# Patient Record
Sex: Female | Born: 1960
Health system: Southern US, Community
[De-identification: ages and names within clinical notes are randomized; demographics above are authoritative.]

## PROBLEM LIST (undated history)

## (undated) DIAGNOSIS — I499 Cardiac arrhythmia, unspecified: Secondary | ICD-10-CM

## (undated) DIAGNOSIS — A048 Other specified bacterial intestinal infections: Secondary | ICD-10-CM

## (undated) DIAGNOSIS — D649 Anemia, unspecified: Secondary | ICD-10-CM

## (undated) DIAGNOSIS — I471 Supraventricular tachycardia, unspecified: Secondary | ICD-10-CM

## (undated) DIAGNOSIS — F32A Depression, unspecified: Secondary | ICD-10-CM

## (undated) DIAGNOSIS — F419 Anxiety disorder, unspecified: Secondary | ICD-10-CM

## (undated) DIAGNOSIS — Z9289 Personal history of other medical treatment: Secondary | ICD-10-CM

## (undated) DIAGNOSIS — I10 Essential (primary) hypertension: Secondary | ICD-10-CM

## (undated) DIAGNOSIS — F329 Major depressive disorder, single episode, unspecified: Secondary | ICD-10-CM

## (undated) DIAGNOSIS — K219 Gastro-esophageal reflux disease without esophagitis: Secondary | ICD-10-CM

## (undated) DIAGNOSIS — M270 Developmental disorders of jaws: Secondary | ICD-10-CM

## (undated) HISTORY — PX: PLEURAL SCARIFICATION: SHX748

## (undated) HISTORY — DX: Personal history of other medical treatment: Z92.89

## (undated) HISTORY — DX: Other specified bacterial intestinal infections: A04.8

## (undated) HISTORY — DX: Anxiety disorder, unspecified: F41.9

## (undated) HISTORY — DX: Cardiac arrhythmia, unspecified: I49.9

## (undated) HISTORY — DX: Essential (primary) hypertension: I10

## (undated) HISTORY — DX: Developmental disorders of jaws: M27.0

## (undated) HISTORY — DX: Gastro-esophageal reflux disease without esophagitis: K21.9

## (undated) HISTORY — DX: Major depressive disorder, single episode, unspecified: F32.9

## (undated) HISTORY — DX: Depression, unspecified: F32.A

## (undated) HISTORY — DX: Anemia, unspecified: D64.9

---

## 1998-02-24 ENCOUNTER — Inpatient Hospital Stay (HOSPITAL_COMMUNITY): Admission: AD | Admit: 1998-02-24 | Discharge: 1998-02-24 | Payer: Self-pay | Admitting: Obstetrics & Gynecology

## 1998-03-31 ENCOUNTER — Ambulatory Visit (HOSPITAL_COMMUNITY): Admission: RE | Admit: 1998-03-31 | Discharge: 1998-03-31 | Payer: Self-pay | Admitting: Obstetrics & Gynecology

## 1998-05-11 ENCOUNTER — Inpatient Hospital Stay (HOSPITAL_COMMUNITY): Admission: AD | Admit: 1998-05-11 | Discharge: 1998-05-14 | Payer: Self-pay | Admitting: Obstetrics and Gynecology

## 1998-05-20 ENCOUNTER — Emergency Department (HOSPITAL_COMMUNITY): Admission: EM | Admit: 1998-05-20 | Discharge: 1998-05-20 | Payer: Self-pay | Admitting: Emergency Medicine

## 1998-05-24 ENCOUNTER — Inpatient Hospital Stay (HOSPITAL_COMMUNITY): Admission: EM | Admit: 1998-05-24 | Discharge: 1998-05-29 | Payer: Self-pay | Admitting: Urology

## 1998-06-12 ENCOUNTER — Emergency Department (HOSPITAL_COMMUNITY): Admission: EM | Admit: 1998-06-12 | Discharge: 1998-06-12 | Payer: Self-pay | Admitting: Emergency Medicine

## 1998-07-28 ENCOUNTER — Ambulatory Visit (HOSPITAL_COMMUNITY): Admission: RE | Admit: 1998-07-28 | Discharge: 1998-07-28 | Payer: Self-pay | Admitting: *Deleted

## 1999-12-20 ENCOUNTER — Ambulatory Visit (HOSPITAL_COMMUNITY): Admission: RE | Admit: 1999-12-20 | Discharge: 1999-12-20 | Payer: Self-pay | Admitting: *Deleted

## 1999-12-20 ENCOUNTER — Encounter: Payer: Self-pay | Admitting: *Deleted

## 2000-03-21 ENCOUNTER — Inpatient Hospital Stay (HOSPITAL_COMMUNITY): Admission: EM | Admit: 2000-03-21 | Discharge: 2000-03-27 | Payer: Self-pay | Admitting: Emergency Medicine

## 2000-03-21 ENCOUNTER — Encounter (INDEPENDENT_AMBULATORY_CARE_PROVIDER_SITE_OTHER): Payer: Self-pay | Admitting: Specialist

## 2000-03-21 ENCOUNTER — Encounter: Payer: Self-pay | Admitting: Emergency Medicine

## 2000-03-22 ENCOUNTER — Encounter: Payer: Self-pay | Admitting: Thoracic Surgery (Cardiothoracic Vascular Surgery)

## 2000-03-23 ENCOUNTER — Encounter: Payer: Self-pay | Admitting: Thoracic Surgery (Cardiothoracic Vascular Surgery)

## 2000-03-24 ENCOUNTER — Encounter: Payer: Self-pay | Admitting: Thoracic Surgery (Cardiothoracic Vascular Surgery)

## 2000-03-25 ENCOUNTER — Encounter: Payer: Self-pay | Admitting: Thoracic Surgery (Cardiothoracic Vascular Surgery)

## 2000-03-26 ENCOUNTER — Encounter: Payer: Self-pay | Admitting: Thoracic Surgery (Cardiothoracic Vascular Surgery)

## 2000-03-27 ENCOUNTER — Encounter: Payer: Self-pay | Admitting: Thoracic Surgery (Cardiothoracic Vascular Surgery)

## 2000-04-09 ENCOUNTER — Encounter: Payer: Self-pay | Admitting: Thoracic Surgery (Cardiothoracic Vascular Surgery)

## 2000-04-09 ENCOUNTER — Encounter
Admission: RE | Admit: 2000-04-09 | Discharge: 2000-04-09 | Payer: Self-pay | Admitting: Thoracic Surgery (Cardiothoracic Vascular Surgery)

## 2000-09-04 ENCOUNTER — Other Ambulatory Visit: Admission: RE | Admit: 2000-09-04 | Discharge: 2000-09-04 | Payer: Self-pay | Admitting: *Deleted

## 2000-09-15 ENCOUNTER — Emergency Department (HOSPITAL_COMMUNITY): Admission: EM | Admit: 2000-09-15 | Discharge: 2000-09-15 | Payer: Self-pay

## 2001-01-25 ENCOUNTER — Other Ambulatory Visit: Admission: RE | Admit: 2001-01-25 | Discharge: 2001-01-25 | Payer: Self-pay | Admitting: *Deleted

## 2001-01-25 ENCOUNTER — Encounter (INDEPENDENT_AMBULATORY_CARE_PROVIDER_SITE_OTHER): Payer: Self-pay | Admitting: Specialist

## 2001-06-12 ENCOUNTER — Observation Stay (HOSPITAL_COMMUNITY): Admission: RE | Admit: 2001-06-12 | Discharge: 2001-06-13 | Payer: Self-pay | Admitting: *Deleted

## 2001-06-12 ENCOUNTER — Encounter (INDEPENDENT_AMBULATORY_CARE_PROVIDER_SITE_OTHER): Payer: Self-pay | Admitting: Specialist

## 2001-08-31 ENCOUNTER — Emergency Department (HOSPITAL_COMMUNITY): Admission: EM | Admit: 2001-08-31 | Discharge: 2001-08-31 | Payer: Self-pay | Admitting: Emergency Medicine

## 2001-09-18 ENCOUNTER — Other Ambulatory Visit: Admission: RE | Admit: 2001-09-18 | Discharge: 2001-09-18 | Payer: Self-pay | Admitting: *Deleted

## 2002-10-20 ENCOUNTER — Emergency Department (HOSPITAL_COMMUNITY): Admission: EM | Admit: 2002-10-20 | Discharge: 2002-10-20 | Payer: Self-pay | Admitting: Emergency Medicine

## 2002-11-19 ENCOUNTER — Encounter: Payer: Self-pay | Admitting: *Deleted

## 2002-11-19 ENCOUNTER — Ambulatory Visit (HOSPITAL_COMMUNITY): Admission: RE | Admit: 2002-11-19 | Discharge: 2002-11-19 | Payer: Self-pay | Admitting: *Deleted

## 2003-01-25 ENCOUNTER — Emergency Department (HOSPITAL_COMMUNITY): Admission: EM | Admit: 2003-01-25 | Discharge: 2003-01-25 | Payer: Self-pay | Admitting: Emergency Medicine

## 2003-12-05 HISTORY — PX: ABDOMINAL HYSTERECTOMY: SHX81

## 2004-01-18 ENCOUNTER — Ambulatory Visit (HOSPITAL_COMMUNITY): Admission: RE | Admit: 2004-01-18 | Discharge: 2004-01-18 | Payer: Self-pay | Admitting: *Deleted

## 2004-04-07 ENCOUNTER — Other Ambulatory Visit: Admission: RE | Admit: 2004-04-07 | Discharge: 2004-04-07 | Payer: Self-pay | Admitting: Family Medicine

## 2004-04-21 ENCOUNTER — Emergency Department (HOSPITAL_COMMUNITY): Admission: EM | Admit: 2004-04-21 | Discharge: 2004-04-22 | Payer: Self-pay | Admitting: Emergency Medicine

## 2004-04-30 ENCOUNTER — Ambulatory Visit (HOSPITAL_COMMUNITY): Admission: RE | Admit: 2004-04-30 | Discharge: 2004-04-30 | Payer: Self-pay | Admitting: Emergency Medicine

## 2004-05-19 ENCOUNTER — Ambulatory Visit (HOSPITAL_COMMUNITY): Admission: RE | Admit: 2004-05-19 | Discharge: 2004-05-19 | Payer: Self-pay | Admitting: Neurosurgery

## 2006-08-15 ENCOUNTER — Ambulatory Visit: Payer: Self-pay | Admitting: Family Medicine

## 2008-01-21 LAB — HM COLONOSCOPY: HM Colonoscopy: NORMAL

## 2008-01-24 ENCOUNTER — Encounter: Admission: RE | Admit: 2008-01-24 | Discharge: 2008-01-24 | Payer: Self-pay | Admitting: Gastroenterology

## 2008-03-04 ENCOUNTER — Other Ambulatory Visit: Admission: RE | Admit: 2008-03-04 | Discharge: 2008-03-04 | Payer: Self-pay | Admitting: Obstetrics and Gynecology

## 2008-08-26 LAB — CONVERTED CEMR LAB: Pap Smear: NORMAL

## 2009-01-21 ENCOUNTER — Ambulatory Visit: Payer: Self-pay | Admitting: *Deleted

## 2009-01-21 DIAGNOSIS — I10 Essential (primary) hypertension: Secondary | ICD-10-CM | POA: Insufficient documentation

## 2009-01-21 DIAGNOSIS — G43009 Migraine without aura, not intractable, without status migrainosus: Secondary | ICD-10-CM

## 2009-01-21 DIAGNOSIS — F411 Generalized anxiety disorder: Secondary | ICD-10-CM | POA: Insufficient documentation

## 2009-01-25 ENCOUNTER — Ambulatory Visit: Payer: Self-pay | Admitting: *Deleted

## 2009-01-25 DIAGNOSIS — J069 Acute upper respiratory infection, unspecified: Secondary | ICD-10-CM | POA: Insufficient documentation

## 2009-01-28 ENCOUNTER — Ambulatory Visit: Payer: Self-pay | Admitting: *Deleted

## 2009-01-28 DIAGNOSIS — M278 Other specified diseases of jaws: Secondary | ICD-10-CM

## 2009-02-03 ENCOUNTER — Ambulatory Visit: Payer: Self-pay | Admitting: *Deleted

## 2009-02-03 DIAGNOSIS — D649 Anemia, unspecified: Secondary | ICD-10-CM | POA: Insufficient documentation

## 2009-02-03 LAB — CONVERTED CEMR LAB
AST: 23 units/L (ref 0–37)
Alkaline Phosphatase: 68 units/L (ref 39–117)
BUN: 10 mg/dL (ref 6–23)
Basophils Relative: 0.5 % (ref 0.0–3.0)
Creatinine, Ser: 0.7 mg/dL (ref 0.4–1.2)
Eosinophils Absolute: 0.1 10*3/uL (ref 0.0–0.7)
Eosinophils Relative: 1.7 % (ref 0.0–5.0)
HCT: 39.7 % (ref 36.0–46.0)
HDL: 51.8 mg/dL (ref >39.0)
MCV: 79.1 fL (ref 78.0–100.0)
Monocytes Absolute: 0.4 10*3/uL (ref 0.1–1.0)
Platelets: 294 10*3/uL (ref 150–400)
RBC: 5.02 M/uL (ref 3.87–5.11)
TSH: 0.44 microintl units/mL (ref 0.35–5.50)
WBC: 7.4 10*3/uL (ref 4.5–10.5)

## 2009-03-08 ENCOUNTER — Other Ambulatory Visit: Admission: RE | Admit: 2009-03-08 | Discharge: 2009-03-08 | Payer: Self-pay | Admitting: Obstetrics and Gynecology

## 2009-03-08 ENCOUNTER — Encounter: Payer: Self-pay | Admitting: Women's Health

## 2009-03-08 ENCOUNTER — Ambulatory Visit: Payer: Self-pay | Admitting: Women's Health

## 2009-03-24 ENCOUNTER — Encounter: Payer: Self-pay | Admitting: Internal Medicine

## 2009-04-06 ENCOUNTER — Telehealth: Payer: Self-pay | Admitting: Internal Medicine

## 2009-11-17 ENCOUNTER — Ambulatory Visit: Payer: Self-pay | Admitting: Family

## 2009-11-17 DIAGNOSIS — R300 Dysuria: Secondary | ICD-10-CM

## 2009-11-17 DIAGNOSIS — F329 Major depressive disorder, single episode, unspecified: Secondary | ICD-10-CM

## 2009-11-19 ENCOUNTER — Encounter: Payer: Self-pay | Admitting: Family

## 2009-11-23 ENCOUNTER — Ambulatory Visit (HOSPITAL_BASED_OUTPATIENT_CLINIC_OR_DEPARTMENT_OTHER): Admission: RE | Admit: 2009-11-23 | Discharge: 2009-11-23 | Payer: Self-pay | Admitting: Internal Medicine

## 2009-11-23 ENCOUNTER — Ambulatory Visit: Payer: Self-pay | Admitting: Radiology

## 2010-01-19 ENCOUNTER — Ambulatory Visit: Payer: Self-pay | Admitting: Family

## 2010-01-19 ENCOUNTER — Telehealth: Payer: Self-pay | Admitting: Family

## 2010-01-19 LAB — CONVERTED CEMR LAB
Basophils Absolute: 0 10*3/uL (ref 0.0–0.1)
Cholesterol: 191 mg/dL (ref 0–200)
Glucose, Bld: 82 mg/dL (ref 70–99)
HDL: 58 mg/dL (ref >39)
Hemoglobin: 13.3 g/dL (ref 12.0–15.0)
Lymphocytes Relative: 23 % (ref 12–46)
Monocytes Absolute: 0.4 10*3/uL (ref 0.1–1.0)
Neutro Abs: 5 10*3/uL (ref 1.7–7.7)
Potassium: 4.3 meq/L (ref 3.5–5.3)
RBC: 5.3 M/uL — ABNORMAL HIGH (ref 3.87–5.11)
RDW: 15.9 % — ABNORMAL HIGH (ref 11.5–15.5)
Sodium: 141 meq/L (ref 135–145)
Total CHOL/HDL Ratio: 3.3
Triglycerides: 46 mg/dL (ref <150)
VLDL: 9 mg/dL (ref 0–40)

## 2010-01-20 ENCOUNTER — Encounter: Payer: Self-pay | Admitting: Family

## 2010-01-20 LAB — CONVERTED CEMR LAB: UIBC: 248 ug/dL

## 2010-01-21 ENCOUNTER — Encounter: Payer: Self-pay | Admitting: Internal Medicine

## 2010-01-21 ENCOUNTER — Ambulatory Visit: Payer: Self-pay | Admitting: Family

## 2010-02-28 ENCOUNTER — Telehealth: Payer: Self-pay | Admitting: Family

## 2010-04-01 ENCOUNTER — Encounter: Payer: Self-pay | Admitting: Family

## 2010-04-01 ENCOUNTER — Telehealth: Payer: Self-pay | Admitting: Family

## 2010-04-15 ENCOUNTER — Ambulatory Visit: Payer: Self-pay | Admitting: Family

## 2010-07-07 ENCOUNTER — Telehealth: Payer: Self-pay | Admitting: Family

## 2010-08-26 ENCOUNTER — Telehealth: Payer: Self-pay | Admitting: Family

## 2010-12-27 ENCOUNTER — Ambulatory Visit (INDEPENDENT_AMBULATORY_CARE_PROVIDER_SITE_OTHER)
Admission: RE | Admit: 2010-12-27 | Discharge: 2010-12-27 | Payer: PRIVATE HEALTH INSURANCE | Source: Home / Self Care | Attending: Internal Medicine | Admitting: Internal Medicine

## 2010-12-27 DIAGNOSIS — Z1231 Encounter for screening mammogram for malignant neoplasm of breast: Secondary | ICD-10-CM

## 2010-12-27 LAB — HM MAMMOGRAPHY: HM Mammogram: NORMAL

## 2011-01-01 LAB — CONVERTED CEMR LAB
Bilirubin Urine: NEGATIVE
Ketones, urine, test strip: NEGATIVE
pH: 6.5

## 2011-01-05 NOTE — Assessment & Plan Note (Signed)
Summary: t b test/mhf  Nurse Visit   Allergies: No Known Drug Allergies  Immunizations Administered:  PPD Skin Test:    Vaccine Type: PPD    Site: left forearm    Mfr: Sanofi Pasteur    Dose: 0.1 ml    Route: ID    Given by: Glendell Docker CMA    Exp. Date: 02/09/2012    Lot #: [id number]  Orders Added: 1)  TB Skin Test [86580] 2)  Admin 1st Vaccine [phone]

## 2011-01-05 NOTE — Letter (Signed)
   Adult nurse [facility]Care at [facility] [address] [place], [place] [address]     January 20, 2010   [facility] [address] [place], [place] [address]  RE:  LAB RESULTS  Dear  Ms. SHARPE,  The following is an interpretation of your most recent lab tests.  Please take note of any instructions provided or changes to medications that have resulted from your lab work.  ELECTROLYTES:  Good - no changes needed  KIDNEY FUNCTION TESTS:  Good - no changes needed    CBC:  Stable - no changes needed Your iron may be low based on your blood work.  I am running some additional tests to check.   Sincerely Yours,    Lemont Fillers FNP

## 2011-01-05 NOTE — Letter (Signed)
Summary: Generic Letter  [place] [facility]  [address]   [place], [place] [address]   Phone: [phone]  Fax: [phone]      04/01/2010    [facility] [address] [place], [place]  [address]    Dear [name],  We recently received a refill request from your pharmacy for Bystolic. After reviewing your records it was noted that you have not followed up in the office as I requested when I saw you in December. We will be unable to refill your medication until you can be seen in the office. Keeping regularly scheduled appointments is an important step in managing your hypertension.  Please call our office at [phone] to schedule your appointment today.  Sincerely,    Sandford Craze, NP

## 2011-01-05 NOTE — Letter (Signed)
Summary: Physical Exam Form/[employer]  Physical Exam Form/[employer]   Imported By: Lanelle Bal 01/25/2010 12:35:34  _____________________________________________________________________  External Attachment:    Type:   Image     Comment:   External Document

## 2011-01-05 NOTE — Progress Notes (Signed)
Summary: samples  Phone Note Call from Patient Call back at [phone]   Caller: Patient Call For: osullivan Summary of Call: patient needs samples of bystolic 20 mg or 10 mg  Initial call taken by: Roselle Locus,  August 26, 2010 11:09 AM  Follow-up for Phone Call        samples left at front desk fo 10mg , #28.  Also provided coupon card.  Patient was contacted and notified that samples and card left at front desk- patient to take two tabs. Follow-up by: Lemont Fillers FNP,  August 26, 2010 12:23 PM

## 2011-01-05 NOTE — Progress Notes (Signed)
Summary: refill request  Phone Note Refill Request Message from:  Fax from Pharmacy on February 28, 2010 8:09 AM  Refills Requested: Medication #1:  BYSTOLIC 20 MG TABS one tablet by mouth daily.   Dosage confirmed as above?Dosage Confirmed   Brand Name Necessary? No   Supply Requested: 1 month   Last Refilled: 01/23/2010 [facility] [address] [place] [address] phone [phone] fax [phone]   Method Requested: Electronic Next Appointment Scheduled: none Initial call taken by: Roselle Locus,  February 28, 2010 8:10 AM  Follow-up for Phone Call        Pt. was supposed to have followed up with [facility] in January but did not. Refilled Bystolic #30 x no refills. Attempted to contact pt. but number is not working. Note sent to pharmacy that pt. must make appt. before further refills can be given. Nicki Guadalajara Fergerson CMA  February 28, 2010 8:39 AM     Prescriptions: BYSTOLIC 20 MG TABS (NEBIVOLOL HCL) one tablet by mouth daily  #30 x 0   Entered by:   Mervin Kung CMA   Authorized by:   Lemont Fillers FNP   Signed by:   Mervin Kung CMA on 02/28/2010   Method used:   Electronically to        [id number] (retail)       [address] [place], [place]  [address]       Ph: [phone]       Fax: [phone]   RxID:   [id number]

## 2011-01-05 NOTE — Progress Notes (Signed)
Summary: Bystolic samples  Phone Note Call from Patient Call back at [phone]   Caller: Patient Summary of Call: pt wants to know if we have any samples of Bystolic 20MG ,  pls call Initial call taken by: Lannette Donath,  July 07, 2010 4:36 PM  Follow-up for Phone Call        Pt advised we are out of 20mg  strength. I will leave 10mg  strength at front desk, pt advised to take 2 daily to equal 20mg .  Pt voices understanding.  #35 samples given Lot [id number] 10/13.  Nicki Guadalajara Fergerson CMA (AAMA)  July 08, 2010 11:01 AM

## 2011-01-05 NOTE — Assessment & Plan Note (Signed)
Summary: READ TB TEST/MHF  Nurse Visit   Primary Care Provider:  Paulo Fruit MD  CC:  TB Skin test recheck.  History of Present Illness: The patient presented after 48 hours to check  the injection site for postive or negative reaction.  Injection site examination : No firm bump frms at the test site. Slighty reddish appearance and diameter was smaller than 5mm.  Assessment & Plan: Negative TB skin test. Patient was counseled to call if experienceany irritation of site.     Allergies: No Known Drug Allergies  PPD Results    Date of reading: 01/21/2010    Results: < 5mm    Interpretation: negative   Appended Document: Tetanus    Clinical Lists Changes  Orders: Added new Service order of Tdap => 91yrs IM ([address]) - Signed Added new Service order of Admin 1st Vaccine ([address]) - Signed Observations: Added new observation of TD BOOST VIS: 10/22/07 version given January 21, 2010. (01/21/2010 10:50) Added new observation of TD BOOSTERLO: [id number] (01/21/2010 10:50) Added new observation of TD BOOST EXP: 01/29/2012 (01/21/2010 10:50) Added new observation of TD BOOSTERBY: Mervin Kung, CMA (01/21/2010 10:50) Added new observation of TD BOOSTERRT: IM (01/21/2010 10:50) Added new observation of TDBOOSTERDSE: 0.5 ml (01/21/2010 10:50) Added new observation of TD BOOSTERMF: GlaxoSmithKline (01/21/2010 10:50) Added new observation of TD BOOST SIT: left deltoid (01/21/2010 10:50) Added new observation of TD BOOSTER: Tdap (01/21/2010 10:50)       Immunizations Administered:  Tetanus Vaccine:    Vaccine Type: Tdap    Site: left deltoid    Mfr: GlaxoSmithKline    Dose: 0.5 ml    Route: IM    Given by: Mervin Kung, CMA    Exp. Date: 01/29/2012    Lot #: [id number]    VIS given: 10/22/07 version given January 21, 2010.

## 2011-01-05 NOTE — Progress Notes (Signed)
  Phone Note Outgoing Call   Summary of Call: Please call patient and let her know that in order to complete form she will need to return for a nurse visit for hearing, vision, PPD and possibly tetanus- we need to verify date of last dose. Thanks. Initial call taken by: Lemont Fillers FNP,  January 19, 2010 4:38 PM  Follow-up for Phone Call        patient had ppd done 01/19/10 and will be back to have it read tomorrow so will do the others at this time....Marland KitchenMarland KitchenDoristine Devoid  January 20, 2010 2:35 PM

## 2011-01-05 NOTE — Progress Notes (Signed)
Summary: refill request-- Bystolic  Phone Note Refill Request Message from:  Pharmacy on April 01, 2010 11:14 AM  Refills Requested: Medication #1:  BYSTOLIC 20 MG TABS one tablet by mouth daily.   Dosage confirmed as above?Dosage Confirmed   Supply Requested: 1 month   Last Refilled: 02/28/2010 Spoke to [place] at [facility] and denied refill as pt. needs to contact our office for an appointment. Unable to reach pt. at contact numbers listed.   Mervin Kung CMA  April 01, 2010 11:17 AM   Next Appointment Scheduled: no appt. on file  Follow-up for Phone Call        Pls send patient a letter instructing her to arrange a follow up visit. Thanks Follow-up by: Lemont Fillers FNP,  April 01, 2010 11:23 AM  Additional Follow-up for Phone Call Additional follow up Details #1::        Letter mailed to pt.  Mervin Kung CMA  April 01, 2010 3:08 PM

## 2011-01-05 NOTE — Assessment & Plan Note (Signed)
Summary: b p med refill/mhf   Vital Signs:  Patient profile:   50 year old female Menstrual status:  hysterectomy Height:      70 inches Weight:      159.50 pounds BMI:     22.97 Temp:     98.2 degrees F oral Pulse rate:   66 / minute Pulse rhythm:   regular Resp:     16 per minute BP sitting:   110 / 78  (right arm) Cuff size:   regular  Vitals Entered By: Mervin Kung CMA (Apr 15, 2010 10:10 AM) CC: room 5  follow up of blood pressure. Is Patient Diabetic? No   Primary Care Provider:  Paulo Fruit MD  CC:  room 5  follow up of blood pressure.Marland Kitchen  History of Present Illness: [name] is a 50 year old female who presents today for follow up of her HTN.  She denies current headache or swelling.  Reports that her Bystolic ran out and she was out for approximately 1 week.  Has not been following up due to lack of insurance.  She went to the Urgent care to obtain rx for Bystolic.  She has no specific complaints.  Allergies (verified): No Known Drug Allergies  Physical Exam  General:  Well-developed,well-nourished,in no acute distress; alert,appropriate and cooperative throughout examination Lungs:  Normal respiratory effort, chest expands symmetrically. Lungs are clear to auscultation, no crackles or wheezes. Heart:  Normal rate and regular rhythm. S1 and S2 normal without gallop, murmur, click, rub or other extra sounds. Extremities:  No clubbing, cyanosis, edema, or deformity noted    Impression & Recommendations:  Problem # 1:  HYPERTENSION (ICD-401.9) Assessment Improved  samples given [id number] exp 08/13 #28, BP stable, plan follow up in 6 months. Her updated medication list for this problem includes:    Bystolic 20 Mg Tabs (Nebivolol hcl) ..... One tablet by mouth daily  BP today: 110/78 Prior BP: 130/90 (11/17/2009)  Labs Reviewed: K+: 4.3 (01/19/2010) Creat: : 0.82 (01/19/2010)   Chol: 191 (01/19/2010)   HDL: 58 (01/19/2010)   LDL: 124 (01/19/2010)   TG:  46 (01/19/2010)  Complete Medication List: 1)  Bystolic 20 Mg Tabs (Nebivolol hcl) .... One tablet by mouth daily  Patient Instructions: 1)  Please follow up in 6 months Prescriptions: BYSTOLIC 20 MG TABS (NEBIVOLOL HCL) one tablet by mouth daily  #30 x 5   Entered and Authorized by:   Lemont Fillers FNP   Signed by:   Lemont Fillers FNP on 04/15/2010   Method used:   Electronically to        [id number]* (retail)       [address] [place], [place]  [address]       Ph: [phone]       Fax: [phone]   RxID:   [id number]   Current Allergies (reviewed today): No known allergies

## 2011-01-26 ENCOUNTER — Telehealth: Payer: Self-pay | Admitting: Family

## 2011-01-31 NOTE — Progress Notes (Signed)
Summary: refill-bystolic  Phone Note Refill Request Message from:  Fax from Pharmacy on January 26, 2011 8:58 AM  Refills Requested: Medication #1:  BYSTOLIC 20 MG TABS one tablet by mouth daily.   Dosage confirmed as above?Dosage Confirmed   Brand Name Necessary? No   Supply Requested: 1 month   Last [facility]: [facility] [address] [place] [address] fax [phone]   Method Requested: Electronic Next Appointment Scheduled: none Initial call taken by: Elba Barman,  January 26, 2011 9:03 AM  Follow-up for Phone Call        Pt is past due for follow up. Attempted to reach pt and received recording that voice mail box has not been set up. Notified Sheria Lang at [facility] that rx has been denied, have pt contact our office. Pt needs follow up, can give 30 day supply only once pt schedules appt.  Follow-up by: Mervin Kung CMA Duncan Dull),  January 26, 2011 9:52 AM

## 2011-02-01 ENCOUNTER — Telehealth: Payer: Self-pay | Admitting: Family

## 2011-02-02 ENCOUNTER — Encounter: Payer: Self-pay | Admitting: Internal Medicine

## 2011-02-02 ENCOUNTER — Ambulatory Visit (INDEPENDENT_AMBULATORY_CARE_PROVIDER_SITE_OTHER): Payer: PRIVATE HEALTH INSURANCE | Admitting: Internal Medicine

## 2011-02-02 DIAGNOSIS — J069 Acute upper respiratory infection, unspecified: Secondary | ICD-10-CM

## 2011-02-02 DIAGNOSIS — I10 Essential (primary) hypertension: Secondary | ICD-10-CM

## 2011-02-09 NOTE — Progress Notes (Signed)
Summary: congestion, needs appt.  Phone Note Call from Patient Call back at [phone]--Melvin   Caller: Spouse Call For: Lemont Fillers FNP Summary of Call: Received call from pt's husband stating that pt has head congestion, cough and head pressure. Wanted to know what to try OTC. Advised him pt would need to make an appointment (pt past due for f/u). He states he will let pt know when he gets home. Nicki Guadalajara Fergerson CMA Duncan Dull)  February 01, 2011 1:43 PM

## 2011-02-09 NOTE — Assessment & Plan Note (Signed)
Summary: cough/head pressure/head congestion/ss--rm 3   Vital Signs:  Patient profile:   50 year old female Menstrual status:  hysterectomy Height:      70 inches Weight:      161 pounds BMI:     23.18 O2 Sat:      98 % on Room air Temp:     98.6 degrees F oral Pulse rate:   72 / minute Pulse rhythm:   regular Resp:     16 per minute BP sitting:   108 / 80  (right arm) Cuff size:   regular  Vitals Entered By: Mervin Kung CMA Duncan Dull) (February 02, 2011 10:07 AM)  O2 Flow:  Room air CC: Pt states she has had dry cough, head congestion and dizziness since Monday. Is Patient Diabetic? No Pain Assessment Patient in pain? no      Comments Pt out of Bystolic x 2 days, needs refill. Has tried Nyquil with minimal relief. Nicki Guadalajara Fergerson CMA Duncan Dull)  February 02, 2011 10:14 AM    Primary Care Provider:  Lemont Fillers FNP  CC:  Pt states she has had dry cough and head congestion and dizziness since Monday.Marland Kitchen  History of Present Illness: 50 y/o female c/o URI symptoms started with itchy throat head congestion significant fatigue 2 days ago - felt chills some cough - due to itch in the throat no ear pain no SOB  Preventive Screening-Counseling & Management  Alcohol-Tobacco     Smoking Status: never     Passive Smoke Exposure: no  Allergies (verified): No Known Drug Allergies  Past History:  Past Medical History: migraines  sees gyn Hypertension irregular heart rhythm - unclear what arrhythmia  pneumothorax palatal torus Depression  Urninary tract infections  Past Surgical History: c-section x 2    pneumothorax Hysterectomy 2005    Family History: Family History of Arthritis    Family History of  of CAD    Family History of  stroke    Family History of  emotional/mental illness  Mom-depression Dad-hypertension   Social History: Social History:    Occupation:not currently work    Married    2 children 11 and 14     Never Smoked    Alcohol  use-no  Physical Exam  General:  alert, well-developed, and well-nourished.   Ears:  right cerumen,  Left ear normal Mouth:  no exudates and pharyngeal erythema.   Neck:  No deformities, masses, or tenderness noted. Lungs:  normal respiratory effort, normal breath sounds, no crackles, and no wheezes.   Heart:  normal rate, regular rhythm, and no gallop.     Impression & Recommendations:  Problem # 1:  URI (ICD-465.9)  Instructed on symptomatic treatment. Call if symptoms persist or worsen.   Problem # 2:  HYPERTENSION (ICD-401.9) pt has not taken bp meds x 2 days bp still normal.    we discussed how bp may be lower due to recent URI hold bystolic for additional 24 hrs increase fluids monitor bp at home once bp returns to baseline, pt to restart bystolic but at 1/2 dose  Her updated medication list for this problem includes:    Bystolic 20 Mg Tabs (Nebivolol hcl) ..... One half tablet by mouth daily  BP today: 108/80 Prior BP: 110/78 (04/15/2010)  Labs Reviewed: K+: 4.3 (01/19/2010) Creat: : 0.82 (01/19/2010)   Chol: 191 (01/19/2010)   HDL: 58 (01/19/2010)   LDL: 124 (01/19/2010)   TG: 46 (01/19/2010)  Complete Medication List: 1)  Bystolic 20 Mg Tabs (Nebivolol hcl) .... One half tablet by mouth daily  Patient Instructions: 1)  Call our office if your symptoms do not  improve or gets worse. 2)  Please follow up with Melissa within 3-6 months 3)  Increase fluids Prescriptions: BYSTOLIC 20 MG TABS (NEBIVOLOL HCL) one tablet by mouth daily  #30 x 5   Entered and Authorized by:   D. Thomos Lemons DO   Signed by:   D. Thomos Lemons DO on 02/02/2011   Method used:   Electronically to        [id number] (retail)       [address] [place], [place]  [address]       Ph: [phone]       Fax: [phone]   RxID:   [id number]    Orders Added: 1)  Est. Patient Level III [[address]]    Current Allergies (reviewed today): No known  allergies

## 2011-02-22 ENCOUNTER — Encounter: Payer: Self-pay | Admitting: Family

## 2011-02-24 ENCOUNTER — Encounter: Payer: Self-pay | Admitting: Family

## 2011-02-24 ENCOUNTER — Ambulatory Visit (INDEPENDENT_AMBULATORY_CARE_PROVIDER_SITE_OTHER): Payer: PRIVATE HEALTH INSURANCE | Admitting: Family

## 2011-02-24 DIAGNOSIS — I10 Essential (primary) hypertension: Secondary | ICD-10-CM

## 2011-02-24 NOTE — Progress Notes (Signed)
  Subjective:    Patient ID: [name], female    DOB: [birth year], 50 y.o.   MRN: [id number]  HPI Patient is a 50 year old female who presents today for followup of her hypertension. Last visit she was noted to have low blood pressure. This was during an acute illness and she was not taking her systolic at that time. It was recommended that she monitor her blood pressure and increase her diastolic to half of the dose which was 10 mg. She notes that she discontinued off of the basilic and her blood pressure has remained stable. She brings with her today a blood pressure log.  Her readings since March 18 note systolic pressure ranging from 94-132 and diastolic pressure ranging from 68-89.    Review of Systems  Cardiovascular: Positive for palpitations. Negative for leg swelling.  Neurological: Positive for light-headedness.       Occurs only when blood pressure is low.   Past Medical History  Diagnosis Date  . Hypertension   . Depression   . Migraine   . Irregular heart rhythm     unclear what arrhythmia  . Pneumothorax   . Torus palatinus   . UTI (urinary tract infection)     History   Social History  . Marital Status: Married    Spouse Name: N/A    Number of Children: 2  . Years of Education: N/A   Occupational History  . SALES    Social History Main Topics  . Smoking status: Never Smoker   . Smokeless tobacco: Never Used  . Alcohol Use: No  . Drug Use: Not on file  . Sexually Active: Not on file   Other Topics Concern  . Not on file   Social History Narrative  . No narrative on file    Past Surgical History  Procedure Date  . Cesarean section     x 2  . Abdominal hysterectomy 2005  . Pleural scarification     Family History  Problem Relation Age of Onset  . Depression Mother   . Hypertension Father   . Arthritis Other   . Coronary artery disease Other   . Stroke Other   . Mental illness Other     Allergies no known allergies  No current  outpatient prescriptions on file prior to visit.    BP 140/84  Pulse 62  Temp(Src) 98.2 F (36.8 C) (Oral)  Resp 14  Ht 5\' 10"  (1.778 m)  Wt 161 lb (73.029 kg)  BMI 23.10 kg/m2       Objective:   Physical Exam  Constitutional: She appears well-developed and well-nourished.  Cardiovascular: Normal rate and regular rhythm.   Pulmonary/Chest: No respiratory distress. She has no wheezes. She has no rales.          Assessment & Plan:

## 2011-02-24 NOTE — Patient Instructions (Signed)
Please continue to hold the Bystolic.   Arrange a follow up appointment with Dr. Lynnea Ferrier (cardiolgy). Call if your blood pressure runs consistently > 140/90.

## 2011-02-24 NOTE — Assessment & Plan Note (Signed)
Patient's blood pressure appears to be well controlled off of medications. I have recommended that she continue to monitor her blood pressure regularly at home and call us if her blood pressure is regularly running graded 140/90. She does tell me that her cardiologist Dr. Lynnea Ferrier and initiated this medicine. She has a history of palpitations. She does note that they happen on occasion but this has not been a part of particular concern to her. I did recommend that she call Dr. Lynnea Ferrier to arrange a followup visit to discuss ongoing discontinuation of her beta blocker. She is agreeable to do so.

## 2011-03-24 ENCOUNTER — Emergency Department (HOSPITAL_COMMUNITY): Payer: PRIVATE HEALTH INSURANCE

## 2011-03-24 ENCOUNTER — Emergency Department (HOSPITAL_COMMUNITY)
Admission: EM | Admit: 2011-03-24 | Discharge: 2011-03-24 | Disposition: A | Discharge status: Home / Self Care | Payer: PRIVATE HEALTH INSURANCE | Attending: Emergency Medicine | Admitting: Emergency Medicine

## 2011-03-24 DIAGNOSIS — R0609 Other forms of dyspnea: Secondary | ICD-10-CM | POA: Insufficient documentation

## 2011-03-24 DIAGNOSIS — I498 Other specified cardiac arrhythmias: Secondary | ICD-10-CM | POA: Insufficient documentation

## 2011-03-24 DIAGNOSIS — I10 Essential (primary) hypertension: Secondary | ICD-10-CM | POA: Insufficient documentation

## 2011-03-24 DIAGNOSIS — R110 Nausea: Secondary | ICD-10-CM | POA: Insufficient documentation

## 2011-03-24 DIAGNOSIS — R5381 Other malaise: Secondary | ICD-10-CM | POA: Insufficient documentation

## 2011-03-24 DIAGNOSIS — R42 Dizziness and giddiness: Secondary | ICD-10-CM | POA: Insufficient documentation

## 2011-03-24 DIAGNOSIS — R002 Palpitations: Secondary | ICD-10-CM | POA: Insufficient documentation

## 2011-03-24 DIAGNOSIS — R0989 Other specified symptoms and signs involving the circulatory and respiratory systems: Secondary | ICD-10-CM | POA: Insufficient documentation

## 2011-03-24 LAB — CK TOTAL AND CKMB (NOT AT ARMC)
CK, MB: 3.3 ng/mL (ref 0.3–4.0)
Relative Index: 1.7 (ref 0.0–2.5)

## 2011-03-24 LAB — CBC
HCT: 38.2 % (ref 36.0–46.0)
Hemoglobin: 13.4 g/dL (ref 12.0–15.0)
MCH: 25.7 pg — ABNORMAL LOW (ref 26.0–34.0)
MCHC: 35.1 g/dL (ref 30.0–36.0)
RBC: 5.22 MIL/uL — ABNORMAL HIGH (ref 3.87–5.11)

## 2011-03-24 LAB — DIFFERENTIAL
Blasts: 0 %
Lymphocytes Relative: 57 % — ABNORMAL HIGH (ref 12–46)
Lymphs Abs: 3.7 10*3/uL (ref 0.7–4.0)
Monocytes Absolute: 0.3 10*3/uL (ref 0.1–1.0)
Monocytes Relative: 5 % (ref 3–12)
Neutro Abs: 2.1 10*3/uL (ref 1.7–7.7)
Neutrophils Relative %: 34 % — ABNORMAL LOW (ref 43–77)
Promyelocytes Absolute: 0 %
nRBC: 0 /100 WBC

## 2011-03-24 LAB — BASIC METABOLIC PANEL
CO2: 25 mEq/L (ref 19–32)
Calcium: 9.6 mg/dL (ref 8.4–10.5)
GFR calc Af Amer: >60 mL/min (ref >60)
GFR calc non Af Amer: >60 mL/min (ref >60)
Glucose, Bld: 140 mg/dL — ABNORMAL HIGH (ref 70–99)
Potassium: 3.6 mEq/L (ref 3.5–5.1)
Sodium: 143 mEq/L (ref 135–145)

## 2011-03-24 LAB — TSH: TSH: 0.833 u[IU]/mL (ref 0.350–4.500)

## 2011-03-24 LAB — TROPONIN I: Troponin I: 0.01 ng/mL (ref 0.00–0.06)

## 2011-04-11 NOTE — H&P (Signed)
[name], [name]              ACCOUNT NO.:  [id number]  MEDICAL RECORD NO.:  [id number]           PATIENT TYPE:  E  LOCATION:  [facility]                         FACILITY:  [facility]  PHYSICIAN:  Nanetta Batty, M.D.   DATE OF BIRTH:  12-Jun-1961  DATE OF ADMISSION:  03/24/2011 DATE OF DISCHARGE:  03/24/2011                             HISTORY & PHYSICAL   CHIEF COMPLAINTS:  Tachycardia.  HISTORY OF PRESENT ILLNESS:  [name] is a pleasant 50 year old female who was seen by our group in the past.  She has had a history of chest pain with an abnormal EKG but has had a negative Myoview in 2009 and an essentially normal echocardiogram.  She does have hypertension.  I last saw her in April of 2010.  She had been on Bystolic 10 mg.  Apparently her primary care doctor had increased her Bystolic to 20 mg but then she had problems with hypotension and eventually I believe her Bystolic was stopped about a month ago.  Today, the patient was driving and had a sudden onset of tachycardia associated with dizziness.  In the emergency room, she was noted to be in SVT.  She was given Adenocard which ultimately converted her to sinus rhythm.  She is currently symptom free and maintaining sinus rhythm.  PAST MEDICAL HISTORY:  Remarkable for prior pneumothorax remotely.  She has hypertension as noted.  She has had endometriosis and fibroids.  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  She said she was sent to [facility] for this and did not require surgery.  CURRENT MEDICATIONS:  None.  ALLERGIES:  She has no known drug allergies.  SOCIAL HISTORY:  She is married.  She has 2 boys.  She works as a Scientist, physiological.  She does not smoke or drink and has only a cup of coffee a day.  FAMILY HISTORY:  Remarkable that her father had coronary artery disease.  REVIEW OF SYSTEMS:  Essentially unremarkable except for above.  She denies any chest pain or shortness  of breath.  She has not had syncope.  PHYSICAL EXAMINATION:  VITAL SIGNS:  In the emergency room, vital signs were blood pressure 153/108, pulse 80, respirations 12. GENERAL:  In general, she is a well-developed, well-nourished African- American female, in no acute distress. HEENT:  Normocephalic.  Extraocular movements are intact.  Sclerae nonicteric.  Lids and conjunctivae within normal limits. NECK:  Without JVD or bruit. CHEST:  Clear to auscultation and percussion. CARDIAC:  Reveals regular rate and rhythm without murmur, rub, or gallop.  Normal S1, S2. ABDOMEN:  Nontender, nondistended. EXTREMITIES:  Without edema.  Distal pulses are intact. NEURO EXAM:  Grossly intact.  She is awake, alert, oriented, cooperative.  Moves all extremities without obvious deficit. SKIN:  Cool and dry.  LABORATORY DATA:  Sodium 143, potassium 3.6, BUN 11, creatinine 0.8, troponin is negative.  BNP is 44.  White count 6.3, hemoglobin 13.3, hematocrit 38.2, platelets 300.  EKG shows sinus rhythm with PVCs, she has T-wave inversion in leads II, III and aVF and a biphasic T-wave in  V5 and T-wave inversion in V6, and it also appears that she has incomplete right bundle branch block.  IMPRESSION: 1. Paroxysmal supraventricular tachycardia, broken with an adenosine. 2. Abnormal EKG with negative Myoview in 2009 and normal left     ventricular function by echocardiogram in 2009. 3. Treated hypertension, recently taken off her beta-blocker.  PLAN:  The patient was seen by Dr. Allyson Sabal and myself in the emergency room.  We restarted her by Bystolic at 10 mg a day.  We will see her back in about 2 weeks in the office in followup.  At some point, she will need a TSH, this could be done as an outpatient.     Abelino Derrick, P.A.   ______________________________ Nanetta Batty, M.D.    Lenard Lance  D:  03/24/2011  T:  03/24/2011  Job:  [id number]  cc:   Nanetta Batty, M.D. Fax:  [phone]  Electronically Signed by Corine Shelter P.A. on 04/05/2011 04:36:51 PM Electronically Signed by Nanetta Batty M.D. on 04/11/2011 07:38:54 AM

## 2011-04-21 NOTE — Op Note (Signed)
[facility]  Patient:    [name], [name]                MRN: [id number] Proc. Date: 06/12/01 Attending:  Almedia Balls. Randell Patient, M.D. CC:         Harl Bowie, M.D.   Operative Report  PREOPERATIVE DIAGNOSES: 1. History of endometriosis. 2. Uterine fibroids. 3. Abnormal uterine bleeding. 4. Catamenial pneumothorax. 5. Pelvic pain.  POSTOPERATIVE DIAGNOSES: 1. History of endometriosis. 2. Uterine fibroids. 3. Abnormal uterine bleeding. 4. Catamenial pneumothorax. 5. Pelvic pain. 6. Pending pathology.  OPERATION: 1. Abdominal supracervical hysterectomy. 2. Bilateral salpingo-oophorectomy. 3. Extensive lysis of adhesions.  SURGEON:  Almedia Balls. Randell Patient, M.D.  ASSISTANT:  Harl Bowie, M.D.  ANESTHESIA:  General endotracheal anesthesia.  INDICATIONS:  The patient is a 50 year old with the above-noted problems who was counselled as to the need for surgery to treat these problems.  She was fully counselled as to the nature of the procedure and the risks involved including the risks of anesthesia, injury to bowel, bladder, blood vessels, ureters, postoperative hemorrhage, infection, recuperation, and use of hormone replacement/suppression for endometriosis following surgery.  OPERATIVE FINDINGS:  On entry into the peritoneal cavity, there were adhesions involving the uterus to the anterior peritoneal surface.  Loops of bowel were quite adherent to the left adnexal structures.  The uterus itself was adherent with a very dense adhesion to the bladder reflection.  Palpation of the upper viscera was limited by some adhesions, but the liver area of the gallbladder, spleen, kidneys, periaortic areas were normal to palpation.  DESCRIPTION OF PROCEDURE:     With the patient under general anesthesia, prepared and draped in the usual sterile fashion, with Foley catheter in the bladder, a lower abdominal transverse incision was made and carried into  the peritoneal cavity without difficulty.  The adhesions involving the uterus and the anterior peritoneal surface were lysed using Bovie electrocoagulation. Other adhesions involving loops of bowel on the posterior surface of the uterus and left adnexal area were lysed using Bovie electrocoagulation and sharp dissection.  It was possible to place a Kelly clamp on the uterine ovarian anastomosis on the right; however on the left, because of the adhesion, it was not possible to do this.  The adhesion anteriorly was lysed with very careful dissection sharply.  Small bleeders were rendered hemostatic with Bovie electrocoagulation.  A suture ligature of 3-0 chromic catgut was necessary in the midline because of the large vessel in this area.  Suture ligatures were then placed on the round ligaments bilaterally with transection of these structures using Bovie electrocoagulation.  Entry into the retroperitoneal space was carried out.  The left tube and ovary were quite adherent to the lateral peritoneal surface, making dissection at this time impossible because of the size of the uterus.  The right infundibulopelvic ligament was then isolated, clamped, cut, and doubly ligated with #1 chromic catgut.  The uterine vessels were then skeletonized on the right, clamped, cut, and suture ligated with #1 chronic catgut.  Cardinal ligaments were likewise clamped, cut, and suture ligated.  A Heaney clamp was placed across the uterine ovarian anastomosis on the left with the idea of removing the uterus and then returning for the adnexal structures.  This area was then cut free and suture ligated with #1 chromic catgut.  Uterine vessels were then skeletonized, clamped using Heaney clamp, cut, and suture ligated with #1 chromic catgut.  Cardinal ligament was likewise clamped, cut, and suture ligated with #  1 chromic catgut.  It was then possible to excise the uterine fundus with reverse conization of the cervix  to core out the endocervix using Bovie electrocoagulation.  Interrupted figure-of-eight sutures were used to reapproximate the remaining portion of the cervix and to render it hemostatic. Several small vessels were rendered hemostatic with Bovie electrocoagulation.  Attention was then directed to the left adnexal structures.  Further lysis of adhesions was necessary to open up the retroperitoneal space and to ensure that the ureter was well below the plane of dissection which was noted to be the case.  The infundibulopelvic ligament on the left was then clamped, cut, and doubly ligated with #1 chromic catgut.  Sharp and blunt dissection with Bovie electrocoagulation for bleeders was used to remove the left adnexal structures.  This are was then inspected for hemostasis, and small bleeders were rendered hemostatic with Bovie electrocoagulation.  The area was then lavaged with copious amounts of lactated Ringers solution.  After noting that hemostasis was maintained and that sponge and instrument counts were correct, the peritoneum was closed with a continuous suture of #0 Vicryl.  Fascia was closed with two sutures of #0 Vicryl which were brought from the lateral aspects of the incision and tied separately in the midline.  Each layer was lavaged with copious amounts of lactated Ringers solution prior to full closure.  Subcutaneous fat was then reapproximated with interrupted sutures of 0 Vicryl.  Subcuticular suture of 3-0 plain catgut was used for reapproximation of the skin.  At this point, with good hemostasis and correct sponge and instrument count throughout, the procedure was terminated. Estimated blood loss 900 ml with no replacement.  The patient was taken to the recovery room in good condition with clear urine in the Foley catheter tubing. She will be placed on 23-hour observation following surgery. DD:  06/12/01 TD:  06/12/01 Job: [id number]

## 2011-04-21 NOTE — Op Note (Signed)
[place]  Patient:    [name], [name]                 MRN: [id number] Proc. Date: 03/22/00 Adm. Date:  [id number] Disc. 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  PROCEDURE:  Right VATS for apical blood resection, apical pleurectomy, mechanical pleurodesis.  SURGEON:  Salvatore Decent. Cornelius Moras, M.D.  ASSISTANT:  Loura Pardon, P.A.  ANESTHESIA:  General.  BRIEF CLINICAL NOTE:  The patient is a 50 year old African-American female who presents with recurrent right spontaneous pneumothorax.  Full History and Physical exam has been dictated and has been transcribed.  She presented to the emergency room at [facility] on March 21, 2000.  Chest x-ray demonstrates a large right pneumothorax.  The patient remains in stable condition.  The patient and her husband are counseled at length regarding the indications and potential benefits of surgical intervention for recurrent right spontaneous pneumothorax.  They accept the associated risks of surgery including, but not limited to risks of death, myocardial infarction, arrhythmia, recurrent pneumothorax, prolonged chest wall discomfort, and prolonged air leak requiring prolonged chest tube drainage.  They accept these risks as well as any unforeseen complications, and agreed to proceed with surgery as described.  DESCRIPTION OF PROCEDURE:  The patient was brought to the operating room on the above mentioned date, and placed in the supine position on the operating table.  Following induction with general endotracheal anesthesia using a dual lumen endotracheal tube, the patient was turned to the left lateral decubitus position.  The patient  was positioned using a bean bag device and an axillary roll.  Pneumatic sequential compression boots are placed on both lower extremities to prevent deep venous thrombosis.  The patients right chest was prepared and draped in a sterile manner.  Single lung anesthesia was begun to facilitate complete collapse of the right lung.  The patients right chest is prepared and draped in a sterile manner.  A small incision was made through approximately the 7th intercostal space in the anterior axillary line.  The incision was completed through the subcutaneous tissues and intercostal muscles with electrocautery.  The right chest is entered bluntly and carefully.  Digital exploration is performed. There appears to be complete collapse of the lung and the lung tissue cannot be palpated.  Thoracoscopic port is passed through the incision and thoracoscopic video camera is passed through the port.  The right chest was explored visually.  The lung surface appears essentially normal.  There are a few ______ adhesions near the apex posteriorly which I have taken down with a combination of blunt dissection and electrocautery.  Otherwise, the lung is completely mobilized.  On careful inspection, one cannot appreciate any obvious blood on the surface of the lung itself.  There is no obvious site of leak which can be appreciated.  There are no other abnormalities seen on either the visceral or parietal surface of the pleura.  Two additional port incisions are placed including a second incision more posteriorly through the 5th intercostal space and a third incision more anteriorly to the 4th intercostal space.  Apical bleb resection is performed, resecting a triangular portion of the apex of the  right upper lobe, using several fires of the endoscopic GIA stapling device with medium lobe staples. The specimen was removed uneventfully and inspected off the table.  Small areas of blebs can be appreciated near  the apex.  This was sent to pathology for routine evaluation.  A similar, although smaller apical resection is made from the superior segment of the right lower lobe.  After completion of this, apical pleurectomy was performed.  The parietal pleura is removed using a combination of sharp dissection and mechanical traction to essentially pull pleura up the chest wall circumferentially around the apex of the right chest.  This is performed uneventfully.  Hemostasis is ascertained.  Subsequently, mechanical pleurodesis is performed.  A cautery scratch pad is fixed to a Kelly clamp and utilized to abrade the surface of the chest wall circumferentially.  Again, hemostasis is ascertained.  At this point, the continuation of the operative note continued as previously described.  I should reiterate, the chest is irrigated with saline solution containing kanamycin antibiotic solution.  A single 36-French right angle chest tube was placed through the inferior port incision and positioned posteriorly.  The chest tube was secured in place with 0 silk suture.  The two remaining port incisions were closed in multiple layers.  The skin incisions were closed with subcuticular skin closures.  The chest tube was affixed to a close suction drainage device.  The patient tolerated the procedure well, was extubated in the operating room, and transported to the recovery room in stable condition.  There were no intraoperative complications.  All sponge, instrument, and needle counts were verified correct at completion of the operation. DD:  04/22/00 TD:  04/26/00 Job: [id number]

## 2011-04-21 NOTE — Discharge Summary (Signed)
[facility]  Patient:    [name], [name]               MRN: [id number] Adm. Date:  [id number] Disc. Date: [id number] Attending:  Collene Schlichter                           Discharge Summary  HISTORY OF PRESENT ILLNESS:  The patient is a 50 year old with known uterine fibroids, endometriosis, catamenial pneumo and hemothorax for hysterectomy, bilateral salpingo-oophorectomy on 06/12/01.  The remainder of her history and physical are as previously dictated.  LABORATORY DATA:  Serum pregnancy test negative.  Chest x-ray with scarring in the right upper lobe secondary to previous pneumo and hemothorax.  Hemoglobin 12.5.  Electrocardiogram showed sinus bradycardia with left atrial enlargement, incomplete right bundle branch block, but with ST wave segment changes decreased from previous tracings.  HOSPITAL COURSE:  The patient was taken to the operating room on 7/10, at which time abdominal supracervical hysterectomy, bilateral salpingo-oophorectomy, extensive lysis of adhesions were performed.  The patient did well postoperatively.  Diet and ambulation were progressed over the evening of 7/10, and early morning of 7/11.  On the morning of 7/11, she was afebrile and experiencing no problems.  It was felt that she could be discharged at this time.  FINAL DIAGNOSES: 1. Uterine fibroids. 2. Endometriosis. 3. Extensive pelvic adhesions. 4. Pelvic pain. 5. Abnormal uterine bleeding. 6. History of catamenial pnuemo and hemothorax.  OPERATION:  Abdominal supracervical hysterectomy, bilateral salpingo-oophorectomy, extensive lysis of adhesions.  Pathology report is unavailable at the time of dictation.  DISPOSITION:  Discharged home to return to the office in two weeks for followup.  She was instructed to gradually progress her activities over several weeks at home, and to limit lifting and driving for two weeks.  She was fully ambulatory, on a  regular diet, and in good condition at the time of discharge.  DISCHARGE MEDICATIONS: 1. Mepergan Fortis #30 one or two q.4h. p.r.n. pain. 2. Doxycycline 100 mg #12 one b.i.d. 3. Trinsicon #60 one b.i.d. 4. Depo-Provera 400 mg IM while in the hospital.  She will call for any problems. DD:  06/13/01 TD:  06/13/01 Job: [id number]

## 2011-04-21 NOTE — H&P (Signed)
[facility]  Patient:    [name], [name]                MRN: [id number] Adm. Date:  06/12/01 Attending:  Almedia Balls. Randell Patient, M.D.                         History and Physical  CHIEF COMPLAINT:  Abnormal bleeding, fibroids, spontaneous hemothorax with menses.  HISTORY:  Patient is a 50 year old gravida 2, para 2, who has been followed in our office since October 2001 for her recent problem; she had been seen prior to that and had a several-year absence from the practice.  She was seen in October 2001 complaining of the catamenial hemothorax which had occurred in April of 2001.  It was felt that this was probably related to endometriosis which had been diagnosed prior with exploratory laparotomy in the past.  She has continued to have severe pain and lung problems with menses and at the time of menses, even though she has been placed on Depo-Provera in an attempt to improve this situation.  She had significant hot flashes and abnormal bleeding.  She underwent hysteroscopy/D&C in February 2002 with benign pathology report.  Pap smear was normal in October 2001.  She is admitted at this time for abdominal hysterectomy and bilateral salpingo-oophorectomy.  She has been fully counseled as to the nature of the procedure and the risks involved to include risks of anesthesia; injury to bowel, bladder, blood vessels, ureters; postoperative hemorrhage infection and recuperation; and use of hormone replacement following surgery.  She fully understands all these considerations and wishes to proceed on June 12, 2001.  PAST MEDICAL HISTORY:  C-section in 1995, laparoscopy which proceeded to exploratory laparotomy several times for findings of endometriosis and fibroids, had an excision of her umbilicus because of endometriosis and had breast lump removed from her left breast which was benign.  FAMILY HISTORY:  Uncle with lung cancer; father with cardiovascular  disease.  REVIEW OF SYSTEMS:  HEENT:  Negative.  CARDIORESPIRATORY:  History of a heart murmur as a child but none recently.  Some palpitation in the past, again none recently.  Lung problems as noted above.  GASTROINTESTINAL:  Chronic constipation and hemorrhoids.  GENITOURINARY:  As in present illness. NEUROMUSCULAR:  Negative.   PHYSICAL EXAMINATION:  VITAL SIGNS:  Height 5 feet 10-1/4 inches.  Weight 160 pounds.  Blood pressure 82/50.  GENERAL:  Well-developed black female in no acute distress.  HEENT:  Within normal limits.  NECK:  Supple without masses, adenopathy or bruits.  HEART:  Regular rate and rhythm without murmurs.  LUNGS:  Clear to P&A.  BREASTS:  Examined sitting and lying without mass.  Axillae negative.  ABDOMEN:  Soft and flat without palpable mass, nontender.  EXTREMITIES:  Within normal limits.  CENTRAL NERVOUS SYSTEM:  Grossly intact.  SKIN:  Without suspicious lesions.  PELVIC:  External genitalia, Bartholins, urethra and Skenes glands within normal limits.  Vaginal exam is normal.  Cervix is slightly inflamed.  Uterus is midposition, approximately 12 to [redacted] weeks gestational size.  There are no palpable masses in the adnexa.  Anterior and posterior cul-de-sac exam is confirmatory.  IMPRESSION:  Abnormal uterine bleeding with catamenial hemothorax.  DISPOSITION:  As noted above. DD:  06/10/01 TD:  06/10/01 Job: [id number]

## 2011-06-12 ENCOUNTER — Telehealth: Payer: Self-pay | Admitting: *Deleted

## 2011-06-12 NOTE — Telephone Encounter (Signed)
Pt called requesting samples of Bystolic. Pt states she has spoken to her cardiologist and has had to go back on Bystolic 20mg  daily due to return of symptoms. Restarted medication about 1 month ago. Please advise if ok to give samples.

## 2011-06-12 NOTE — Telephone Encounter (Signed)
OK to give 30 days of samples only, she is due for follow up.

## 2011-06-14 NOTE — Telephone Encounter (Signed)
4 boxes Bystolic 20mg  was left at front desk for pick up yesterday and pt was notified.

## 2011-06-28 ENCOUNTER — Encounter: Payer: Self-pay | Admitting: Family

## 2011-09-25 ENCOUNTER — Emergency Department (HOSPITAL_BASED_OUTPATIENT_CLINIC_OR_DEPARTMENT_OTHER)
Admission: EM | Admit: 2011-09-25 | Discharge: 2011-09-25 | Disposition: A | Discharge status: Home / Self Care | Payer: 59 | Attending: Emergency Medicine | Admitting: Emergency Medicine

## 2011-09-25 ENCOUNTER — Encounter (HOSPITAL_BASED_OUTPATIENT_CLINIC_OR_DEPARTMENT_OTHER): Payer: Self-pay | Admitting: *Deleted

## 2011-09-25 DIAGNOSIS — R42 Dizziness and giddiness: Secondary | ICD-10-CM | POA: Insufficient documentation

## 2011-09-25 LAB — URINALYSIS, ROUTINE W REFLEX MICROSCOPIC
Glucose, UA: NEGATIVE mg/dL
Ketones, ur: NEGATIVE mg/dL
Protein, ur: NEGATIVE mg/dL
pH: 7.5 (ref 5.0–8.0)

## 2011-09-25 LAB — COMPREHENSIVE METABOLIC PANEL
ALT: 13 U/L (ref 0–35)
AST: 15 U/L (ref 0–37)
Albumin: 4.2 g/dL (ref 3.5–5.2)
Alkaline Phosphatase: 64 U/L (ref 39–117)
CO2: 26 mEq/L (ref 19–32)
Chloride: 106 mEq/L (ref 96–112)
GFR calc non Af Amer: >90 mL/min (ref >90)
Potassium: 3.6 mEq/L (ref 3.5–5.1)
Sodium: 142 mEq/L (ref 135–145)
Total Bilirubin: 0.3 mg/dL (ref 0.3–1.2)

## 2011-09-25 LAB — CBC
HCT: 34.6 % — ABNORMAL LOW (ref 36.0–46.0)
Platelets: 225 10*3/uL (ref 150–400)
RDW: 14.9 % (ref 11.5–15.5)
WBC: 6.3 10*3/uL (ref 4.0–10.5)

## 2011-09-25 LAB — DIFFERENTIAL
Basophils Absolute: 0 10*3/uL (ref 0.0–0.1)
Lymphocytes Relative: 47 % — ABNORMAL HIGH (ref 12–46)
Monocytes Relative: 8 % (ref 3–12)
Neutro Abs: 2.7 10*3/uL (ref 1.7–7.7)
Neutrophils Relative %: 43 % (ref 43–77)

## 2011-09-25 LAB — URINE MICROSCOPIC-ADD ON

## 2011-09-25 MED ORDER — SERTRALINE HCL 50 MG PO TABS: 50.0000 mg | ORAL_TABLET | Freq: Every day | ORAL | Status: DC

## 2011-09-25 NOTE — ED Notes (Signed)
Pt sts she has been intermittently dizzy and it is getting worse. Pt sts she thought initially that it was anxiety but now she is not sure.

## 2011-09-25 NOTE — ED Provider Notes (Addendum)
Scribed for Hanley Seamen, MD, the patient was seen in room MH03/MH03 . This chart was scribed by Ellie Lunch.   CSN: [id number] Arrival date & time: 09/25/2011  6:16 PM   First MD Initiated Contact with Patient 09/25/11 1857      Chief Complaint  Patient presents with  . Dizziness    (Consider location/radiation/quality/duration/timing/severity/associated sxs/prior treatment) HPI [name] is a 50 y.o. female who presents to the Emergency Department complaining of dizziness. Pt reports periodic episodes of dizziness with associated SOB, difficulty swallowing, and at times difficulty speaking. Sx started last week. Dizziness described like she is going to pass out and off balanced. Pt denies a spinning sensation. Pt reports she came in today because of an episode that began as she was walking in [employer] she began to fell like she was going to pass out and SOB. Pt also reports palpitations but says this is normal. Denies any CP, N, abd pain. Reports some diarrhea for the past 3 days. Reports nothing seems to initiate dizziness episodes and nothing makes them better or worse. Pt denies sense of impending doom. Pt reports a history of depression. After some reflection Pt admits while she is usually able to fight off depression, she may have overlooked it as a cause for her symptoms.  Per husband Pt has been emotionally labile.  Past Medical History  Diagnosis Date  . Hypertension   . Depression   . Migraine   . Irregular heart rhythm     unclear what arrhythmia  . Pneumothorax   . Torus palatinus   . UTI (urinary tract infection)     Past Surgical History  Procedure Date  . Cesarean section     x 2  . Abdominal hysterectomy 2005  . Pleural scarification     Family History  Problem Relation Age of Onset  . Depression Mother   . Hypertension Father   . Arthritis Other   . Coronary artery disease Other   . Stroke Other   . Mental illness Other     History    Substance Use Topics  . Smoking status: Never Smoker   . Smokeless tobacco: Never Used  . Alcohol Use: No    Review of Systems  Constitutional: Negative for fever.  Respiratory: Positive for shortness of breath. Negative for wheezing.   Cardiovascular: Negative for chest pain.  Gastrointestinal: Positive for diarrhea. Negative for nausea, vomiting and abdominal pain.  Genitourinary: Negative for vaginal bleeding.  Neurological: Positive for dizziness and speech difficulty. Negative for syncope, weakness and headaches.  Psychiatric/Behavioral: Positive for agitation.  All other systems reviewed and are negative.   Allergies  Review of patient's allergies indicates no known allergies.  Home Medications   Current Outpatient Rx  Name Route Sig Dispense Refill  . VITAMIN D 1000 UNITS PO TABS Oral Take 1,000 Units by mouth daily.      . NEBIVOLOL HCL 20 MG PO TABS Oral Take 1 tablet by mouth daily.        BP 127/93  Pulse 72  Temp(Src) 99.1 F (37.3 C) (Oral)  Resp 16  Ht 5\' 10"  (1.778 m)  Wt 165 lb (74.844 kg)  BMI 23.68 kg/m2  Physical Exam  Nursing note and vitals reviewed. Constitutional: She is oriented to person, place, and time. She appears well-developed and well-nourished. No distress.       Appears teary eyed. Emotional lability on examination.   HENT:  Head: Normocephalic and atraumatic.  Eyes:  Conjunctivae and EOM are normal.  Neck: Normal range of motion. No thyromegaly present.  Cardiovascular: Normal rate, regular rhythm and normal heart sounds.   Pulmonary/Chest: Effort normal and breath sounds normal.  Abdominal: Soft. There is no tenderness. There is no rebound and no guarding.  Musculoskeletal: Normal range of motion.  Neurological: She is alert and oriented to person, place, and time.  Skin: Skin is warm and dry.   ED Course  Procedures (including critical care time)  OTHER DATA REVIEWED: Nursing notes, vital signs, and past medical records  reviewed.  DIAGNOSTIC STUDIES: Oxygen Saturation is 98% on room air, normal by my interpretation.     7:06 PM Discussed with PT that pulse rate is normal during episode. Heart is not likely involved. Plan to check basic blood work. Possibly panic attack. Plan to rule out anemia or electrolyte imbalance. Plan to check thyroid level.   MDM   Nursing notes and vitals signs, including pulse oximetry, reviewed.  Summary of this visit's results, reviewed by myself:  Labs:  Results for orders placed during the hospital encounter of 09/25/11  CBC      Component Value Range   WBC 6.3  4.0 - 10.5 (K/uL)   RBC 4.80  3.87 - 5.11 (MIL/uL)   Hemoglobin 12.1  12.0 - 15.0 (g/dL)   HCT 91.4 (*) 78.2 - 46.0 (%)   MCV 72.1 (*) 78.0 - 100.0 (fL)   MCH 25.2 (*) 26.0 - 34.0 (pg)   MCHC 35.0  30.0 - 36.0 (g/dL)   RDW 95.6  21.3 - 08.6 (%)   Platelets 225  150 - 400 (K/uL)  DIFFERENTIAL      Component Value Range   Neutrophils Relative 43  43 - 77 (%)   Lymphocytes Relative 47 (*) 12 - 46 (%)   Monocytes Relative 8  3 - 12 (%)   Eosinophils Relative 2  0 - 5 (%)   Basophils Relative 0  0 - 1 (%)   Neutro Abs 2.7  1.7 - 7.7 (K/uL)   Lymphs Abs 3.0  0.7 - 4.0 (K/uL)   Monocytes Absolute 0.5  0.1 - 1.0 (K/uL)   Eosinophils Absolute 0.1  0.0 - 0.7 (K/uL)   Basophils Absolute 0.0  0.0 - 0.1 (K/uL)  COMPREHENSIVE METABOLIC PANEL      Component Value Range   Sodium 142  135 - 145 (mEq/L)   Potassium 3.6  3.5 - 5.1 (mEq/L)   Chloride 106  96 - 112 (mEq/L)   CO2 26  19 - 32 (mEq/L)   Glucose, Bld 103 (*) 70 - 99 (mg/dL)   BUN 17  6 - 23 (mg/dL)   Creatinine, Ser 5.78  0.50 - 1.10 (mg/dL)   Calcium 9.4  8.4 - 46.9 (mg/dL)   Total Protein 7.4  6.0 - 8.3 (g/dL)   Albumin 4.2  3.5 - 5.2 (g/dL)   AST 15  0 - 37 (U/L)   ALT 13  0 - 35 (U/L)   Alkaline Phosphatase 64  39 - 117 (U/L)   Total Bilirubin 0.3  0.3 - 1.2 (mg/dL)   GFR calc non Af Amer >90  >90 (mL/min)   GFR calc Af Amer >90  >90 (mL/min)    URINALYSIS, ROUTINE W REFLEX MICROSCOPIC      Component Value Range   Color, Urine YELLOW  YELLOW    Appearance CLEAR  CLEAR    Specific Gravity, Urine 1.010  1.005 - 1.030    pH 7.5  5.0 - 8.0    Glucose, UA NEGATIVE  NEGATIVE (mg/dL)   Hgb urine dipstick TRACE (*) NEGATIVE    Bilirubin Urine NEGATIVE  NEGATIVE    Ketones, ur NEGATIVE  NEGATIVE (mg/dL)   Protein, ur NEGATIVE  NEGATIVE (mg/dL)   Urobilinogen, UA 0.2  0.0 - 1.0 (mg/dL)   Nitrite NEGATIVE  NEGATIVE    Leukocytes, UA SMALL (*) NEGATIVE   URINE MICROSCOPIC-ADD ON      Component Value Range   Squamous Epithelial / LPF RARE  RARE    WBC, UA 3-6  <3 (WBC/hpf)   Bacteria, UA RARE  RARE     Imaging Studies: No results found.  8:16 PM Suspect symptoms are related to depression. She is been treated for depression several times in the past. Patient was advised of the diagnosis at this point is not specific as additional studies such as evaluation for thyroid or best performed by her primary care physician. A TSH is pending and she was advised to have this followed up by her primary care provider.  I personally performed the services described in this documentation, which was scribed in my presence.  The recorded information has been reviewed and considered.       Hanley Seamen, MD 09/25/11 2029  Hanley Seamen, MD 09/25/11 2036

## 2011-09-25 NOTE — ED Notes (Signed)
MD at bedside. 

## 2011-10-04 ENCOUNTER — Telehealth: Payer: Self-pay | Admitting: Family

## 2011-10-04 NOTE — Telephone Encounter (Signed)
Please call patient to arrange an ED follow up visit.

## 2011-10-04 NOTE — Telephone Encounter (Signed)
Call placed to patient at [phone], no answer. A detailed voice message was left advising patient per Sandford Craze instructions.

## 2011-11-16 ENCOUNTER — Telehealth: Payer: Self-pay | Admitting: *Deleted

## 2011-11-16 NOTE — Telephone Encounter (Signed)
agree

## 2011-11-16 NOTE — Telephone Encounter (Signed)
Pt reports pain in her chest when she breathes and has intermittent shortness of breath x 1 week after beginning exercise. Pt reports chest is tender/sore to touch. Feels fatigued. Reports history of pneumothorax. Pt has scheduled appt for Friday @ 10:45 and was advised to be seen in ER if symptoms worsen. Please advise if there are any further instructions. Pt requests we leave detailed message on cell [phone].

## 2011-11-16 NOTE — Telephone Encounter (Signed)
Called pt and left detailed message to proceed as planned and go to ER for worsening symptoms.

## 2011-11-17 ENCOUNTER — Encounter: Payer: Self-pay | Admitting: Family

## 2011-11-17 ENCOUNTER — Ambulatory Visit (HOSPITAL_BASED_OUTPATIENT_CLINIC_OR_DEPARTMENT_OTHER)
Admission: RE | Admit: 2011-11-17 | Discharge: 2011-11-17 | Disposition: A | Discharge status: Home / Self Care | Payer: BC Managed Care – PPO | Source: Ambulatory Visit | Attending: Family | Admitting: Family

## 2011-11-17 ENCOUNTER — Ambulatory Visit (INDEPENDENT_AMBULATORY_CARE_PROVIDER_SITE_OTHER): Payer: BC Managed Care – PPO | Admitting: Family

## 2011-11-17 ENCOUNTER — Telehealth: Payer: Self-pay | Admitting: Family

## 2011-11-17 VITALS — BP 126/84 | HR 65 | Temp 98.2°F | Resp 16 | Ht 70.0 in | Wt 168.1 lb

## 2011-11-17 DIAGNOSIS — R0781 Pleurodynia: Secondary | ICD-10-CM | POA: Insufficient documentation

## 2011-11-17 DIAGNOSIS — R079 Chest pain, unspecified: Secondary | ICD-10-CM | POA: Insufficient documentation

## 2011-11-17 DIAGNOSIS — R0602 Shortness of breath: Secondary | ICD-10-CM | POA: Insufficient documentation

## 2011-11-17 DIAGNOSIS — Z23 Encounter for immunization: Secondary | ICD-10-CM

## 2011-11-17 DIAGNOSIS — R071 Chest pain on breathing: Secondary | ICD-10-CM

## 2011-11-17 NOTE — Telephone Encounter (Signed)
Pt.notified

## 2011-11-17 NOTE — Assessment & Plan Note (Signed)
New.  Likely viral in nature.  CXR performed today is negative- specifically it does not show any sign of pneumothorax.  I have advised her to use aleve 220mg  po bid for the next several days for pain and to call us if symptoms worsen or if they do not improve.

## 2011-11-17 NOTE — Patient Instructions (Signed)
You may use aleve 220mg  twice daily as needed for chest pain for the next few days. Complete chest x-ray on the first floor.  Call if symptoms worsen, or if not resolved in 1 week.

## 2011-11-17 NOTE — Progress Notes (Signed)
Subjective:    Patient ID: [name], female    DOB: February 09, 1961, 50 y.o.   MRN: [id number]  HPI  [name] is a 50 yr old female who presents today with chief complaint of right sided chest discomfort.  She reports that the discomfort is worst with deep breath.  Symptoms started several days ago. She notes some intermittent shortness of breath.  She reports that she had a brief episode of shortness of breath this morning. Denies associated fevers, calf pain/swelling, or recent long travel.   She reports that she has had some mild viral symptoms for the last few days.  She is tender beneath the right breast.  Has hx of spontaneous pneumothorax in the past and she wants to make sure that she is not developing another pneumothorax.  Review of Systems See HPI  Past Medical History  Diagnosis Date  . Hypertension   . Depression   . Migraine   . Irregular heart rhythm     unclear what arrhythmia  . Pneumothorax   . Torus palatinus   . UTI (urinary tract infection)     History   Social History  . Marital Status: Married    Spouse Name: N/A    Number of Children: 2  . Years of Education: N/A   Occupational History  . SALES    Social History Main Topics  . Smoking status: Never Smoker   . Smokeless tobacco: Never Used  . Alcohol Use: No  . Drug Use: No  . Sexually Active: Not on file   Other Topics Concern  . Not on file   Social History Narrative  . No narrative on file    Past Surgical History  Procedure Date  . Cesarean section     x 2  . Abdominal hysterectomy 2005  . Pleural scarification     Family History  Problem Relation Age of Onset  . Depression Mother   . Hypertension Father   . Arthritis Other   . Coronary artery disease Other   . Stroke Other   . Mental illness Other     No Known Allergies  Current Outpatient Prescriptions on File Prior to Visit  Medication Sig Dispense Refill  . cholecalciferol (VITAMIN D) 1000 UNITS tablet Take  1,000 Units by mouth daily.        . Nebivolol HCl (BYSTOLIC) 20 MG TABS Take 1 tablet by mouth daily.          BP 126/84  Pulse 65  Temp(Src) 98.2 F (36.8 C) (Oral)  Resp 16  Ht 5\' 10"  (1.778 m)  Wt 168 lb 1.3 oz (76.241 kg)  BMI 24.12 kg/m2  SpO2 100%       Objective:   Physical Exam  Constitutional: She appears well-developed and well-nourished. No distress.  HENT:  Head: Normocephalic and atraumatic.  Mouth/Throat: No oropharyngeal exudate.  Eyes: Conjunctivae are normal. Pupils are equal, round, and reactive to light. No scleral icterus.  Neck: Normal range of motion. Neck supple.  Cardiovascular: Normal rate and regular rhythm.   No murmur heard. Pulmonary/Chest: Effort normal and breath sounds normal. No respiratory distress. She has no wheezes. She has no rales. She exhibits tenderness.       She is tender to palpation beneath the right breast.   Musculoskeletal: She exhibits no edema.  Lymphadenopathy:    She has no cervical adenopathy.  Skin: Skin is warm and dry.  Psychiatric: She has a normal mood and affect. Her  behavior is normal. Judgment and thought content normal.          Assessment & Plan:

## 2011-11-17 NOTE — Telephone Encounter (Signed)
Pls call pt and let her know that her chest x-ray is negative. She request that we call her at [phone].

## 2011-12-05 ENCOUNTER — Emergency Department (HOSPITAL_COMMUNITY)
Admission: EM | Admit: 2011-12-05 | Discharge: 2011-12-05 | Disposition: A | Discharge status: Home / Self Care | Payer: BC Managed Care – PPO | Source: Home / Self Care

## 2011-12-05 ENCOUNTER — Encounter (HOSPITAL_COMMUNITY): Payer: Self-pay | Admitting: *Deleted

## 2011-12-05 DIAGNOSIS — J069 Acute upper respiratory infection, unspecified: Secondary | ICD-10-CM

## 2011-12-05 MED ORDER — LORATADINE 10 MG PO TABS: 10.0000 mg | ORAL_TABLET | Freq: Every day | ORAL | Status: DC

## 2011-12-05 MED ORDER — IBUPROFEN 600 MG PO TABS: 600.0000 mg | ORAL_TABLET | Freq: Four times a day (QID) | ORAL | Status: AC | PRN

## 2011-12-05 NOTE — ED Notes (Signed)
Pt   Reports  Symptoms  Of  Cough  /  Congested   sorethroat  And  Runny  Nose    Symptoms  X  2-3  Days        Pt  In  No  Severe  Distress  Speaking in  Complete  sentances

## 2011-12-05 NOTE — ED Provider Notes (Signed)
Medical screening examination/treatment/procedure(s) were performed by non-physician practitioner and as supervising physician I was immediately available for consultation/collaboration.   Bess Saltzman DOUGLAS MD.    [name] Douglas Addilynn Mowrer, MD 12/05/11 1405 

## 2011-12-05 NOTE — ED Provider Notes (Signed)
History     CSN: [id number]  Arrival date & time 12/05/11  7829   None     Chief Complaint  Patient presents with  . Cough    (Consider location/radiation/quality/duration/timing/severity/associated sxs/prior treatment) HPI Comments: Pt presents with c/o clear rhinorrhea, nasal congestion, HA and cough x 2-3 days. She had sneezing day before symptoms began but none since. Cough is nonproductive. No fever or chills. She is taking Sudafed PE with mild improvement of congestion. Pt requests that anything she can take for symptoms be given as rx for her flex-spending acct.   The history is provided by the patient.    Past Medical History  Diagnosis Date  . Hypertension   . Depression   . Migraine   . Irregular heart rhythm     unclear what arrhythmia  . Pneumothorax   . Torus palatinus   . UTI (urinary tract infection)     Past Surgical History  Procedure Date  . Cesarean section     x 2  . Abdominal hysterectomy 2005  . Pleural scarification     Family History  Problem Relation Age of Onset  . Depression Mother   . Hypertension Father   . Arthritis Other   . Coronary artery disease Other   . Stroke Other   . Mental illness Other     History  Substance Use Topics  . Smoking status: Never Smoker   . Smokeless tobacco: Never Used  . Alcohol Use: No    OB History    Grav Para Term Preterm Abortions TAB SAB Ect Mult Living                  Review of Systems  Constitutional: Positive for fatigue. Negative for fever and chills.  HENT: Positive for congestion, sore throat and rhinorrhea. Negative for ear pain, sneezing, postnasal drip and sinus pressure.   Respiratory: Positive for cough. Negative for shortness of breath and wheezing.   Cardiovascular: Negative for chest pain and palpitations.  Gastrointestinal: Negative for nausea, vomiting and diarrhea.  Musculoskeletal: Negative for myalgias.  Neurological: Positive for headaches.    Allergies  Review  of patient's allergies indicates no known allergies.  Home Medications   Current Outpatient Rx  Name Route Sig Dispense Refill  . VITAMIN D 1000 UNITS PO TABS Oral Take 1,000 Units by mouth daily.      . IBUPROFEN 600 MG PO TABS Oral Take 1 tablet (600 mg total) by mouth every 6 (six) hours as needed for pain. 12 tablet 0  . LORATADINE 10 MG PO TABS Oral Take 1 tablet (10 mg total) by mouth daily. 7 tablet 0  . NEBIVOLOL HCL 20 MG PO TABS Oral Take 1 tablet by mouth daily.        BP 131/85  Pulse 63  Temp(Src) 98.8 F (37.1 C) (Oral)  Resp 18  SpO2 100%  Physical Exam  Nursing note and vitals reviewed. Constitutional: She appears well-developed and well-nourished. No distress.  HENT:  Head: Normocephalic and atraumatic.  Right Ear: Tympanic membrane, external ear and ear canal normal.  Left Ear: Tympanic membrane, external ear and ear canal normal.  Nose: Mucosal edema (moderate edema bilat nasal turbinates) and rhinorrhea (clear) present.  Mouth/Throat: Uvula is midline, oropharynx is clear and moist and mucous membranes are normal. No oropharyngeal exudate, posterior oropharyngeal edema or posterior oropharyngeal erythema.  Neck: Neck supple.  Cardiovascular: Normal rate, regular rhythm and normal heart sounds.   Pulmonary/Chest: Effort normal and  breath sounds normal. No respiratory distress.  Lymphadenopathy:    She has no cervical adenopathy.  Neurological: She is alert.  Skin: Skin is warm and dry.  Psychiatric: She has a normal mood and affect.    ED Course  Procedures (including critical care time)  Labs Reviewed - No data to display No results found.   1. Acute URI       MDM  URI symptoms, afebrile, exam neg except for clear rhinorrhea and swelling of nasal turbinates.        Melody Comas, [place] 12/05/11 1153

## 2011-12-07 ENCOUNTER — Encounter: Payer: Self-pay | Admitting: Family Medicine

## 2011-12-07 ENCOUNTER — Ambulatory Visit (INDEPENDENT_AMBULATORY_CARE_PROVIDER_SITE_OTHER): Payer: BC Managed Care – PPO | Admitting: Family Medicine

## 2011-12-07 VITALS — BP 130/84 | HR 64 | Temp 97.8°F | Wt 169.0 lb

## 2011-12-07 DIAGNOSIS — J019 Acute sinusitis, unspecified: Secondary | ICD-10-CM

## 2011-12-07 MED ORDER — HYDROCODONE-HOMATROPINE 5-1.5 MG/5ML PO SYRP: ORAL_SOLUTION | ORAL | Status: AC

## 2011-12-07 MED ORDER — CEPHALEXIN 500 MG PO CAPS: 500.0000 mg | ORAL_CAPSULE | Freq: Three times a day (TID) | ORAL | Status: AC

## 2011-12-07 NOTE — Patient Instructions (Signed)
Saline nasal spray 2-3 sprays each nostril 3 times per day.

## 2011-12-07 NOTE — Progress Notes (Signed)
OFFICE NOTE  12/07/2011  CC:  Chief Complaint  Patient presents with  . URI    nasal congestion, itchy throat, ears clogged, since Saturday.  Went to [facility] on Monday     HPI: Patient is a 51 y.o. African-American female who is here for URI sx's. Pt presents complaining of respiratory symptoms for 6  days.  Mostly nasal congestion/runny nose, sneezing, and PND cough, some ST, intermittent HA.  Worst symptoms seems to be the fatigue and head/nasal fullness.  Lately the symptoms seem to be worsening. No fevers, no wheezing, and no SOB.  No pain in face or teeth.  No significant HA.  ST mild at most.  Symptoms made worse by activity.  Symptoms improved by nothing. Smoker? no Recent sick contact? Yes-husband dx'd with flu last week. Muscle or joint aches? Neck and shoulders aching She got flu vaccine this season.  ROS: no n/v/d or abdominal pain.  No rash.  No neck stiffness.   +Mild fatigue.  +Mild appetite loss.   Pertinent PMH:  Anx/Dep HTN No hx of asthma or COPD  Pertinent Meds: claritin qd, bystolic,ibuprofen,   PE: Blood pressure 130/84, pulse 64, temperature 97.8 F (36.6 C), temperature source Temporal, weight 169 lb (76.658 kg), SpO2 99.00%. VS: noted--normal. Gen: alert, NAD, well appearing. HEENT: eyes without injection, drainage, or swelling.  Ears: EACs clear, TMs with normal light reflex and landmarks.  Nose: Clear rhinorrhea, with some dried, crusty exudate adherent to mildly injected mucosa.  No purulent d/c.  No paranasal sinus TTP.  No facial swelling.  Throat and mouth without focal lesion.  No pharyngial swelling, erythema, or exudate.   Neck: supple, no LAD.  Nontender.  ROM fully intact. LUNGS: CTA bilat, nonlabored resps.   CV: RRR, no m/r/g. EXT: no c/c/e SKIN: no rash    IMPRESSION AND PLAN: Acute sinusitis. Keflex 500mg  tid x 10d. Saline nasal spray. Hycodan cough syrup prn. Therapeutic expectations and side effect profile of medication  discussed today.  Patient's questions answered.   FOLLOW UP: prn

## 2012-01-02 ENCOUNTER — Other Ambulatory Visit: Payer: Self-pay | Admitting: *Deleted

## 2012-01-02 NOTE — Telephone Encounter (Signed)
Received fax from [facility] from 01/01/12 for Bystolic 20mg . Please advise re: number of refills.

## 2012-01-03 MED ORDER — NEBIVOLOL HCL 20 MG PO TABS: 1.0000 | ORAL_TABLET | Freq: Every day | ORAL | Status: DC

## 2012-01-03 NOTE — Telephone Encounter (Signed)
OK to give 30 days with 3 refills.

## 2012-01-03 NOTE — Telephone Encounter (Signed)
Refill sent via eRx. 

## 2012-01-25 ENCOUNTER — Encounter: Payer: BC Managed Care – PPO | Admitting: Women's Health

## 2012-02-01 ENCOUNTER — Ambulatory Visit (INDEPENDENT_AMBULATORY_CARE_PROVIDER_SITE_OTHER): Payer: BC Managed Care – PPO | Admitting: Women's Health

## 2012-02-01 ENCOUNTER — Encounter: Payer: Self-pay | Admitting: Women's Health

## 2012-02-01 VITALS — BP 130/90 | Ht 71.0 in | Wt 167.0 lb

## 2012-02-01 DIAGNOSIS — Z780 Asymptomatic menopausal state: Secondary | ICD-10-CM

## 2012-02-01 DIAGNOSIS — N898 Other specified noninflammatory disorders of vagina: Secondary | ICD-10-CM

## 2012-02-01 DIAGNOSIS — Z01419 Encounter for gynecological examination (general) (routine) without abnormal findings: Secondary | ICD-10-CM

## 2012-02-01 DIAGNOSIS — N9489 Other specified conditions associated with female genital organs and menstrual cycle: Secondary | ICD-10-CM

## 2012-02-01 MED ORDER — ESTRADIOL 10 MCG VA TABS: 10.0000 ug | ORAL_TABLET | Freq: Every morning | VAGINAL | Status: DC

## 2012-02-01 NOTE — Progress Notes (Signed)
[name] 1961-04-05 [id number]    History:    The patient presents for annual exam. History of  TAH/BSO for fibroids and endometriosis. History of normal mammograms per patient. Primary care manages hypertension and labs. Has used Vagifem in the past for vaginal dryness, stopped estrogen patch greater than 5 years ago.   Past medical history, past surgical history, family history and social history were all reviewed and documented in the EPIC chart. Normal colonoscopy in O9. Works for [employer] in [place]. .   ROS:  A  ROS was performed and pertinent positives and negatives are included in the history.  Exam:  Filed Vitals:   02/01/12 1447  BP: 130/90    General appearance:  Normal Head/Neck:  Normal, without cervical or supraclavicular adenopathy. Thyroid:  Symmetrical, normal in size, without palpable masses or nodularity. Respiratory  Effort:  Normal  Auscultation:  Clear without wheezing or rhonchi Cardiovascular  Auscultation:  Regular rate, without rubs, murmurs or gallops  Edema/varicosities:  Not grossly evident Abdominal  Soft,nontender, without masses, guarding or rebound.  Liver/spleen:  No organomegaly noted  Hernia:  None appreciated  Skin  Inspection:  Grossly normal  Palpation:  Grossly normal Neurologic/psychiatric  Orientation:  Normal with appropriate conversation.  Mood/affect:  Normal  Genitourinary    Breasts: Examined lying and sitting.     Right: Without masses, retractions, discharge or axillary adenopathy.     Left: Without masses, retractions, discharge or axillary adenopathy.   Inguinal/mons:  Normal without inguinal adenopathy  External genitalia:  Normal  BUS/Urethra/Skene's glands:  Normal  Bladder:  Normal  Vagina:  Atrophic  Cervix:  Absent  Uterus:    Adnexa/parametria:     Rt: Without masses or tenderness.   Lt: Without masses or tenderness.  Anus and perineum: Normal  Digital rectal exam: Normal sphincter tone  without palpated masses or tenderness  Assessment/Plan:  52 y.o. MBF  G2 P2 for annual exam complaint of vaginal dryness.     TAH/BSO-no ERT Vaginal atrophy Hypertension-primary care lab segments.  Plan: Options for vaginal atrophy reviewed. Will try Vagifem one applicator at bedtime for 2 weeks and then 2-3 times per week there after. Sample given. Encouraged vaginal lubricants also. SBE's, annual mammogram, instructed to have mammogram report faxed to our office, has had done in [place]. Encouraged increased exercise, calcium rich diet, vitamin D 1000 daily. DEXA- will schedule here.   Harrington Challenger [facility], 5:04 PM 02/01/2012

## 2012-02-17 ENCOUNTER — Emergency Department (INDEPENDENT_AMBULATORY_CARE_PROVIDER_SITE_OTHER): Payer: BC Managed Care – PPO

## 2012-02-17 ENCOUNTER — Emergency Department (HOSPITAL_BASED_OUTPATIENT_CLINIC_OR_DEPARTMENT_OTHER)
Admission: EM | Admit: 2012-02-17 | Discharge: 2012-02-17 | Disposition: A | Discharge status: Home / Self Care | Payer: BC Managed Care – PPO | Attending: Emergency Medicine | Admitting: Emergency Medicine

## 2012-02-17 ENCOUNTER — Encounter (HOSPITAL_BASED_OUTPATIENT_CLINIC_OR_DEPARTMENT_OTHER): Payer: Self-pay | Admitting: *Deleted

## 2012-02-17 ENCOUNTER — Other Ambulatory Visit: Payer: Self-pay

## 2012-02-17 DIAGNOSIS — I10 Essential (primary) hypertension: Secondary | ICD-10-CM | POA: Insufficient documentation

## 2012-02-17 DIAGNOSIS — M79609 Pain in unspecified limb: Secondary | ICD-10-CM

## 2012-02-17 DIAGNOSIS — F329 Major depressive disorder, single episode, unspecified: Secondary | ICD-10-CM | POA: Insufficient documentation

## 2012-02-17 DIAGNOSIS — R079 Chest pain, unspecified: Secondary | ICD-10-CM

## 2012-02-17 DIAGNOSIS — F3289 Other specified depressive episodes: Secondary | ICD-10-CM | POA: Insufficient documentation

## 2012-02-17 DIAGNOSIS — I517 Cardiomegaly: Secondary | ICD-10-CM | POA: Insufficient documentation

## 2012-02-17 DIAGNOSIS — R0602 Shortness of breath: Secondary | ICD-10-CM | POA: Insufficient documentation

## 2012-02-17 DIAGNOSIS — R0789 Other chest pain: Secondary | ICD-10-CM

## 2012-02-17 HISTORY — DX: Supraventricular tachycardia, unspecified: I47.10

## 2012-02-17 HISTORY — DX: Supraventricular tachycardia: I47.1

## 2012-02-17 LAB — COMPREHENSIVE METABOLIC PANEL
ALT: 20 U/L (ref 0–35)
AST: 20 U/L (ref 0–37)
Albumin: 4.1 g/dL (ref 3.5–5.2)
Alkaline Phosphatase: 74 U/L (ref 39–117)
BUN: 12 mg/dL (ref 6–23)
CO2: 29 mEq/L (ref 19–32)
Calcium: 9.7 mg/dL (ref 8.4–10.5)
Chloride: 105 mEq/L (ref 96–112)
Creatinine, Ser: 0.7 mg/dL (ref 0.50–1.10)
GFR calc Af Amer: >90 mL/min (ref >90)
GFR calc non Af Amer: >90 mL/min (ref >90)
Glucose, Bld: 91 mg/dL (ref 70–99)
Potassium: 3.7 mEq/L (ref 3.5–5.1)
Sodium: 142 mEq/L (ref 135–145)
Total Bilirubin: 0.5 mg/dL (ref 0.3–1.2)
Total Protein: 7.5 g/dL (ref 6.0–8.3)

## 2012-02-17 LAB — CBC
HCT: 36.7 % (ref 36.0–46.0)
Hemoglobin: 13 g/dL (ref 12.0–15.0)
MCH: 25.6 pg — ABNORMAL LOW (ref 26.0–34.0)
MCHC: 35.4 g/dL (ref 30.0–36.0)
MCV: 72.4 fL — ABNORMAL LOW (ref 78.0–100.0)
Platelets: 225 10*3/uL (ref 150–400)
RBC: 5.07 MIL/uL (ref 3.87–5.11)
RDW: 14.9 % (ref 11.5–15.5)
WBC: 4.1 10*3/uL (ref 4.0–10.5)

## 2012-02-17 LAB — CARDIAC PANEL(CRET KIN+CKTOT+MB+TROPI)
CK, MB: 2.3 ng/mL (ref 0.3–4.0)
Relative Index: 1.6 (ref 0.0–2.5)
Total CK: 142 U/L (ref 7–177)
Troponin I: <0.3 ng/mL (ref <0.30)

## 2012-02-17 LAB — DIFFERENTIAL
Basophils Absolute: 0 10*3/uL (ref 0.0–0.1)
Basophils Relative: 0 % (ref 0–1)
Eosinophils Absolute: 0.1 K/uL (ref 0.0–0.7)
Eosinophils Relative: 3 % (ref 0–5)
Lymphocytes Relative: 41 % (ref 12–46)
Lymphs Abs: 1.7 10*3/uL (ref 0.7–4.0)
Monocytes Absolute: 0.3 K/uL (ref 0.1–1.0)
Monocytes Relative: 7 % (ref 3–12)
Neutro Abs: 2 10*3/uL (ref 1.7–7.7)
Neutrophils Relative %: 48 % (ref 43–77)

## 2012-02-17 LAB — TSH: TSH: 0.661 u[IU]/mL (ref 0.350–4.500)

## 2012-02-17 NOTE — ED Notes (Signed)
Dr. Ghim at bedside. 

## 2012-02-17 NOTE — ED Notes (Addendum)
Pt sts she has been having cold/sinus congestion off and on and this morning she awoke with a midsternal burning CP. She sts the pain lasted approx. 20 min and then resolved on it's own. Pt sts she took asa 81mg  x2.

## 2012-02-17 NOTE — ED Provider Notes (Signed)
History     CSN: [id number]  Arrival date & time 02/17/12  7829   First MD Initiated Contact with Patient 02/17/12 [phone]      Chief Complaint  Patient presents with  . Chest Pain    (Consider location/radiation/quality/duration/timing/severity/associated sxs/prior treatment) HPI Comments: Patient reports over the last few weeks she has had a multitude of unusual symptoms. She describes that she has had sweats mostly at night, episodes of severe nausea. She recalls an episode of severe pain to her right shoulder and arm which made it difficult to raise her arm for several hours. She reports very mild symptoms of shortness of breath at times. She reports that she's had very minor episodes of substernal chest pain in an isolated region of her sternum that did not radiate at the time. She reports she had another episode of this pain this morning that seemed more intense which is relieved now and 2 to the multitude of symptoms which she reports that she has not had evaluated, she decided to come to the emergency department. Interestingly, the patient does have a history of SVT and has been seen by [facility] cardiovascular in the past. She has had echocardiograms, she thinks she's had a coronary CT scan and a stress test in the past. These have all been unremarkable. She voices some concern because she has a brother who has a history of congestive heart failure. There is no known family history of coronary arterial disease however. Patient is a nonsmoker. She does not have a history of diabetes. She denies pleuritic pain, denies pain with breathing, cough, fever. She denies any unnecessary weight loss or gain. She reports she does have a family history of some thyroid problems but does not recall specifically what that was. She reports that she's had a total abdominal hysterectomy and does not have her ovaries and to me denies that she was on any type of estrogen replacement, however her MAR indicates  that she does use estradiol tablets.  The history is provided by the patient.    Past Medical History  Diagnosis Date  . Hypertension   . Depression   . Migraine   . Irregular heart rhythm     unclear what arrhythmia  . Pneumothorax   . Torus palatinus   . UTI (urinary tract infection)   . Supraventricular tachycardia, paroxysmal     Past Surgical History  Procedure Date  . Cesarean section     x 2  . Abdominal hysterectomy 2005  . Pleural scarification     Family History  Problem Relation Age of Onset  . Depression Mother   . Hypertension Father   . Arthritis Other   . Coronary artery disease Other   . Stroke Other   . Mental illness Other   . Heart failure Brother   . Hypertension Maternal Aunt     History  Substance Use Topics  . Smoking status: Never Smoker   . Smokeless tobacco: Never Used  . Alcohol Use: No    OB History    Grav Para Term Preterm Abortions TAB SAB Ect Mult Living   2 2              Review of Systems  Constitutional: Negative for fever, chills, activity change and unexpected weight change.  Respiratory: Positive for shortness of breath. Negative for chest tightness.   Cardiovascular: Positive for chest pain and palpitations. Negative for leg swelling.  Genitourinary: Negative for menstrual problem and pelvic pain.  Musculoskeletal: Negative for back pain.  Skin: Negative for rash and wound.  All other systems reviewed and are negative.    Allergies  Review of patient's allergies indicates no known allergies.  Home Medications   Current Outpatient Rx  Name Route Sig Dispense Refill  . B COMPLEX PO TABS Oral Take 1 tablet by mouth daily.    Marland Kitchen VITAMIN D 1000 UNITS PO TABS Oral Take 1,000 Units by mouth daily.      Marland Kitchen ESTRADIOL 10 MCG VA TABS Vaginal Place 1 tablet (10 mcg total) vaginally every morning. 1 per vagina at hs for 2 weeks then 2 times per week 8 tablet 12  . LORATADINE 10 MG PO TABS Oral Take 1 tablet (10 mg total)  by mouth daily. 7 tablet 0  . NEBIVOLOL HCL 20 MG PO TABS Oral Take 1 tablet (20 mg total) by mouth daily. 30 tablet 3    BP 153/96  Pulse 63  Temp(Src) 98.2 F (36.8 C) (Oral)  Resp 18  SpO2 100%  Physical Exam  Nursing note and vitals reviewed. Constitutional: She appears well-developed and well-nourished. No distress.  HENT:  Head: Normocephalic and atraumatic.  Eyes: Pupils are equal, round, and reactive to light.  Neck: Neck supple.  Cardiovascular: Normal rate and regular rhythm.   No murmur heard. Pulmonary/Chest: Effort normal and breath sounds normal.  Abdominal: Soft. She exhibits no distension. There is no tenderness.  Musculoskeletal: Normal range of motion. She exhibits no edema and no tenderness.  Neurological: She is alert.  Skin: Skin is warm and dry. She is not diaphoretic.  Psychiatric: She has a normal mood and affect.    ED Course  Procedures (including critical care time)  Labs Reviewed  CBC - Abnormal; Notable for the following:    MCV 72.4 (*)    MCH 25.6 (*)    All other components within normal limits  DIFFERENTIAL  COMPREHENSIVE METABOLIC PANEL  CARDIAC PANEL(CRET KIN+CKTOT+MB+TROPI)  TSH   Dg Chest 2 View  02/17/2012  *RADIOLOGY REPORT*  Clinical Data: Chest pain radiating into the right arm.  Shortness of breath.  Prior history of spontaneous pneumothorax.  CHEST - 2 VIEW 02/17/2012:  Comparison: Two-view chest x-ray 11/17/2011 [facility] and 03/24/2011 [facility].  Findings: Cardiac silhouette mildly enlarged but stable.  Hilar and mediastinal contours otherwise unremarkable.  Pulmonary vascularity normal.  Pleuroparenchymal scarring at the right lung apex with surgical suture material, presumably from prior bullectomy.  Lungs otherwise clear.  Bronchovascular markings normal.  No pleural effusions.  Visualized bony thorax intact.  IMPRESSION: No acute cardiopulmonary disease.  Stable mild cardiomegaly. Stable  pleuroparenchymal scarring at the right lung apex.  Original Report Authenticated By: Arnell Sieving, M.D.     1. Chest tightness     EKG at time on 926, shows normal sinus rhythm at a rate of 62. Incomplete right bundle branch block is noted. Left ventricular hypertrophy is noted. Nonspecific T-wave inversions are noted inferiorly and laterally. There is no significant change from an EKG performed on 03/24/2011.  MDM   Patient's symptoms are very atypical for coronary disease. Patient reports that her chest pain and symptoms are resolved at this moment. Her preliminary blood tests including troponin and electrolytes are completely normal. Other than her blood pressure being minimally elevated, her vital signs are normal including a normal room air saturation 100%. Patient is reassured that her blood tests are normal. I did add a TSH to her lab tests and  informed her that she could followup with her primary care provider who can also address her ongoing symptoms. To me her symptoms sound a little bit more systemic. Any emergent condition is ruled out at this time and if she is stable for discharge home        Gavin Pound. Emillie Chasen, MD 02/17/12 1318

## 2012-02-17 NOTE — Discharge Instructions (Signed)
Chest Pain, Nonspecific  It is often hard to give a specific diagnosis for the cause of chest pain. There is always a chance that your pain could be related to something serious, like a heart attack or a blood clot in the lungs. You need to follow up with your caregiver for further evaluation. More lab tests or other studies such as X-rays, electrocardiography, stress testing, or cardiac imaging may be needed to find the cause of your pain.  Most of the time, nonspecific chest pain improves within 2 to 3 days with rest and mild pain medicine. For the next few days, avoid physical exertion or activities that bring on pain. Do not smoke. Avoid drinking alcohol. Call your caregiver for routine follow-up as advised.   SEEK IMMEDIATE MEDICAL CARE IF:   You develop increased chest pain or pain that radiates to the arm, neck, jaw, back, or abdomen.   You develop shortness of breath, increased coughing, or you start coughing up blood.   You have severe back or abdominal pain, nausea, or vomiting.   You develop severe weakness, fainting, fever, or chills.  Document Released: 11/20/2005 Document Revised: 11/09/2011 Document Reviewed: 05/10/2007  [employer] Patient Information 2012 [employer].

## 2012-05-01 ENCOUNTER — Other Ambulatory Visit: Payer: Self-pay | Admitting: Family

## 2012-05-01 NOTE — Telephone Encounter (Signed)
30 day supply bystolic sent to pharmacy. Pt needs follow up before current supply runs out. Please call pt to arrange f/u.

## 2012-05-02 NOTE — Telephone Encounter (Signed)
Informed patient that a 30 day supply of bystolic has been sent to the pharmacy and that she does need to be seen before additional refills can be given. She states that she will call back to schedule this appointment.

## 2012-05-31 ENCOUNTER — Other Ambulatory Visit: Payer: Self-pay | Admitting: Family

## 2012-05-31 NOTE — Telephone Encounter (Signed)
Rx refill sent to pharmacy. 

## 2012-06-07 ENCOUNTER — Ambulatory Visit: Payer: BC Managed Care – PPO | Admitting: Family

## 2012-06-14 ENCOUNTER — Encounter: Payer: Self-pay | Admitting: Family

## 2012-06-14 ENCOUNTER — Ambulatory Visit (INDEPENDENT_AMBULATORY_CARE_PROVIDER_SITE_OTHER): Payer: BC Managed Care – PPO | Admitting: Family

## 2012-06-14 VITALS — BP 90/68 | HR 64 | Resp 16 | Ht 70.0 in | Wt 167.0 lb

## 2012-06-14 DIAGNOSIS — I10 Essential (primary) hypertension: Secondary | ICD-10-CM

## 2012-06-14 MED ORDER — NEBIVOLOL HCL 20 MG PO TABS: 20.0000 mg | ORAL_TABLET | Freq: Every day | ORAL | Status: DC

## 2012-06-14 NOTE — Patient Instructions (Addendum)
Please check your BP once daily at home for the next 1 week and contact us with your results. Please schedule a follow up appointment in 3 months. Have a nice Summer!

## 2012-06-14 NOTE — Progress Notes (Signed)
  Subjective:    Patient ID: [name], female    DOB: [birth year], 51 y.o.   MRN: [id number]  HPI   HTN- reports feeling well on current dose of bystolic.  Repeat BP- attempted manually, difficult to hear.  Repeated with automatic cuff- 126/92.  Pt denies CP, SOB or lower extremity edema.   BP Readings from Last 3 Encounters:  06/14/12 90/68  02/17/12 153/96  02/01/12 130/90      Review of Systems    see HPI  Past Medical History  Diagnosis Date  . Hypertension   . Depression   . Migraine   . Irregular heart rhythm     unclear what arrhythmia  . Pneumothorax   . Torus palatinus   . UTI (urinary tract infection)   . Supraventricular tachycardia, paroxysmal     History   Social History  . Marital Status: Married    Spouse Name: N/A    Number of Children: 2  . Years of Education: N/A   Occupational History  . SALES    Social History Main Topics  . Smoking status: Never Smoker   . Smokeless tobacco: Never Used  . Alcohol Use: No  . Drug Use: No  . Sexually Active: Yes   Other Topics Concern  . Not on file   Social History Narrative  . No narrative on file    Past Surgical History  Procedure Date  . Cesarean section     x 2  . Abdominal hysterectomy 2005  . Pleural scarification     Family History  Problem Relation Age of Onset  . Depression Mother   . Hypertension Father   . Arthritis Other   . Coronary artery disease Other   . Stroke Other   . Mental illness Other   . Heart failure Brother   . Hypertension Maternal Aunt     No Known Allergies  Current Outpatient Prescriptions on File Prior to Visit  Medication Sig Dispense Refill  . b complex vitamins tablet Take 1 tablet by mouth daily.      . cholecalciferol (VITAMIN D) 1000 UNITS tablet Take 1,000 Units by mouth daily.        . Estradiol (VAGIFEM) 10 MCG TABS Place 1 tablet (10 mcg total) vaginally every morning. 1 per vagina at hs for 2 weeks then 2 times per week  8 tablet  12   . loratadine (CLARITIN) 10 MG tablet Take 1 tablet (10 mg total) by mouth daily.  7 tablet  0  . DISCONTD: Nebivolol HCl (BYSTOLIC) 20 MG TABS Take 1 tablet (20 mg total) by mouth daily. Office visit is required for refills  30 tablet  0    BP 90/68  Pulse 64  Resp 16  Ht 5\' 10"  (1.778 m)  Wt 167 lb (75.751 kg)  BMI 23.96 kg/m2  SpO2 99%    Objective:   Physical Exam  Constitutional: She appears well-developed and well-nourished. No distress.  Cardiovascular: Normal rate and regular rhythm.   No murmur heard. Pulmonary/Chest: Effort normal and breath sounds normal. No respiratory distress. She has no wheezes. She has no rales. She exhibits no tenderness.  Musculoskeletal: She exhibits no edema.  Psychiatric: She has a normal mood and affect. Her behavior is normal. Judgment and thought content normal.          Assessment & Plan:

## 2012-06-14 NOTE — Assessment & Plan Note (Signed)
Not certain that BP reading of 90/68 is accurate.   I have asked pt to continue current dose of bystolic.  She has home BP cuff.  She will check once daily and call us in 1 week with her readings.

## 2012-07-31 ENCOUNTER — Telehealth: Payer: Self-pay | Admitting: *Deleted

## 2012-07-31 MED ORDER — NEBIVOLOL HCL 20 MG PO TABS: 20.0000 mg | ORAL_TABLET | Freq: Every day | ORAL | Status: DC

## 2012-07-31 NOTE — Telephone Encounter (Signed)
Received message from pt's husband that pt will not be able to get her Bystolic until the middle of next month and is requesting samples. 2 boxes have been left up front and pt's husband notified.

## 2012-08-07 ENCOUNTER — Ambulatory Visit (INDEPENDENT_AMBULATORY_CARE_PROVIDER_SITE_OTHER): Payer: BC Managed Care – PPO | Admitting: Family

## 2012-08-07 ENCOUNTER — Encounter: Payer: Self-pay | Admitting: Family

## 2012-08-07 VITALS — BP 102/68 | HR 58 | Temp 98.1°F | Resp 16 | Wt 170.1 lb

## 2012-08-07 DIAGNOSIS — J329 Chronic sinusitis, unspecified: Secondary | ICD-10-CM | POA: Insufficient documentation

## 2012-08-07 MED ORDER — AMOXICILLIN-POT CLAVULANATE 875-125 MG PO TABS: 1.0000 | ORAL_TABLET | Freq: Two times a day (BID) | ORAL | Status: AC

## 2012-08-07 NOTE — Patient Instructions (Addendum)
Please call if symptoms worsen or if you are not feeling better in 2-3 days.  

## 2012-08-07 NOTE — Progress Notes (Signed)
Subjective:    Patient ID: [name], female    DOB: July 22, 1961, 51 y.o.   MRN: [id number]  HPI  [name] is a 51 yr old female who presents today with chief complaint of nasal congestion. She reports that symptoms started about 10 days ago and are associated with fatigue.   Symptoms worsened Sunday 9/1.  She denies associated fever but notes a few chills.  Tried claritin without improvement.  Nasal drainage is clear but she had some brown phlegm with cough.     Review of Systems See HPI  Past Medical History  Diagnosis Date  . Hypertension   . Depression   . Migraine   . Irregular heart rhythm     unclear what arrhythmia  . Pneumothorax   . Torus palatinus   . UTI (urinary tract infection)   . Supraventricular tachycardia, paroxysmal     History   Social History  . Marital Status: Married    Spouse Name: N/A    Number of Children: 2  . Years of Education: N/A   Occupational History  . SALES    Social History Main Topics  . Smoking status: Never Smoker   . Smokeless tobacco: Never Used  . Alcohol Use: No  . Drug Use: No  . Sexually Active: Yes   Other Topics Concern  . Not on file   Social History Narrative  . No narrative on file    Past Surgical History  Procedure Date  . Cesarean section     x 2  . Abdominal hysterectomy 2005  . Pleural scarification     Family History  Problem Relation Age of Onset  . Depression Mother   . Hypertension Father   . Arthritis Other   . Coronary artery disease Other   . Stroke Other   . Mental illness Other   . Heart failure Brother   . Hypertension Maternal Aunt     No Known Allergies  Current Outpatient Prescriptions on File Prior to Visit  Medication Sig Dispense Refill  . Estradiol (VAGIFEM) 10 MCG TABS Place 1 tablet (10 mcg total) vaginally every morning. 1 per vagina at hs for 2 weeks then 2 times per week  8 tablet  12  . loratadine (CLARITIN) 10 MG tablet Take 1 tablet (10 mg total) by  mouth daily.  7 tablet  0  . Nebivolol HCl (BYSTOLIC) 20 MG TABS Take 1 tablet (20 mg total) by mouth daily. Office visit is required for refills  14 tablet  0  . b complex vitamins tablet Take 1 tablet by mouth daily.      . cholecalciferol (VITAMIN D) 1000 UNITS tablet Take 1,000 Units by mouth daily.          BP 102/68  Pulse 58  Temp 98.1 F (36.7 C) (Oral)  Resp 16  Wt 170 lb 1.9 oz (77.166 kg)  SpO2 97%       Objective:   Physical Exam  Constitutional: She appears well-developed and well-nourished. No distress.  HENT:  Head: Normocephalic and atraumatic.  Right Ear: Tympanic membrane and ear canal normal.  Left Ear: Tympanic membrane and ear canal normal.  Mouth/Throat: No oropharyngeal exudate, posterior oropharyngeal edema, posterior oropharyngeal erythema or tonsillar abscesses.  Cardiovascular: Normal rate and regular rhythm.   No murmur heard. Pulmonary/Chest: Effort normal and breath sounds normal. No respiratory distress. She has no wheezes. She has no rales. She exhibits no tenderness.  Assessment & Plan:

## 2012-08-07 NOTE — Assessment & Plan Note (Signed)
Will rx with augmentin.  

## 2012-09-27 ENCOUNTER — Telehealth: Payer: Self-pay | Admitting: Family

## 2012-09-27 MED ORDER — NEBIVOLOL HCL 20 MG PO TABS: 20.0000 mg | ORAL_TABLET | Freq: Every day | ORAL | Status: DC

## 2012-09-27 NOTE — Telephone Encounter (Signed)
Pt presented to front office requesting bystolic samples.  #21 provided.

## 2012-12-09 ENCOUNTER — Telehealth: Payer: Self-pay | Admitting: Family

## 2012-12-09 MED ORDER — NEBIVOLOL HCL 20 MG PO TABS: 20.0000 mg | ORAL_TABLET | Freq: Every day | ORAL | Status: DC

## 2012-12-09 NOTE — Telephone Encounter (Signed)
Refill- bystolic 20mg  tablets. Take one tablet by mouth every day. Qty 30 last fill 11.9.13

## 2012-12-09 NOTE — Telephone Encounter (Signed)
Informed patient of medication refill and she states that she will have to call back to schedule follow up BP appointment.

## 2012-12-09 NOTE — Telephone Encounter (Signed)
Bystolic refill sent to pharmacy. Please call pt and arrange follow up this month as she was due for follow up of her BP in October and is past due.

## 2012-12-24 ENCOUNTER — Ambulatory Visit (INDEPENDENT_AMBULATORY_CARE_PROVIDER_SITE_OTHER): Payer: BC Managed Care – PPO | Admitting: Family

## 2012-12-24 VITALS — BP 118/72 | HR 50 | Temp 98.7°F | Resp 12 | Wt 174.0 lb

## 2012-12-24 DIAGNOSIS — R001 Bradycardia, unspecified: Secondary | ICD-10-CM

## 2012-12-24 DIAGNOSIS — J029 Acute pharyngitis, unspecified: Secondary | ICD-10-CM

## 2012-12-24 DIAGNOSIS — I498 Other specified cardiac arrhythmias: Secondary | ICD-10-CM

## 2012-12-24 MED ORDER — LORATADINE 10 MG PO TABS: 10.0000 mg | ORAL_TABLET | Freq: Every day | ORAL | Status: DC

## 2012-12-24 MED ORDER — IBUPROFEN 200 MG PO TABS: 400.0000 mg | ORAL_TABLET | Freq: Four times a day (QID) | ORAL | Status: DC | PRN

## 2012-12-24 NOTE — Patient Instructions (Addendum)
Please call if your symptoms worsen, or if not improved in 1 week. You will be contacted about your appointment with Dr. Herbie Baltimore.  Please let us know if you have not heard back within 1 week about your referral.  Upper Respiratory Infection, Adult An upper respiratory infection (URI) is also known as the common cold. It is often caused by a type of germ (virus). Colds are easily spread (contagious). You can pass it to others by kissing, coughing, sneezing, or drinking out of the same glass. Usually, you get better in 1 or 2 weeks.  HOME CARE   Only take medicine as told by your doctor.  Use a warm mist humidifier or breathe in steam from a hot shower.  Drink enough water and fluids to keep your pee (urine) clear or pale yellow.  Get plenty of rest.  Return to work when your temperature is back to normal or as told by your doctor. You may use a face mask and wash your hands to stop your cold from spreading. GET HELP RIGHT AWAY IF:   After the first few days, you feel you are getting worse.  You have questions about your medicine.  You have chills, shortness of breath, or brown or red spit (mucus).  You have yellow or brown snot (nasal discharge) or pain in the face, especially when you bend forward.  You have a fever, puffy (swollen) neck, pain when you swallow, or white spots in the back of your throat.  You have a bad headache, ear pain, sinus pain, or chest pain.  You have a high-pitched whistling sound when you breathe in and out (wheezing).  You have a lasting cough or cough up blood.  You have sore muscles or a stiff neck. MAKE SURE YOU:   Understand these instructions.  Will watch your condition.  Will get help right away if you are not doing well or get worse. Document Released: 05/08/2008 Document Revised: 02/12/2012 Document Reviewed: 03/27/2011 [facility] Patient Information 2013 [place], [place].

## 2012-12-24 NOTE — Progress Notes (Signed)
Subjective:    Patient ID: [name], female    DOB: 07/26/1961, 52 y.o.   MRN: [id number]  HPI  [name] is a 52 yr old female who presents today with chief complaint of sinus congestion and sore throat.  She reports that her symptoms started yesterday and are associated with "eyes running".  Symptoms started lat yesterday, eyes running.  Bad sore throat, irritated.  Son had URI.  No known fever. Tylenol sinus without improvement. She did have a flu shot this season.     Review of Systems See HPI  Past Medical History  Diagnosis Date  . Hypertension   . Depression   . Migraine   . Irregular heart rhythm     unclear what arrhythmia  . Pneumothorax   . Torus palatinus   . UTI (urinary tract infection)   . Supraventricular tachycardia, paroxysmal     History   Social History  . Marital Status: Married    Spouse Name: N/A    Number of Children: 2  . Years of Education: N/A   Occupational History  . SALES    Social History Main Topics  . Smoking status: Never Smoker   . Smokeless tobacco: Never Used  . Alcohol Use: No  . Drug Use: No  . Sexually Active: Yes   Other Topics Concern  . Not on file   Social History Narrative  . No narrative on file    Past Surgical History  Procedure Date  . Cesarean section     x 2  . Abdominal hysterectomy 2005  . Pleural scarification     Family History  Problem Relation Age of Onset  . Depression Mother   . Hypertension Father   . Arthritis Other   . Coronary artery disease Other   . Stroke Other   . Mental illness Other   . Heart failure Brother   . Hypertension Maternal Aunt     No Known Allergies  Current Outpatient Prescriptions on File Prior to Visit  Medication Sig Dispense Refill  . b complex vitamins tablet Take 1 tablet by mouth daily.      . cholecalciferol (VITAMIN D) 1000 UNITS tablet Take 1,000 Units by mouth daily.        . Estradiol (VAGIFEM) 10 MCG TABS Place 1 tablet (10 mcg total)  vaginally every morning. 1 per vagina at hs for 2 weeks then 2 times per week  8 tablet  12  . loratadine (CLARITIN) 10 MG tablet Take 1 tablet (10 mg total) by mouth daily.  7 tablet  0  . Nebivolol HCl (BYSTOLIC) 20 MG TABS Take 1 tablet (20 mg total) by mouth daily. Office visit is required for refills  30 tablet  2    BP 118/72  Pulse 50  Temp 98.7 F (37.1 C) (Oral)  Resp 12  Wt 174 lb (78.926 kg)  SpO2 99%       Objective:   Physical Exam  Constitutional: She appears well-developed and well-nourished. No distress.  HENT:  Head: Normocephalic and atraumatic.  Right Ear: Tympanic membrane and ear canal normal.  Left Ear: Tympanic membrane and ear canal normal.  Mouth/Throat: No oropharyngeal exudate, posterior oropharyngeal edema or posterior oropharyngeal erythema.  Cardiovascular: Normal rate and regular rhythm.   No murmur heard. Pulmonary/Chest: Effort normal and breath sounds normal. No respiratory distress. She has no wheezes. She has no rales. She exhibits no tenderness.  Musculoskeletal: She exhibits no edema.  Skin: Skin is  warm and dry.  Psychiatric: She has a normal mood and affect. Her behavior is normal. Judgment and thought content normal.          Assessment & Plan:  Symptoms most consistent with URI. Rapid flu test neg, rapid strep neg.  Recommended supportive measures and to call if symptoms worsen or if not resolved by 1 week.

## 2012-12-27 ENCOUNTER — Encounter: Payer: Self-pay | Admitting: Cardiology

## 2012-12-27 ENCOUNTER — Ambulatory Visit (INDEPENDENT_AMBULATORY_CARE_PROVIDER_SITE_OTHER): Payer: BC Managed Care – PPO | Admitting: Cardiology

## 2012-12-27 VITALS — BP 128/74 | HR 59 | Ht 70.0 in | Wt 174.0 lb

## 2012-12-27 DIAGNOSIS — I471 Supraventricular tachycardia: Secondary | ICD-10-CM | POA: Insufficient documentation

## 2012-12-27 DIAGNOSIS — I498 Other specified cardiac arrhythmias: Secondary | ICD-10-CM

## 2012-12-27 DIAGNOSIS — I10 Essential (primary) hypertension: Secondary | ICD-10-CM

## 2012-12-27 NOTE — Assessment & Plan Note (Signed)
Continue beta blocker. 

## 2012-12-27 NOTE — Assessment & Plan Note (Signed)
Patient has a history of SVT.I doubt that a heart rate of 50 is contributing to her fatigue. I would continue present dose of beta blocker. If she has more frequent episodes in the future she could be considered for ablation. Note she has had an extensive workup previously at [facility] heart and vascular. She was satisfied with her care and she will followup with them for future management. If her symptoms of dyspnea do not improve an echocardiogram can be repeated to reassess LV function.

## 2012-12-27 NOTE — Patient Instructions (Addendum)
Your physician recommends that you schedule a follow-up appointment in: AS NEEDED  

## 2012-12-27 NOTE — Progress Notes (Signed)
HPI: 52 year old female seen for bradycardia and fatigue. Patient apparently has seen cardiology intermittently for years for palpitations and one bout of SVT. She saw Dr. Herbie Baltimore at [facility] approximately one year ago by her report. She has had an echocardiogram, stress test and monitor by her report but I do not have those records available. The only documented history of abnormality is PVCs and SVT. She was seen recently by primary care. She had a recent viral syndrome and has noticed increased dyspnea on exertion. No orthopnea, PND, pedal edema, exertional chest pain or syncope. She has noticed fatigue and her heart rate was 50. Cardiology was asked to evaluate to see if this was contributing.  Current Outpatient Prescriptions  Medication Sig Dispense Refill  . Estradiol (VAGIFEM) 10 MCG TABS Place 1 tablet (10 mcg total) vaginally every morning. 1 per vagina at hs for 2 weeks then 2 times per week  8 tablet  12  . ibuprofen (ADVIL,MOTRIN) 200 MG tablet Take 2 tablets (400 mg total) by mouth every 6 (six) hours as needed for pain.  50 tablet  0  . loratadine (CLARITIN) 10 MG tablet Take 10 mg by mouth as needed.      . Nebivolol HCl (BYSTOLIC) 20 MG TABS Take 1 tablet (20 mg total) by mouth daily. Office visit is required for refills  30 tablet  2    No Known Allergies  Past Medical History  Diagnosis Date  . Hypertension   . Depression   . Migraine   . Irregular heart rhythm     unclear what arrhythmia  . Pneumothorax   . Torus palatinus   . Supraventricular tachycardia, paroxysmal     Past Surgical History  Procedure Date  . Cesarean section     x 2  . Abdominal hysterectomy 2005  . Pleural scarification     History   Social History  . Marital Status: Married    Spouse Name: N/A    Number of Children: 2  . Years of Education: N/A   Occupational History  . SALES    Social History Main Topics  . Smoking status: Never Smoker   . Smokeless  tobacco: Never Used  . Alcohol Use: No  . Drug Use: No  . Sexually Active: Yes   Other Topics Concern  . Not on file   Social History Narrative  . No narrative on file    Family History  Problem Relation Age of Onset  . Depression Mother   . Hypertension Father   . Arthritis Other   . Coronary artery disease Father   . Stroke Other   . Mental illness Other   . Heart failure Brother   . Hypertension Maternal Aunt     ROS: fatigue no fevers or chills, productive cough, hemoptysis, dysphasia, odynophagia, melena, hematochezia, dysuria, hematuria, rash, seizure activity, orthopnea, PND, pedal edema, claudication. Remaining systems are negative.  Physical Exam:   Blood pressure 128/74, pulse 59, height 5\' 10"  (1.778 m), weight 174 lb (78.926 kg).  General:  Well developed/well nourished in NAD Skin warm/dry Patient not depressed No peripheral clubbing Back-normal HEENT-normal/normal eyelids Neck supple/normal carotid upstroke bilaterally; no bruits; no JVD; no thyromegaly chest - CTA/ normal expansion CV - RRR/normal S1 and S2; no murmurs, rubs or gallops;  PMI nondisplaced Abdomen -NT/ND, no HSM, no mass, + bowel sounds, no bruit 2+ femoral pulses, no bruits Ext-no edema, chords, 2+ DP Neuro-grossly nonfocal  ECG sinus bradycardia at a rate of  59. Occasional PVCs. Right bundle branch block. Left ventricular hypertrophy. Cannot rule out prior septal infarct. Inferior lateral T-wave changes.

## 2013-01-06 ENCOUNTER — Telehealth: Payer: Self-pay | Admitting: Family

## 2013-01-06 NOTE — Telephone Encounter (Signed)
She should be seen back in office for visit please.  Could have pneumonia and will need CXR and evaluation.

## 2013-01-06 NOTE — Telephone Encounter (Signed)
Please advise re: appt. Vs Rx. Pt just seen on 12/24/12 for sore throat and sinus congestion.

## 2013-01-06 NOTE — Telephone Encounter (Signed)
Caller:  Gera Inboden number [phone]    Alternate callback number  [phone]  Cell phone  Office follow up needed - yes Instructions:  Please follow up with patient regarding possible work in appointment at office today regarding cough with brownish sputum, subjective fever.  Thank you.    Symptoms:  Congestion, hoarsenss, itchy throat.  Cough producing brownish sputm onset 01/06/13.  Fever/subjective (cannot get warm in front of heater).     Reviewed Health History, Medications, Allergies, Surgeries - yes  Treatments Tried:  Cough drops, Claritin  Treatments Tried Worked:  No Any Fever:  Yes- subjective  OB/BYN:  LMP Unknown  Guideline Used:  Cough  Disposition Per Guideline:  See Today in office  Reason for Disposition:  Coughing up rusty colored or blood-tinged sputum.  Advice Given:   Coughing is the way that our lungs remove irritants Cough Medicines- OTC Cough Drops or Hard Candy Drink warm fluids.  Inhale warm mist Drink adequate liquids.   Fever medicines:  Tylenol 650 mg q 4-6h prn fever/ pain  Call Back If: Difficulty breathing You become worse.

## 2013-01-07 NOTE — Telephone Encounter (Signed)
Spoke with pt. She reports that she is feeling some better today and is unable to be seen in the office today. Scheduled appt for tomorrow at 1:30pm.

## 2013-01-08 ENCOUNTER — Ambulatory Visit: Payer: BC Managed Care – PPO | Admitting: Family

## 2013-02-12 ENCOUNTER — Telehealth: Payer: Self-pay | Admitting: Family

## 2013-02-12 MED ORDER — NEBIVOLOL HCL 20 MG PO TABS: 20.0000 mg | ORAL_TABLET | Freq: Every day | ORAL | Status: DC

## 2013-02-12 NOTE — Telephone Encounter (Signed)
Refill- bystolic 20mg  tabs. TK 1 T PO D. (office visit is required for refills). Qty 30 last fill 3.8.14

## 2013-02-12 NOTE — Telephone Encounter (Signed)
Refill sent.

## 2013-02-27 ENCOUNTER — Telehealth: Payer: Self-pay | Admitting: Family

## 2013-02-27 MED ORDER — LORATADINE 10 MG PO TABS: 10.0000 mg | ORAL_TABLET | ORAL | Status: DC | PRN

## 2013-02-27 NOTE — Telephone Encounter (Signed)
Refill sent.

## 2013-02-27 NOTE — Telephone Encounter (Signed)
Refill- loratadine 10mg  tablets. Take one tablet by mouth daily. Qty 30 last fill 1.21.14

## 2013-04-23 ENCOUNTER — Encounter: Payer: Self-pay | Admitting: Internal Medicine

## 2013-05-09 ENCOUNTER — Encounter: Payer: Self-pay | Admitting: Internal Medicine

## 2013-05-14 ENCOUNTER — Ambulatory Visit: Payer: BC Managed Care – PPO | Admitting: Internal Medicine

## 2013-05-14 ENCOUNTER — Telehealth: Payer: Self-pay | Admitting: Family

## 2013-05-14 NOTE — Telephone Encounter (Signed)
Refill- bysotolic 20mg  tablets. TK 1 T PO D. QTY 30 LAST FILL 6.4.14

## 2013-05-15 MED ORDER — NEBIVOLOL HCL 20 MG PO TABS: 20.0000 mg | ORAL_TABLET | Freq: Every day | ORAL | Status: DC

## 2013-05-15 NOTE — Telephone Encounter (Signed)
REfill sent. 

## 2013-05-19 ENCOUNTER — Telehealth: Payer: Self-pay | Admitting: Family

## 2013-05-19 MED ORDER — LORATADINE 10 MG PO TABS: 10.0000 mg | ORAL_TABLET | ORAL | Status: DC | PRN

## 2013-05-19 NOTE — Telephone Encounter (Signed)
rx sent to pharmacy by e-script  

## 2013-05-19 NOTE — Telephone Encounter (Signed)
Loratadine 10 mg tablet take 1 tablet by mouth every day as needed qty 30 last fill 04-14-2013

## 2013-06-30 ENCOUNTER — Other Ambulatory Visit: Payer: Self-pay | Admitting: Family

## 2013-06-30 DIAGNOSIS — Z1231 Encounter for screening mammogram for malignant neoplasm of breast: Secondary | ICD-10-CM

## 2013-07-03 ENCOUNTER — Encounter: Payer: Self-pay | Admitting: Family Medicine

## 2013-07-03 ENCOUNTER — Ambulatory Visit (INDEPENDENT_AMBULATORY_CARE_PROVIDER_SITE_OTHER): Payer: BC Managed Care – PPO | Admitting: Family Medicine

## 2013-07-03 VITALS — BP 138/90 | HR 79 | Temp 98.6°F | Ht 70.0 in | Wt 178.1 lb

## 2013-07-03 DIAGNOSIS — F411 Generalized anxiety disorder: Secondary | ICD-10-CM

## 2013-07-03 DIAGNOSIS — I10 Essential (primary) hypertension: Secondary | ICD-10-CM

## 2013-07-04 ENCOUNTER — Ambulatory Visit (HOSPITAL_BASED_OUTPATIENT_CLINIC_OR_DEPARTMENT_OTHER)
Admission: RE | Admit: 2013-07-04 | Discharge: 2013-07-04 | Disposition: A | Discharge status: Home / Self Care | Payer: BC Managed Care – PPO | Source: Ambulatory Visit | Attending: Family | Admitting: Family

## 2013-07-04 ENCOUNTER — Telehealth: Payer: Self-pay | Admitting: Family

## 2013-07-04 ENCOUNTER — Encounter: Payer: Self-pay | Admitting: Family Medicine

## 2013-07-04 DIAGNOSIS — Z1231 Encounter for screening mammogram for malignant neoplasm of breast: Secondary | ICD-10-CM | POA: Insufficient documentation

## 2013-07-04 MED ORDER — ALPRAZOLAM 0.25 MG PO TABS: 0.2500 mg | ORAL_TABLET | Freq: Every evening | ORAL | Status: DC | PRN

## 2013-07-04 NOTE — Telephone Encounter (Signed)
Called pt no answer LMOM with md response. Med call into [facility]...lmb

## 2013-07-04 NOTE — Telephone Encounter (Signed)
WAS IN YESTERDAY AND YOU OFFERED SOMETHING FOR ANXIETY  SHE HAS DECIDED SHE NEEDS SOMETHING.  [facility] [address]

## 2013-07-04 NOTE — Progress Notes (Signed)
Patient ID: [name], female   DOB: 24-Jan-1961, 52 y.o.   MRN: [id number] [name] [id number] 17-Sep-1961 07/04/2013      Progress Note-Follow Up  Subjective  Chief Complaint  Chief Complaint  Patient presents with  . Anxiety    and stress    HPI  Patient is a 52 year old African American female who is in today to discuss worsening anxiety. Her husband who has bipolar disorder has been decompensating and is becoming increasingly incoherent and hallucinating. He is rageful moments. So far she does not feel physically in danger but she worries about her youngest son who tends to confluent his father. She has even been advised by her husbands own mother to leave for home. She is tearful in the office. She denies headache but does have some palpitations. No chest pain or shortness of breath  Past Medical History  Diagnosis Date  . Hypertension   . Depression   . Migraine   . Irregular heart rhythm     unclear what arrhythmia  . Pneumothorax   . Torus palatinus   . Supraventricular tachycardia, paroxysmal     Past Surgical History  Procedure Laterality Date  . Cesarean section      x 2  . Abdominal hysterectomy  2005  . Pleural scarification      Family History  Problem Relation Age of Onset  . Depression Mother   . Hypertension Father   . Arthritis Other   . Coronary artery disease Father   . Stroke Other   . Mental illness Other   . Heart failure Brother   . Hypertension Maternal Aunt     History   Social History  . Marital Status: Married    Spouse Name: N/A    Number of Children: 2  . Years of Education: N/A   Occupational History  . SALES    Social History Main Topics  . Smoking status: Never Smoker   . Smokeless tobacco: Never Used  . Alcohol Use: No  . Drug Use: No  . Sexually Active: Yes   Other Topics Concern  . Not on file   Social History Narrative  . No narrative on file    Current Outpatient Prescriptions on File Prior to  Visit  Medication Sig Dispense Refill  . Nebivolol HCl (BYSTOLIC) 20 MG TABS Take 1 tablet (20 mg total) by mouth daily. Office visit is required for refills  30 tablet  2  . loratadine (CLARITIN) 10 MG tablet Take 1 tablet (10 mg total) by mouth as needed.  30 tablet  2   No current facility-administered medications on file prior to visit.    No Known Allergies  Review of Systems  Review of Systems  Constitutional: Positive for malaise/fatigue. Negative for fever.  HENT: Negative for congestion.   Eyes: Negative for pain.  Respiratory: Negative for shortness of breath.   Cardiovascular: Negative for chest pain and palpitations.  Gastrointestinal: Negative for nausea, abdominal pain and diarrhea.  Genitourinary: Negative for dysuria.  Musculoskeletal: Negative for falls.  Skin: Negative for rash.  Neurological: Positive for headaches. Negative for loss of consciousness.  Endo/Heme/Allergies: Negative for polydipsia.  Psychiatric/Behavioral: Positive for depression. Negative for suicidal ideas. The patient is nervous/anxious. The patient does not have insomnia.     Objective  BP 138/90  Pulse 79  Temp(Src) 98.6 F (37 C) (Oral)  Ht 5\' 10"  (1.778 m)  Wt 178 lb 1.3 oz (80.777 kg)  BMI 25.55 kg/m2  SpO2  97%  Physical Exam  Physical Exam  Constitutional: She is oriented to person, place, and time and well-developed, well-nourished, and in no distress. No distress.  HENT:  Head: Normocephalic and atraumatic.  Eyes: Conjunctivae are normal.  Neck: Neck supple. No thyromegaly present.  Cardiovascular: Normal rate and regular rhythm.  Exam reveals no gallop.   No murmur heard. Pulmonary/Chest: Effort normal and breath sounds normal. She has no wheezes.  Abdominal: She exhibits no distension and no mass.  Musculoskeletal: She exhibits no edema.  Lymphadenopathy:    She has no cervical adenopathy.  Neurological: She is alert and oriented to person, place, and time.  Skin:  Skin is warm and dry. No rash noted. She is not diaphoretic.  Psychiatric: Memory, affect and judgment normal.  tearful    Lab Results  Component Value Date   TSH 0.661 02/17/2012   Lab Results  Component Value Date   WBC 4.1 02/17/2012   HGB 13.0 02/17/2012   HCT 36.7 02/17/2012   MCV 72.4* 02/17/2012   PLT 225 02/17/2012   Lab Results  Component Value Date   CREATININE 0.70 02/17/2012   BUN 12 02/17/2012   NA 142 02/17/2012   K 3.7 02/17/2012   CL 105 02/17/2012   CO2 29 02/17/2012   Lab Results  Component Value Date   ALT 20 02/17/2012   AST 20 02/17/2012   ALKPHOS 74 02/17/2012   BILITOT 0.5 02/17/2012   Lab Results  Component Value Date   CHOL 191 01/19/2010   Lab Results  Component Value Date   HDL 58 01/19/2010   Lab Results  Component Value Date   LDLCALC 124* 01/19/2010   Lab Results  Component Value Date   TRIG 46 01/19/2010   Lab Results  Component Value Date   CHOLHDL 3.3 Ratio 01/19/2010     Assessment & Plan  HYPERTENSION Mildly elevated with acute stress. No changes today  ANXIETY Given a small amount of Alprazolam to use prn for excessive stress. Her husband suffers from bipolar disorder and is decompensating she is worried about the safety of her 2 sons especially her younger son who tends to confront his father. She is advised to make a plan to get out of her current situation if she feels threatened. She agrees.

## 2013-07-04 NOTE — Telephone Encounter (Signed)
OK to send in just 20 of Alprazolam 0.25 mg 1 tab po bid prn anxiety, insomnia, then come back in for follow up in next 2-3 weeks with either of [place].

## 2013-07-07 NOTE — Assessment & Plan Note (Signed)
Mildly elevated with acute stress. No changes today

## 2013-07-07 NOTE — Assessment & Plan Note (Signed)
Given a small amount of Alprazolam to use prn for excessive stress. Her husband suffers from bipolar disorder and is decompensating she is worried about the safety of her 2 sons especially her younger son who tends to confront his father. She is advised to make a plan to get out of her current situation if she feels threatened. She agrees.

## 2013-07-24 ENCOUNTER — Telehealth: Payer: Self-pay | Admitting: *Deleted

## 2013-07-24 MED ORDER — ALPRAZOLAM 0.25 MG PO TABS: 0.2500 mg | ORAL_TABLET | Freq: Every evening | ORAL | Status: DC | PRN

## 2013-07-24 NOTE — Telephone Encounter (Signed)
Rx request phoned to pharmacy/SLS  

## 2013-07-24 NOTE — Telephone Encounter (Signed)
OK to send #20 with no refills.

## 2013-07-24 NOTE — Telephone Encounter (Signed)
Faxed refill request received from pharmacy for Alprazolam Last filled by MD on 08.01.14, #[id number] Last AEX - 07.31.14 [Dr. Blyth], Last OV w/PCP 01.21.14 Please Advise/SLS

## 2013-12-23 ENCOUNTER — Ambulatory Visit (INDEPENDENT_AMBULATORY_CARE_PROVIDER_SITE_OTHER): Payer: BC Managed Care – PPO | Admitting: Family

## 2013-12-23 ENCOUNTER — Encounter: Payer: Self-pay | Admitting: Family

## 2013-12-23 VITALS — BP 100/80 | HR 60 | Temp 98.3°F | Resp 12 | Ht 70.0 in | Wt 166.0 lb

## 2013-12-23 DIAGNOSIS — K5289 Other specified noninfective gastroenteritis and colitis: Secondary | ICD-10-CM

## 2013-12-23 DIAGNOSIS — K529 Noninfective gastroenteritis and colitis, unspecified: Secondary | ICD-10-CM

## 2013-12-23 MED ORDER — NEBIVOLOL HCL 20 MG PO TABS: 20.0000 mg | ORAL_TABLET | Freq: Every day | ORAL | Status: DC

## 2013-12-23 NOTE — Progress Notes (Signed)
   Subjective:    Patient ID: Duane BostonCynthia E Sharpe, female    DOB: July 05, 1961, 53 y.o.   MRN: [id number]  HPI  [name] is a 53 yr old female who presents today with chief complaint of dizziness.  Symptoms started on Friday - worsened on Saturday.  She has not vomited or had nausea. Reports pain is epigastric  And has been as high as 4/10. No pain at present. Report tmax 100.5 yesterday. Reports anorexia. Tolerating liquids and staying hydrated.  Reports son has similar symptoms including sore throat/headache.   Review of Systems See HPI  Past Medical History  Diagnosis Date  . Hypertension   . Depression   . Migraine   . Irregular heart rhythm     unclear what arrhythmia  . Pneumothorax   . Torus palatinus   . Supraventricular tachycardia, paroxysmal     History   Social History  . Marital Status: Married    Spouse Name: N/A    Number of Children: 2  . Years of Education: N/A   Occupational History  . SALES    Social History Main Topics  . Smoking status: Never Smoker   . Smokeless tobacco: Never Used  . Alcohol Use: No  . Drug Use: No  . Sexual Activity: Yes   Other Topics Concern  . Not on file   Social History Narrative  . No narrative on file    Past Surgical History  Procedure Laterality Date  . Cesarean section      x 2  . Abdominal hysterectomy  2005  . Pleural scarification      Family History  Problem Relation Age of Onset  . Depression Mother   . Hypertension Father   . Arthritis Other   . Coronary artery disease Father   . Stroke Other   . Mental illness Other   . Heart failure Brother   . Hypertension Maternal Aunt     No Known Allergies  Current Outpatient Prescriptions on File Prior to Visit  Medication Sig Dispense Refill  . loratadine (CLARITIN) 10 MG tablet Take 1 tablet (10 mg total) by mouth as needed.  30 tablet  2  . Nebivolol HCl (BYSTOLIC) 20 MG TABS Take 1 tablet (20 mg total) by mouth daily. Office visit is required for  refills  30 tablet  2   No current facility-administered medications on file prior to visit.    BP 100/80  Pulse 60  Temp(Src) 98.3 F (36.8 C) (Oral)  Resp 12  Ht 5\' 10"  (1.778 m)  Wt 166 lb (75.297 kg)  BMI 23.82 kg/m2  SpO2 96%       Objective:   Physical Exam  Constitutional: She is oriented to person, place, and time. She appears well-developed and well-nourished. No distress.  Cardiovascular: Normal rate and regular rhythm.   No murmur heard. Pulmonary/Chest: Effort normal and breath sounds normal. No respiratory distress. She has no wheezes. She has no rales. She exhibits no tenderness.  Abdominal: Soft. Bowel sounds are normal. She exhibits no distension and no mass. There is no tenderness. There is no rebound and no guarding.  Neurological: She is alert and oriented to person, place, and time.  Psychiatric: She has a normal mood and affect. Her behavior is normal. Judgment and thought content normal.          Assessment & Plan:

## 2013-12-23 NOTE — Patient Instructions (Signed)
Viral Gastroenteritis Viral gastroenteritis is also known as stomach flu. This condition affects the stomach and intestinal tract. It can cause sudden diarrhea and vomiting. The illness typically lasts 3 to 8 days. Most people develop an immune response that eventually gets rid of the virus. While this natural response develops, the virus can make you quite ill. CAUSES  Many different viruses can cause gastroenteritis, such as rotavirus or noroviruses. You can catch one of these viruses by consuming contaminated food or water. You may also catch a virus by sharing utensils or other personal items with an infected person or by touching a contaminated surface. SYMPTOMS  The most common symptoms are diarrhea and vomiting. These problems can cause a severe loss of body fluids (dehydration) and a body salt (electrolyte) imbalance. Other symptoms may include:  Fever.  Headache.  Fatigue.  Abdominal pain. DIAGNOSIS  Your caregiver can usually diagnose viral gastroenteritis based on your symptoms and a physical exam. A stool sample may also be taken to test for the presence of viruses or other infections. TREATMENT  This illness typically goes away on its own. Treatments are aimed at rehydration. The most serious cases of viral gastroenteritis involve vomiting so severely that you are not able to keep fluids down. In these cases, fluids must be given through an intravenous line (IV). HOME CARE INSTRUCTIONS   Drink enough fluids to keep your urine clear or pale yellow. Drink small amounts of fluids frequently and increase the amounts as tolerated.  Ask your caregiver for specific rehydration instructions.  Avoid:  Foods high in sugar.  Alcohol.  Carbonated drinks.  Tobacco.  Juice.  Caffeine drinks.  Extremely hot or cold fluids.  Fatty, greasy foods.  Too much intake of anything at one time.  Dairy products until 24 to 48 hours after diarrhea stops.  You may consume probiotics.  Probiotics are active cultures of beneficial bacteria. They may lessen the amount and number of diarrheal stools in adults. Probiotics can be found in yogurt with active cultures and in supplements.  Wash your hands well to avoid spreading the virus.  Only take over-the-counter or prescription medicines for pain, discomfort, or fever as directed by your caregiver. Do not give aspirin to children. Antidiarrheal medicines are not recommended.  Ask your caregiver if you should continue to take your regular prescribed and over-the-counter medicines.  Keep all follow-up appointments as directed by your caregiver. SEEK IMMEDIATE MEDICAL CARE IF:   You are unable to keep fluids down.  You do not urinate at least once every 6 to 8 hours.  You develop shortness of breath.  You notice blood in your stool or vomit. This may look like coffee grounds.  You have abdominal pain that increases or is concentrated in one small area (localized).  You have persistent vomiting or diarrhea.  You have a fever.  The patient is a child younger than 3 months, and he or she has a fever.  The patient is a child older than 3 months, and he or she has a fever and persistent symptoms.  The patient is a child older than 3 months, and he or she has a fever and symptoms suddenly get worse.  The patient is a baby, and he or she has no tears when crying. MAKE SURE YOU:   Understand these instructions.  Will watch your condition.  Will get help right away if you are not doing well or get worse. Document Released: 11/20/2005 Document Revised: 02/12/2012 Document Reviewed: 09/06/2011   [employer] Patient Information 2014 [employer]  

## 2013-12-23 NOTE — Assessment & Plan Note (Addendum)
Symptoms consistent with resolving viral gastroenteritis. Advised supportive measures, follow up if symptoms worsening, or if symptoms do not continue to improve.

## 2014-01-30 ENCOUNTER — Other Ambulatory Visit: Payer: Self-pay | Admitting: Family

## 2014-01-30 NOTE — Telephone Encounter (Signed)
90 day supply bystolic sent to pharmacy. Pt is due for follow up before this runs out.  Please call pt to arrange appt

## 2014-02-02 NOTE — Telephone Encounter (Signed)
Letter mailed to pt.  

## 2014-02-02 NOTE — Telephone Encounter (Signed)
Phone not in service.

## 2014-02-07 ENCOUNTER — Emergency Department (HOSPITAL_BASED_OUTPATIENT_CLINIC_OR_DEPARTMENT_OTHER)
Admission: EM | Admit: 2014-02-07 | Discharge: 2014-02-07 | Disposition: A | Discharge status: Home / Self Care | Payer: BC Managed Care – PPO | Attending: Emergency Medicine | Admitting: Emergency Medicine

## 2014-02-07 ENCOUNTER — Emergency Department (HOSPITAL_BASED_OUTPATIENT_CLINIC_OR_DEPARTMENT_OTHER): Payer: BC Managed Care – PPO

## 2014-02-07 ENCOUNTER — Encounter (HOSPITAL_BASED_OUTPATIENT_CLINIC_OR_DEPARTMENT_OTHER): Payer: Self-pay | Admitting: Emergency Medicine

## 2014-02-07 ENCOUNTER — Telehealth: Payer: Self-pay | Admitting: Family

## 2014-02-07 DIAGNOSIS — Z79899 Other long term (current) drug therapy: Secondary | ICD-10-CM | POA: Insufficient documentation

## 2014-02-07 DIAGNOSIS — I10 Essential (primary) hypertension: Secondary | ICD-10-CM | POA: Insufficient documentation

## 2014-02-07 DIAGNOSIS — Z8709 Personal history of other diseases of the respiratory system: Secondary | ICD-10-CM | POA: Insufficient documentation

## 2014-02-07 DIAGNOSIS — Z8719 Personal history of other diseases of the digestive system: Secondary | ICD-10-CM | POA: Insufficient documentation

## 2014-02-07 DIAGNOSIS — R197 Diarrhea, unspecified: Secondary | ICD-10-CM | POA: Insufficient documentation

## 2014-02-07 DIAGNOSIS — R110 Nausea: Secondary | ICD-10-CM | POA: Insufficient documentation

## 2014-02-07 DIAGNOSIS — R1013 Epigastric pain: Secondary | ICD-10-CM | POA: Insufficient documentation

## 2014-02-07 DIAGNOSIS — R630 Anorexia: Secondary | ICD-10-CM | POA: Insufficient documentation

## 2014-02-07 DIAGNOSIS — Z8659 Personal history of other mental and behavioral disorders: Secondary | ICD-10-CM | POA: Insufficient documentation

## 2014-02-07 LAB — COMPREHENSIVE METABOLIC PANEL
ALBUMIN: 4.2 g/dL (ref 3.5–5.2)
ALK PHOS: 75 U/L (ref 39–117)
ALT: 15 U/L (ref 0–35)
AST: 18 U/L (ref 0–37)
BILIRUBIN TOTAL: 0.4 mg/dL (ref 0.3–1.2)
BUN: 14 mg/dL (ref 6–23)
CHLORIDE: 105 meq/L (ref 96–112)
CO2: 26 mEq/L (ref 19–32)
Calcium: 9.8 mg/dL (ref 8.4–10.5)
Creatinine, Ser: 0.7 mg/dL (ref 0.50–1.10)
GFR calc Af Amer: >90 mL/min (ref >90)
GFR calc non Af Amer: >90 mL/min (ref >90)
Glucose, Bld: 101 mg/dL — ABNORMAL HIGH (ref 70–99)
POTASSIUM: 4.3 meq/L (ref 3.7–5.3)
SODIUM: 143 meq/L (ref 137–147)
TOTAL PROTEIN: 7.3 g/dL (ref 6.0–8.3)

## 2014-02-07 LAB — CBC WITH DIFFERENTIAL/PLATELET
BASOS ABS: 0 10*3/uL (ref 0.0–0.1)
BASOS PCT: 0 % (ref 0–1)
Eosinophils Absolute: 0.1 10*3/uL (ref 0.0–0.7)
Eosinophils Relative: 2 % (ref 0–5)
HEMATOCRIT: 36.3 % (ref 36.0–46.0)
HEMOGLOBIN: 12.7 g/dL (ref 12.0–15.0)
LYMPHS PCT: 36 % (ref 12–46)
Lymphs Abs: 2.1 10*3/uL (ref 0.7–4.0)
MCH: 25.7 pg — ABNORMAL LOW (ref 26.0–34.0)
MCHC: 35 g/dL (ref 30.0–36.0)
MCV: 73.5 fL — ABNORMAL LOW (ref 78.0–100.0)
MONO ABS: 0.5 10*3/uL (ref 0.1–1.0)
MONOS PCT: 8 % (ref 3–12)
NEUTROS ABS: 3.3 10*3/uL (ref 1.7–7.7)
NEUTROS PCT: 55 % (ref 43–77)
Platelets: 254 10*3/uL (ref 150–400)
RBC: 4.94 MIL/uL (ref 3.87–5.11)
RDW: 14.9 % (ref 11.5–15.5)
WBC: 6 10*3/uL (ref 4.0–10.5)

## 2014-02-07 LAB — URINALYSIS, ROUTINE W REFLEX MICROSCOPIC
Bilirubin Urine: NEGATIVE
GLUCOSE, UA: NEGATIVE mg/dL
Ketones, ur: NEGATIVE mg/dL
LEUKOCYTES UA: NEGATIVE
NITRITE: NEGATIVE
PH: 6.5 (ref 5.0–8.0)
Protein, ur: NEGATIVE mg/dL
SPECIFIC GRAVITY, URINE: 1.003 — AB (ref 1.005–1.030)
Urobilinogen, UA: 0.2 mg/dL (ref 0.0–1.0)

## 2014-02-07 LAB — LIPASE, BLOOD: Lipase: 58 U/L (ref 11–59)

## 2014-02-07 LAB — URINE MICROSCOPIC-ADD ON

## 2014-02-07 LAB — TROPONIN I: Troponin I: <0.3 ng/mL (ref <0.30)

## 2014-02-07 MED ORDER — SODIUM CHLORIDE 0.9 % IV BOLUS (SEPSIS)
1000.0000 mL | Freq: Once | INTRAVENOUS | Status: AC
  Administered 2014-02-07: 1000 mL via INTRAVENOUS

## 2014-02-07 MED ORDER — ONDANSETRON HCL 4 MG/2ML IJ SOLN
4.0000 mg | Freq: Once | INTRAMUSCULAR | Status: AC
  Administered 2014-02-07: 4 mg via INTRAVENOUS
  Filled 2014-02-07: qty 2

## 2014-02-07 MED ORDER — PANTOPRAZOLE SODIUM 40 MG PO TBEC: 40.0000 mg | DELAYED_RELEASE_TABLET | Freq: Every day | ORAL | Status: DC

## 2014-02-07 MED ORDER — ONDANSETRON HCL 4 MG PO TABS: 4.0000 mg | ORAL_TABLET | Freq: Four times a day (QID) | ORAL | Status: DC

## 2014-02-07 NOTE — Telephone Encounter (Signed)
Please call pt and arrange a 1-2 week ED follow up.

## 2014-02-07 NOTE — Discharge Instructions (Signed)
Abdominal Pain, Women °Abdominal (stomach, pelvic, or belly) pain can be caused by many things. It is important to tell your doctor: °· The location of the pain. °· Does it come and go or is it present all the time? °· Are there things that start the pain (eating certain foods, exercise)? °· Are there other symptoms associated with the pain (fever, nausea, vomiting, diarrhea)? °All of this is helpful to know when trying to find the cause of the pain. °CAUSES  °· Stomach: virus or bacteria infection, or ulcer. °· Intestine: appendicitis (inflamed appendix), regional ileitis (Crohn's disease), ulcerative colitis (inflamed colon), irritable bowel syndrome, diverticulitis (inflamed diverticulum of the colon), or cancer of the stomach or intestine. °· Gallbladder disease or stones in the gallbladder. °· Kidney disease, kidney stones, or infection. °· Pancreas infection or cancer. °· Fibromyalgia (pain disorder). °· Diseases of the female organs: °· Uterus: fibroid (non-cancerous) tumors or infection. °· Fallopian tubes: infection or tubal pregnancy. °· Ovary: cysts or tumors. °· Pelvic adhesions (scar tissue). °· Endometriosis (uterus lining tissue growing in the pelvis and on the pelvic organs). °· Pelvic congestion syndrome (female organs filling up with blood just before the menstrual period). °· Pain with the menstrual period. °· Pain with ovulation (producing an egg). °· Pain with an IUD (intrauterine device, birth control) in the uterus. °· Cancer of the female organs. °· Functional pain (pain not caused by a disease, may improve without treatment). °· Psychological pain. °· Depression. °DIAGNOSIS  °Your doctor will decide the seriousness of your pain by doing an examination. °· Blood tests. °· X-rays. °· Ultrasound. °· CT scan (computed tomography, special type of X-ray). °· MRI (magnetic resonance imaging). °· Cultures, for infection. °· Barium enema (dye inserted in the large intestine, to better view it with  X-rays). °· Colonoscopy (looking in intestine with a lighted tube). °· Laparoscopy (minor surgery, looking in abdomen with a lighted tube). °· Major abdominal exploratory surgery (looking in abdomen with a large incision). °TREATMENT  °The treatment will depend on the cause of the pain.  °· Many cases can be observed and treated at home. °· Over-the-counter medicines recommended by your caregiver. °· Prescription medicine. °· Antibiotics, for infection. °· Birth control pills, for painful periods or for ovulation pain. °· Hormone treatment, for endometriosis. °· Nerve blocking injections. °· Physical therapy. °· Antidepressants. °· Counseling with a psychologist or psychiatrist. °· Minor or major surgery. °HOME CARE INSTRUCTIONS  °· Do not take laxatives, unless directed by your caregiver. °· Take over-the-counter pain medicine only if ordered by your caregiver. Do not take aspirin because it can cause an upset stomach or bleeding. °· Try a clear liquid diet (broth or water) as ordered by your caregiver. Slowly move to a bland diet, as tolerated, if the pain is related to the stomach or intestine. °· Have a thermometer and take your temperature several times a day, and record it. °· Bed rest and sleep, if it helps the pain. °· Avoid sexual intercourse, if it causes pain. °· Avoid stressful situations. °· Keep your follow-up appointments and tests, as your caregiver orders. °· If the pain does not go away with medicine or surgery, you may try: °· Acupuncture. °· Relaxation exercises (yoga, meditation). °· Group therapy. °· Counseling. °SEEK MEDICAL CARE IF:  °· You notice certain foods cause stomach pain. °· Your home care treatment is not helping your pain. °· You need stronger pain medicine. °· You want your IUD removed. °· You feel faint or   lightheaded. °· You develop nausea and vomiting. °· You develop a rash. °· You are having side effects or an allergy to your medicine. °SEEK IMMEDIATE MEDICAL CARE IF:  °· Your  pain does not go away or gets worse. °· You have a fever. °· Your pain is felt only in portions of the abdomen. The right side could possibly be appendicitis. The left lower portion of the abdomen could be colitis or diverticulitis. °· You are passing blood in your stools (bright red or black tarry stools, with or without vomiting). °· You have blood in your urine. °· You develop chills, with or without a fever. °· You pass out. °MAKE SURE YOU:  °· Understand these instructions. °· Will watch your condition. °· Will get help right away if you are not doing well or get worse. °Document Released: 09/17/2007 Document Revised: 02/12/2012 Document Reviewed: 10/07/2009 °ExitCare® Patient Information ©2014 [employer]. °Diarrhea °Diarrhea is frequent loose and watery bowel movements. It can cause you to feel weak and dehydrated. Dehydration can cause you to become tired and thirsty, have a dry mouth, and have decreased urination that often is dark yellow. Diarrhea is a sign of another problem, most often an infection that will not last long. In most cases, diarrhea typically lasts 2 3 days. However, it can last longer if it is a sign of something more serious. It is important to treat your diarrhea as directed by your caregive to lessen or prevent future episodes of diarrhea. °CAUSES  °Some common causes include: °· Gastrointestinal infections caused by viruses, bacteria, or parasites. °· Food poisoning or food allergies. °· Certain medicines, such as antibiotics, chemotherapy, and laxatives. °· Artificial sweeteners and fructose. °· Digestive disorders. °HOME CARE INSTRUCTIONS °· Ensure adequate fluid intake (hydration): have 1 cup (8 oz) of fluid for each diarrhea episode. Avoid fluids that contain simple sugars or sports drinks, fruit juices, whole milk products, and sodas. Your urine should be clear or pale yellow if you are drinking enough fluids. Hydrate with an oral rehydration solution that you can purchase at  pharmacies, retail stores, and online. You can prepare an oral rehydration solution at home by mixing the following ingredients together: °·   tsp table salt. °· ¾ tsp baking soda. °·  tsp salt substitute containing potassium chloride. °· 1  tablespoons sugar. °· 1 L (34 oz) of water. °· Certain foods and beverages may increase the speed at which food moves through the gastrointestinal (GI) tract. These foods and beverages should be avoided and include: °· Caffeinated and alcoholic beverages. °· High-fiber foods, such as raw fruits and vegetables, nuts, seeds, and whole grain breads and cereals. °· Foods and beverages sweetened with sugar alcohols, such as xylitol, sorbitol, and mannitol. °· Some foods may be well tolerated and may help thicken stool including: °· Starchy foods, such as rice, toast, pasta, low-sugar cereal, oatmeal, grits, baked potatoes, crackers, and bagels. °· Bananas. °· Applesauce. °· Add probiotic-rich foods to help increase healthy bacteria in the GI tract, such as yogurt and fermented milk products. °· Wash your hands well after each diarrhea episode. °· Only take over-the-counter or prescription medicines as directed by your caregiver. °· Take a warm bath to relieve any burning or pain from frequent diarrhea episodes. °SEEK IMMEDIATE MEDICAL CARE IF:  °· You are unable to keep fluids down. °· You have persistent vomiting. °· You have blood in your stool, or your stools are black and tarry. °· You do not urinate in 6   8 hours, or there is only a small amount of very dark urine. °· You have abdominal pain that increases or localizes. °· You have weakness, dizziness, confusion, or lightheadedness. °· You have a severe headache. °· Your diarrhea gets worse or does not get better. °· You have a fever or persistent symptoms for more than 2 3 days. °· You have a fever and your symptoms suddenly get worse. °MAKE SURE YOU:  °· Understand these instructions. °· Will watch your condition. °· Will get  help right away if you are not doing well or get worse. °Document Released: 11/10/2002 Document Revised: 11/06/2012 Document Reviewed: 07/28/2012 °ExitCare® Patient Information ©2014 [employer]. ° °

## 2014-02-07 NOTE — ED Provider Notes (Signed)
CSN: [id number]     Arrival date & time 02/07/14  1246 History   First MD Initiated Contact with Patient 02/07/14 1310     Chief Complaint  Patient presents with  . Abdominal Pain  . Diarrhea  . Nausea     (Consider location/radiation/quality/duration/timing/severity/associated sxs/prior Treatment) Patient is a 53 y.o. female presenting with abdominal pain and diarrhea. The history is provided by the patient. No language interpreter was used.  Abdominal Pain Pain location:  Epigastric Pain quality: aching   Pain radiates to:  Chest Pain severity:  Moderate Onset quality:  Gradual Duration:  3 weeks Timing:  Intermittent Progression:  Waxing and waning Chronicity:  New Context: eating and previous surgery   Context: not recent illness, not sick contacts, not suspicious food intake and not trauma   Relieved by:  Nothing Worsened by:  Eating Ineffective treatments:  Antacids Associated symptoms: anorexia, diarrhea and nausea   Associated symptoms: no chest pain, no chills, no cough, no dysuria, no fatigue, no fever, no shortness of breath, no sore throat and no vomiting   Diarrhea:    Quality:  Semi-solid   Number of occurrences:  3-4 a day for about 3 weeks    Severity:  Moderate   Duration:  3 weeks   Timing:  Intermittent   Progression:  Unchanged Risk factors: no alcohol abuse, has not had multiple surgeries, no NSAID use, not obese and not pregnant   Diarrhea Associated symptoms: abdominal pain   Associated symptoms: no arthralgias, no chills, no diaphoresis, no fever, no headaches and no vomiting     Past Medical History  Diagnosis Date  . Hypertension   . Depression   . Migraine   . Irregular heart rhythm     unclear what arrhythmia  . Pneumothorax   . Torus palatinus   . Supraventricular tachycardia, paroxysmal    Past Surgical History  Procedure Laterality Date  . Cesarean section      x 2  . Abdominal hysterectomy  2005  . Pleural scarification      Family History  Problem Relation Age of Onset  . Depression Mother   . Hypertension Father   . Arthritis Other   . Coronary artery disease Father   . Stroke Other   . Mental illness Other   . Heart failure Brother   . Hypertension Maternal Aunt    History  Substance Use Topics  . Smoking status: Never Smoker   . Smokeless tobacco: Never Used  . Alcohol Use: No   OB History   Grav Para Term Preterm Abortions TAB SAB Ect Mult Living   2 2             Review of Systems  Constitutional: Negative for fever, chills, diaphoresis, activity change, appetite change and fatigue.  HENT: Negative for congestion, facial swelling, rhinorrhea and sore throat.   Eyes: Negative for photophobia and discharge.  Respiratory: Negative for cough, chest tightness and shortness of breath.   Cardiovascular: Negative for chest pain, palpitations and leg swelling.  Gastrointestinal: Positive for nausea, abdominal pain, diarrhea and anorexia. Negative for vomiting.  Endocrine: Negative for polydipsia and polyuria.  Genitourinary: Negative for dysuria, frequency, difficulty urinating and pelvic pain.  Musculoskeletal: Negative for arthralgias, back pain, neck pain and neck stiffness.  Skin: Negative for color change and wound.  Allergic/Immunologic: Negative for immunocompromised state.  Neurological: Negative for facial asymmetry, weakness, numbness and headaches.  Hematological: Does not bruise/bleed easily.  Psychiatric/Behavioral: Negative for confusion and  agitation.      Allergies  Review of patient's allergies indicates no known allergies.  Home Medications   Current Outpatient Rx  Name  Route  Sig  Dispense  Refill  . BYSTOLIC 10 MG tablet      TAKE 2 TABLETS BY MOUTH EVERY DAY   180 tablet   0     **Patient requests 90 days supply**   . loratadine (CLARITIN) 10 MG tablet   Oral   Take 1 tablet (10 mg total) by mouth as needed.   30 tablet   2   . ondansetron (ZOFRAN) 4 MG  tablet   Oral   Take 1 tablet (4 mg total) by mouth every 6 (six) hours.   12 tablet   0   . pantoprazole (PROTONIX) 40 MG tablet   Oral   Take 1 tablet (40 mg total) by mouth daily.   30 tablet   0    BP 121/85  Pulse 53  Temp(Src) 98.5 F (36.9 C) (Oral)  Resp 18  SpO2 100% Physical Exam  Constitutional: She is oriented to person, place, and time. She appears well-developed and well-nourished. No distress.  HENT:  Head: Normocephalic and atraumatic.  Mouth/Throat: No oropharyngeal exudate.  Eyes: Pupils are equal, round, and reactive to light.  Neck: Normal range of motion. Neck supple.  Cardiovascular: Normal rate, regular rhythm and normal heart sounds.  Exam reveals no gallop and no friction rub.   No murmur heard. Pulmonary/Chest: Effort normal and breath sounds normal. No respiratory distress. She has no wheezes. She has no rales.  Abdominal: Soft. Bowel sounds are normal. She exhibits no distension and no mass. There is tenderness in the epigastric area. There is no rigidity, no rebound and no guarding.  Musculoskeletal: Normal range of motion. She exhibits no edema and no tenderness.  Neurological: She is alert and oriented to person, place, and time.  Skin: Skin is warm and dry.  Psychiatric: She has a normal mood and affect.    ED Course  Procedures (including critical care time) Labs Review Labs Reviewed  URINALYSIS, ROUTINE W REFLEX MICROSCOPIC - Abnormal; Notable for the following:    Specific Gravity, Urine 1.003 (*)    Hgb urine dipstick SMALL (*)    All other components within normal limits  CBC WITH DIFFERENTIAL - Abnormal; Notable for the following:    MCV 73.5 (*)    MCH 25.7 (*)    All other components within normal limits  COMPREHENSIVE METABOLIC PANEL - Abnormal; Notable for the following:    Glucose, Bld 101 (*)    All other components within normal limits  CLOSTRIDIUM DIFFICILE BY PCR  LIPASE, BLOOD  TROPONIN I  URINE MICROSCOPIC-ADD ON    Imaging Review [place] Abdomen Complete  02/07/2014   CLINICAL DATA:  Epigastric pain and nausea.  EXAM: ULTRASOUND ABDOMEN COMPLETE  COMPARISON:  CT, 01/24/2008  FINDINGS: Gallbladder:  No gallstones or wall thickening visualized. No sonographic Murphy sign noted.  Common bile duct:  Diameter: 4 mm  Liver:  No focal lesion identified. Within normal limits in parenchymal echogenicity.  IVC:  No abnormality visualized.  Pancreas:  Visualized portion unremarkable.  Spleen:  Size and appearance within normal limits.  Right Kidney:  Length: 12.4 cm. Echogenicity within normal limits. No mass or hydronephrosis visualized.  Left Kidney:  Length: 11.2 cm. 2 cm lower pole cyst. No other masses. No stones. No hydronephrosis.  Abdominal aorta:  No aneurysm visualized.  Other findings:  None.  IMPRESSION: 1. No acute findings.  Normal gallbladder.  No bile duct dilation. 2. Left renal cyst.  No other abnormalities.   Electronically Signed   By: Amie Portland M.D.   On: 02/07/2014 14:25     EKG Interpretation   Date/Time:  Saturday February 07 2014 13:22:46 EST Ventricular Rate:  48 PR Interval:  184 QRS Duration: 112 QT Interval:  480 QTC Calculation: 428 R Axis:   -27 Text Interpretation:  Sinus bradycardia Incomplete right bundle branch  block Moderate voltage criteria for LVH, may be normal variant Cannot rule  out Septal infarct , age undetermined T wave abnormality, consider  inferolateral ischemia Abnormal ECG No significant change was found  Confirmed by Jaelie Aguilera  MD, [employer] [facility] [phone]) on 02/07/2014 1:30:38 PM      MDM   Final diagnoses:  Epigastric pain  Diarrhea    Pt is a 53 y.o. female with Pmhx as above who presents with about 3 weeks of daily epigastric pain, worse after eating with 3-4 daily episodes of d/a, nausea, intermittent low grade temp, heartburn. Denies vomiting, lower abdominal pain, SOB, urinary symptoms. She states she has had generalized weakness, fatigue, today had an episode of  palpitations and lightheadedness.  On PE, VSS, pt in NAD.  +epigastric ttp w/o rebound or guarding. Negative murphy's.  Cardiopulm exam benign. EKG unchanged from prior. W/u shows nml WBC,  Hb, lipase, Cr, LFTs, and trop. Urine not infected. Orthostatics negative.  RUQ US done to r/o cholecystitis and was also negative for GB pathology.  Given unchanged EKG, neg trop, and fact symptoms are assoc w/ d/a, I feel a cardiac cause of symptoms would be unlikely.  Possibilities include IBS, gastritis, continued gastroenteritis, infectious d/a. Have asker her to follow closely for continued w/u with PCP as there does not appear to be an acute emergent or surgical cause of symptoms today. Have discussed taking stool sample to PCP to r/o Cl diff.  Will do trial of protonix and zofran. Return precautions given for new or worsening symptoms including worsening pain, fever, inability to tolerate PO.          Shanna Cisco, MD 02/07/14 1524

## 2014-02-07 NOTE — ED Notes (Signed)
Patient here with 3 weeks of general abdominal pain and nausea with diarrhea, reports general weakness of for same. Has seen her MD and no medications administered

## 2014-02-09 NOTE — Telephone Encounter (Signed)
Spoke to patient about scheduling an appointment and she states that she is at work and will have to call back to schedule.

## 2014-02-11 LAB — CLOSTRIDIUM DIFFICILE BY PCR: Toxigenic C. Difficile by PCR: NEGATIVE

## 2014-05-01 ENCOUNTER — Encounter: Payer: Self-pay | Admitting: Family

## 2014-05-01 ENCOUNTER — Ambulatory Visit (INDEPENDENT_AMBULATORY_CARE_PROVIDER_SITE_OTHER): Payer: BC Managed Care – PPO | Admitting: Family

## 2014-05-01 ENCOUNTER — Telehealth: Payer: Self-pay | Admitting: *Deleted

## 2014-05-01 VITALS — BP 126/80 | HR 60 | Temp 98.4°F | Resp 16 | Ht 70.0 in | Wt 162.1 lb

## 2014-05-01 DIAGNOSIS — R109 Unspecified abdominal pain: Secondary | ICD-10-CM

## 2014-05-01 DIAGNOSIS — N390 Urinary tract infection, site not specified: Secondary | ICD-10-CM

## 2014-05-01 DIAGNOSIS — N200 Calculus of kidney: Secondary | ICD-10-CM

## 2014-05-01 LAB — POCT URINALYSIS DIPSTICK
Bilirubin, UA: NEGATIVE
GLUCOSE UA: NEGATIVE
Ketones, UA: NEGATIVE
NITRITE UA: NEGATIVE
PROTEIN UA: NEGATIVE
Spec Grav, UA: <=1.005
UROBILINOGEN UA: 0.2
pH, UA: 7.5

## 2014-05-01 MED ORDER — CIPROFLOXACIN HCL 500 MG PO TABS: 500.0000 mg | ORAL_TABLET | Freq: Two times a day (BID) | ORAL | Status: DC

## 2014-05-01 NOTE — Telephone Encounter (Signed)
Notified pt that urology referral is not being made and she voices understanding.

## 2014-05-01 NOTE — Telephone Encounter (Signed)
My apologies.  I am not referring her to urology. That is an error.

## 2014-05-01 NOTE — Telephone Encounter (Signed)
Notified pt. She states she wasn't aware of referral to urology but is willing to Proceed. I did not see referral in EPIC.  Please advise.

## 2014-05-01 NOTE — Progress Notes (Signed)
Pre visit review using our clinic review tool, if applicable. No additional management support is needed unless otherwise documented below in the visit note. 

## 2014-05-01 NOTE — Telephone Encounter (Addendum)
CT notes cyst left kidney.  No stones noted. I recommend cipro rx as we discussed at her visit and referral to urology.

## 2014-05-01 NOTE — Patient Instructions (Signed)
Start cipro. Complete CT scan- we will contact you with your results.  Please call if symptoms worsen, or if symptoms do not improve. Follow up in 1 week.

## 2014-05-01 NOTE — Assessment & Plan Note (Signed)
UA notes mod blood and trace leukocytes.  UTI/Pyelo/Nephrolithiasis remain in differential.  Will rx with cipro, and obtain CT abd/pelvis with kidney stone protocol to further evaluate.    If work up negative and symptoms persist, consider further evaluation of gallbladder.  Pt is instructed to call if symptoms worsen, or if symptoms do not improve. Follow up in 1 week.

## 2014-05-01 NOTE — Telephone Encounter (Signed)
Received message from pt requesting CT result. Result received from [facility] and forwarded to Provider.  Please advise.

## 2014-05-01 NOTE — Progress Notes (Signed)
Subjective:    Patient ID: [name], female    DOB: 1961-04-16, 53 y.o.   MRN: [id number]  HPI   [name] presents today complaining of pain in right flank which has lasted two weeks.It radiates at times to the front and the back. Seems to flare when she eats. Pain dull and 6/10 at its worst. Occasionally goes away completely but she has the pain most of the time and it varies in intensity. C/O constipation in the last week and frequency has reduced from once daily to occasionally skipping a day. Also c/o fatigue. No hx of kidney stones but states has been told she has a cyst on her kidney when she presented to ED with stomach pain about a month ago and had an abdominal ultrasound. She reports that she has also had some urinary urgency/frequency.     Review of Systems  Constitutional: Positive for appetite change. Negative for activity change.       Has had decreased appetite for past 6 months. Has lost 10-15 lbs over last 6 months without trying.  HENT:       No dysphagia. Food does not hang up when swallowing.  Respiratory: Negative for cough and choking.   Cardiovascular: Negative for chest pain.  Gastrointestinal: Positive for constipation and abdominal distention. Negative for nausea, vomiting and blood in stool.  Genitourinary: Positive for flank pain. Negative for difficulty urinating.   Denies fevers, chills, sweats. She has heartburn/reflux quite often for which she takes Tums with some relief.  Denies blood in stool. Has not had a gynecological visit in last few years.  Abdominal US 02/07/2014 IMPRESSION:  1. No acute findings. Normal gallbladder. No bile duct dilation.  2. Left renal cyst. No other abnormalities.  Electronically Signed  By: Amie Portlandavid Ormond M.D.  On: 02/07/2014 14:25  Past Medical History  Diagnosis Date  . Hypertension   . Depression   . Migraine   . Irregular heart rhythm     unclear what arrhythmia  . Pneumothorax   . Torus palatinus   .  Supraventricular tachycardia, paroxysmal     History   Social History  . Marital Status: Married    Spouse Name: N/A    Number of Children: 2  . Years of Education: N/A   Occupational History  . SALES    Social History Main Topics  . Smoking status: Never Smoker   . Smokeless tobacco: Never Used  . Alcohol Use: No  . Drug Use: No  . Sexual Activity: Yes   Other Topics Concern  . Not on file   Social History Narrative  . No narrative on file    Past Surgical History  Procedure Laterality Date  . Cesarean section      x 2  . Abdominal hysterectomy  2005  . Pleural scarification      Family History  Problem Relation Age of Onset  . Depression Mother   . Hypertension Father   . Arthritis Other   . Coronary artery disease Father   . Stroke Other   . Mental illness Other   . Heart failure Brother   . Hypertension Maternal Aunt     No Known Allergies  Current Outpatient Prescriptions on File Prior to Visit  Medication Sig Dispense Refill  . BYSTOLIC 10 MG tablet TAKE 2 TABLETS BY MOUTH EVERY DAY  180 tablet  0  . loratadine (CLARITIN) 10 MG tablet Take 1 tablet (10 mg total) by mouth as needed.  30 tablet  2   No current facility-administered medications on file prior to visit.    BP 126/80  Pulse 60  Temp(Src) 98.4 F (36.9 C) (Oral)  Resp 16  Ht 5\' 10"  (1.778 m)  Wt 162 lb 1.3 oz (73.519 kg)  BMI 23.26 kg/m2  SpO2 99%     Objective:   Physical Exam  Constitutional: She is oriented to person, place, and time. She appears well-developed and well-nourished.  HENT:  Head: Normocephalic and atraumatic.  Eyes: No scleral icterus.  Neck: Neck supple. No thyromegaly present.  Cardiovascular: Normal rate, regular rhythm and normal heart sounds.   No murmur heard. Pulmonary/Chest: Effort normal and breath sounds normal. No respiratory distress. She has no wheezes. She has no rales.  Abdominal: Soft. Bowel sounds are normal. She exhibits no distension  and no mass. There is no tenderness. There is negative Murphy's sign.  Genitourinary:  Neg CVAT bilaterally  Lymphadenopathy:    She has no cervical adenopathy.  Neurological: She is alert and oriented to person, place, and time.  Skin: Skin is warm and dry.  Psychiatric: She has a normal mood and affect. Her behavior is normal. Judgment and thought content normal.          Assessment & Plan:  Patient seen by [facility] NP-student.  I have personally seen and examined patient and agree with Ms. Whitmire's assessment and plan.

## 2014-05-03 LAB — URINE CULTURE
COLONY COUNT: NO GROWTH
ORGANISM ID, BACTERIA: NO GROWTH

## 2014-05-04 ENCOUNTER — Telehealth: Payer: Self-pay | Admitting: *Deleted

## 2014-05-04 NOTE — Telephone Encounter (Signed)
Message copied by Kathi Simpers on Mon May 04, 2014  6:49 PM ------      Message from: O'SULLIVAN, MELISSA      Created: Mon May 04, 2014 12:11 PM       Please notify pt that urine culture is negative. She can stop cipro.  How is her flank pain?  If not improved, then I will order a HIDA scan to further evaluate her gallbladder. ------

## 2014-05-04 NOTE — Telephone Encounter (Signed)
Notified pt. She states the flank pain is some better but still occurs periodically. Also notes some burning with urination this morning.  Please advise.

## 2014-05-05 MED ORDER — NEBIVOLOL HCL 10 MG PO TABS: ORAL_TABLET | ORAL | Status: DC

## 2014-05-05 NOTE — Telephone Encounter (Signed)
Pt also left message requesting samples of blood pressure medication as she is unable to afford it at this time. Notified pt of below recommendation and that samples have been placed at front desk for pick up.

## 2014-05-05 NOTE — Telephone Encounter (Signed)
Let me know if symptoms worsen or do not improve and I will refer her to urology.

## 2014-05-11 ENCOUNTER — Encounter: Payer: Self-pay | Admitting: Family Medicine

## 2014-07-13 ENCOUNTER — Other Ambulatory Visit: Payer: Self-pay | Admitting: Family

## 2014-07-14 NOTE — Telephone Encounter (Signed)
Please schedule pt a follow up in September.

## 2014-07-15 NOTE — Telephone Encounter (Signed)
Left message for patient to return my call.

## 2014-07-16 NOTE — Telephone Encounter (Signed)
Left message for patient to return my call.

## 2014-07-17 NOTE — Telephone Encounter (Signed)
Left message for patient to return my call.

## 2014-07-17 NOTE — Telephone Encounter (Signed)
Letter mailed to pt.  

## 2014-07-24 ENCOUNTER — Ambulatory Visit (INDEPENDENT_AMBULATORY_CARE_PROVIDER_SITE_OTHER): Payer: BC Managed Care – PPO | Admitting: Physician Assistant

## 2014-07-24 ENCOUNTER — Encounter: Payer: Self-pay | Admitting: Physician Assistant

## 2014-07-24 VITALS — BP 106/80 | HR 71 | Temp 98.6°F | Resp 16 | Ht 70.0 in | Wt 159.0 lb

## 2014-07-24 DIAGNOSIS — R8290 Unspecified abnormal findings in urine: Secondary | ICD-10-CM

## 2014-07-24 DIAGNOSIS — R82998 Other abnormal findings in urine: Secondary | ICD-10-CM

## 2014-07-24 DIAGNOSIS — R3989 Other symptoms and signs involving the genitourinary system: Secondary | ICD-10-CM

## 2014-07-24 DIAGNOSIS — R399 Unspecified symptoms and signs involving the genitourinary system: Secondary | ICD-10-CM

## 2014-07-24 DIAGNOSIS — K219 Gastro-esophageal reflux disease without esophagitis: Secondary | ICD-10-CM

## 2014-07-24 LAB — POCT URINALYSIS DIPSTICK
BILIRUBIN UA: NEGATIVE
GLUCOSE UA: NEGATIVE
KETONES UA: NEGATIVE
Nitrite, UA: NEGATIVE
Protein, UA: NEGATIVE
SPEC GRAV UA: 1.025
Urobilinogen, UA: 0.2
pH, UA: 5

## 2014-07-24 MED ORDER — RANITIDINE HCL 150 MG PO TABS: 150.0000 mg | ORAL_TABLET | Freq: Two times a day (BID) | ORAL | Status: DC

## 2014-07-24 MED ORDER — CIPROFLOXACIN HCL 500 MG PO TABS: 500.0000 mg | ORAL_TABLET | Freq: Two times a day (BID) | ORAL | Status: DC

## 2014-07-24 NOTE — Progress Notes (Signed)
Patient presents to clinic today c/o 1 weeks of dysuria, urinary urgency and frequency. This is associated with some suprapubic pressure. Patient denies fever, chills, nausea or vomiting. Denies flank pain.  Patient has also been experiencing some acid reflux over the past couple of weeks. Denies change to diet. Has history of hiatal hernia. Denies abdominal pain. No alcohol consumption or increased NSAID use.  Past Medical History  Diagnosis Date  . Hypertension   . Depression   . Migraine   . Irregular heart rhythm     unclear what arrhythmia  . Pneumothorax   . Torus palatinus   . Supraventricular tachycardia, paroxysmal     Current Outpatient Prescriptions on File Prior to Visit  Medication Sig Dispense Refill  . BYSTOLIC 10 MG tablet TAKE 2 TABLETS BY MOUTH EVERY DAY.  180 tablet  0  . loratadine (CLARITIN) 10 MG tablet Take 1 tablet (10 mg total) by mouth as needed.  30 tablet  2   No current facility-administered medications on file prior to visit.    No Known Allergies  Family History  Problem Relation Age of Onset  . Depression Mother   . Hypertension Father   . Arthritis Other   . Coronary artery disease Father   . Stroke Other   . Mental illness Other   . Heart failure Brother   . Hypertension Maternal Aunt     History   Social History  . Marital Status: Married    Spouse Name: N/A    Number of Children: 2  . Years of Education: N/A   Occupational History  . SALES    Social History Main Topics  . Smoking status: Never Smoker   . Smokeless tobacco: Never Used  . Alcohol Use: No  . Drug Use: No  . Sexual Activity: Yes   Other Topics Concern  . None   Social History Narrative  . None    Review of Systems - See HPI.  All other ROS are negative.  BP 106/80  Pulse 71  Temp(Src) 98.6 F (37 C) (Oral)  Resp 16  Ht 5\' 10"  (1.778 m)  Wt 159 lb (72.122 kg)  BMI 22.81 kg/m2  SpO2 99%  Physical Exam  Vitals reviewed. Constitutional: She is  oriented to person, place, and time and well-developed, well-nourished, and in no distress.  Cardiovascular: Normal rate, regular rhythm, normal heart sounds and intact distal pulses.   Pulmonary/Chest: Effort normal and breath sounds normal. No respiratory distress. She has no wheezes. She has no rales. She exhibits no tenderness.  Abdominal:  Negative CVA tenderness bilaterally.  Neurological: She is alert and oriented to person, place, and time.  Skin: Skin is warm and dry. No rash noted.  Psychiatric: Affect normal.    Recent Results (from the past 2160 hour(s))  POCT URINALYSIS DIPSTICK     Status: None   Collection Time    05/01/14  9:48 AM      Result Value Ref Range   Color, UA straw     Comment: Urine cultured   Clarity, UA clear     Glucose, UA negative     Bilirubin, UA negative     Ketones, UA negative     Spec Grav, UA <=1.005     Blood, UA moderate     pH, UA 7.5     Protein, UA negative     Urobilinogen, UA 0.2     Nitrite, UA negative     Leukocytes, UA Trace  URINE CULTURE     Status: None   Collection Time    05/01/14  9:48 AM      Result Value Ref Range   Colony Count NO GROWTH     Organism ID, Bacteria NO GROWTH    POCT URINALYSIS DIPSTICK     Status: Abnormal   Collection Time    07/24/14  8:39 AM      Result Value Ref Range   Color, UA Gold     Clarity, UA cloudy     Glucose, UA neg     Bilirubin, UA neg     Ketones, UA neg     Spec Grav, UA 1.025     Blood, UA Moderate, Hemolyzed     pH, UA 5.0     Protein, UA neg     Urobilinogen, UA 0.2     Nitrite, UA neg     Leukocytes, UA Trace      Assessment/Plan: Gastroesophageal reflux disease without esophagitis Rx ranitidine 150 mg twice daily. Avoid trigger foods. Avoid late-night eating. Elevate head of bed. Call or return to clinic if symptoms not improving.  UTI symptoms Urine dip with trace leukocytes and moderate blood. Will send urine for culture. We'll empirically treat for UTI with  Cipro 500 mg twice daily x3 days. We'll alter antibiotic therapy based on culture results.

## 2014-07-24 NOTE — Patient Instructions (Signed)
Please increase your fluid intake.  Take antibiotic as directed.  Take a daily probiotic (Activia Yogurt, Culturelle, Align).  This will help eradicate the infection and prevent recurrence.  I will call you with your urine culture results.  For indigestion, take Zantac twice daily.  Avoid spicy foods.  Avoid late-night eating.  Asymptomatic Bacteriuria Asymptomatic bacteriuria is the presence of a large number of bacteria in your urine without the usual symptoms of burning or frequent urination. The following conditions increase the risk of asymptomatic bacteriuria:  Diabetes mellitus.  Advanced age.  Pregnancy in the first trimester.  Kidney stones.  Kidney transplants.  Leaky kidney tube valve in young children (reflux). Treatment for this condition is not needed in most people and can lead to other problems such as too much yeast and growth of resistant bacteria. However, some people, such as pregnant women, do need treatment to prevent kidney infection. Asymptomatic bacteriuria in pregnancy is also associated with fetal growth restriction, premature labor, and newborn death. HOME CARE INSTRUCTIONS Monitor your condition for any changes. The following actions may help to relieve any discomfort you are feeling:  Drink enough water and fluids to keep your urine clear or pale yellow. Go to the bathroom more often to keep your bladder empty.  Keep the area around your vagina and rectum clean. Wipe yourself from front to back after urinating. SEEK IMMEDIATE MEDICAL CARE IF:  You develop signs of an infection such as:  Burning with urination.  Frequency of voiding.  Back pain.  Fever.  You have blood in the urine.  You develop a fever. MAKE SURE YOU:  Understand these instructions.  Will watch your condition.  Will get help right away if you are not doing well or get worse. Document Released: 11/20/2005 Document Revised: 04/06/2014 Document Reviewed: 05/12/2013 [facility]  Patient Information 2015 [place]Care, [place]. This information is not intended to replace advice given to you by your health care provider. Make sure you discuss any questions you have with your health care provider.

## 2014-07-24 NOTE — Progress Notes (Signed)
Pre visit review using our clinic review tool, if applicable. No additional management support is needed unless otherwise documented below in the visit note/SLS  

## 2014-07-27 DIAGNOSIS — K219 Gastro-esophageal reflux disease without esophagitis: Secondary | ICD-10-CM | POA: Insufficient documentation

## 2014-07-27 DIAGNOSIS — R399 Unspecified symptoms and signs involving the genitourinary system: Secondary | ICD-10-CM | POA: Insufficient documentation

## 2014-07-27 NOTE — Assessment & Plan Note (Signed)
Urine dip with trace leukocytes and moderate blood. Will send urine for culture. We'll empirically treat for UTI with Cipro 500 mg twice daily x3 days. We'll alter antibiotic therapy based on culture results.

## 2014-07-27 NOTE — Assessment & Plan Note (Signed)
Rx ranitidine 150 mg twice daily. Avoid trigger foods. Avoid late-night eating. Elevate head of bed. Call or return to clinic if symptoms not improving.

## 2014-07-29 LAB — CULTURE, URINE COMPREHENSIVE
COLONY COUNT: NO GROWTH
Organism ID, Bacteria: NO GROWTH

## 2014-07-30 ENCOUNTER — Ambulatory Visit (INDEPENDENT_AMBULATORY_CARE_PROVIDER_SITE_OTHER): Payer: BC Managed Care – PPO | Admitting: Emergency Medicine

## 2014-07-30 VITALS — BP 130/90 | HR 76 | Temp 98.8°F | Resp 16 | Ht 70.5 in | Wt 157.0 lb

## 2014-07-30 DIAGNOSIS — I10 Essential (primary) hypertension: Secondary | ICD-10-CM

## 2014-07-30 DIAGNOSIS — R252 Cramp and spasm: Secondary | ICD-10-CM

## 2014-07-30 DIAGNOSIS — F411 Generalized anxiety disorder: Secondary | ICD-10-CM

## 2014-07-30 NOTE — Patient Instructions (Signed)

## 2014-07-30 NOTE — Progress Notes (Signed)
[facility] [address], [place] [address] [phone]  Date:  07/30/2014   Name:  ARSHIYA JAKES   DOB:  10-05-1961   MRN:  [id number]  PCP:  Lemont Fillers., NP    Chief Complaint: Leg Pain   History of Present Illness:  [name] is a 53 y.o. very pleasant female patient who presents with the following:  Has a history of hypertension and SVT and PVC's. Out of brevibloc for at least two weeks, perhaps more.  Says she tapered the dose to zero.  No episodes of SVT since. Is concerned today about pain in medial distal lower leg above ankle.  No history of injury.  No overuse.  No swelling or ecchymosis.  Often has crampy pain in mid calf. No recent travel or immobilization, no anesthesia.  Non smoker.  No OCP.  History of TAH BSO.   No improvement with over the counter medications or other home remedies. Denies other complaint or health concern today.   Patient Active Problem List   Diagnosis Date Noted  . UTI symptoms 07/27/2014  . Gastroesophageal reflux disease without esophagitis 07/27/2014  . Right flank pain 05/01/2014  . Gastroenteritis 12/23/2013  . SVT (supraventricular tachycardia) 12/27/2012  . DEPRESSION 11/17/2009  . ANEMIA 02/03/2009  . 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 ANXIETY 01/21/2009  . COMMON MIGRAINE 01/21/2009  . HYPERTENSION 01/21/2009  . CARDIAC ARRHYTHMIA 01/21/2009    Past Medical History  Diagnosis Date  . Hypertension   . Depression   . Migraine   . Irregular heart rhythm     unclear what arrhythmia  . Pneumothorax   . Torus palatinus   . Supraventricular tachycardia, paroxysmal     Past Surgical History  Procedure Laterality Date  . Cesarean section      x 2  . Abdominal hysterectomy  2005  . Pleural scarification      History  Substance Use Topics  . Smoking status: Never Smoker   . Smokeless tobacco: Never Used  . Alcohol Use: No    Family History  Problem Relation Age  of Onset  . Depression Mother   . Diabetes Mother   . Heart disease Mother   . Hypertension Mother   . Hypertension Father   . Coronary artery disease Father   . Heart disease Father   . Arthritis Other   . Stroke Other   . Mental illness Other   . Heart failure Brother   . Heart disease Brother   . Hypertension Brother   . Hypertension Maternal Aunt   . Hypertension Sister   . Hypertension Brother   . Hypertension Sister     No Known Allergies  Medication list has been reviewed and updated.  Current Outpatient Prescriptions on File Prior to Visit  Medication Sig Dispense Refill  . BYSTOLIC 10 MG tablet TAKE 2 TABLETS BY MOUTH EVERY DAY.  180 tablet  0  . loratadine (CLARITIN) 10 MG tablet Take 1 tablet (10 mg total) by mouth as needed.  30 tablet  2  . ranitidine (ZANTAC) 150 MG tablet Take 1 tablet (150 mg total) by mouth 2 (two) times daily.  60 tablet  0   No current facility-administered medications on file prior to visit.    Review of Systems:  As per HPI, otherwise negative.    Physical Examination: Filed Vitals:   07/30/14 1030  BP: 130/90  Pulse: 76  Temp: 98.8 F (37.1 C)  Resp:  16   Filed Vitals:   07/30/14 1030  Height: 5' 10.5" (1.791 m)  Weight: 157 lb (71.215 kg)   Body mass index is 22.2 kg/(m^2). Ideal Body Weight: Weight in (lb) to have BMI = 25: 176.4   GEN: WDWN, NAD, Non-toxic, Alert & Oriented x 3 HEENT: Atraumatic, Normocephalic.  Ears and Nose: No external deformity. CHEST:  Clear BBS= COR RRR ABD:  Sort NT no masses or megaly EXTR: No clubbing/cyanosis/edema NEURO: Normal gait.  PSYCH: Normally interactive. Conversant. Not depressed or anxious appearing.  Calm demeanor.    Assessment and Plan: Leg cramps Hypertension PSVT CMP  Signed,  Phillips Odor, MD

## 2014-07-31 LAB — COMPREHENSIVE METABOLIC PANEL
ALT: 16 U/L (ref 0–35)
AST: 18 U/L (ref 0–37)
Albumin: 4.5 g/dL (ref 3.5–5.2)
Alkaline Phosphatase: 78 U/L (ref 39–117)
BILIRUBIN TOTAL: 0.5 mg/dL (ref 0.2–1.2)
BUN: 12 mg/dL (ref 6–23)
CALCIUM: 9.2 mg/dL (ref 8.4–10.5)
CHLORIDE: 104 meq/L (ref 96–112)
CO2: 29 meq/L (ref 19–32)
CREATININE: 0.73 mg/dL (ref 0.50–1.10)
Glucose, Bld: 82 mg/dL (ref 70–99)
Potassium: 4.3 mEq/L (ref 3.5–5.3)
SODIUM: 141 meq/L (ref 135–145)
TOTAL PROTEIN: 7.1 g/dL (ref 6.0–8.3)

## 2014-07-31 LAB — CK: CK TOTAL: 135 U/L (ref 7–177)

## 2014-08-20 ENCOUNTER — Telehealth: Payer: Self-pay | Admitting: Family

## 2014-08-20 NOTE — Telephone Encounter (Signed)
Request refill on ALPRAZolam (XANAX) 0.25 MG

## 2014-08-21 NOTE — Telephone Encounter (Signed)
Notified pt and she states she will call back to schedule appt.

## 2014-08-21 NOTE — Telephone Encounter (Signed)
Left message for pt to return my call.

## 2014-08-21 NOTE — Telephone Encounter (Signed)
Rx not on current med list but can see it was prescribed in the past.  Please advise.

## 2014-08-21 NOTE — Telephone Encounter (Signed)
Notes that she only takes medication on an as needed basis and has not needed monthly refills.

## 2014-08-21 NOTE — Telephone Encounter (Signed)
This has not been prescribed in >1 year.  She needs to schedule office visit for re-evaluation of her anxiety before additional refills can be provided.

## 2014-10-09 ENCOUNTER — Telehealth: Payer: Self-pay

## 2014-10-09 DIAGNOSIS — K219 Gastro-esophageal reflux disease without esophagitis: Secondary | ICD-10-CM

## 2014-10-09 MED ORDER — RANITIDINE HCL 150 MG PO TABS: 150.0000 mg | ORAL_TABLET | Freq: Two times a day (BID) | ORAL | Status: DC

## 2014-10-09 NOTE — Telephone Encounter (Signed)
Renee BoardsCynthia Wolfe [phone] [facility]  [address] called she needs a refill on her ranitidine (ZANTAC) 150 MG tablet

## 2014-10-09 NOTE — Telephone Encounter (Signed)
Refll sent, left message re: rx completion and to call if any questions.

## 2014-10-12 ENCOUNTER — Other Ambulatory Visit: Payer: Self-pay | Admitting: *Deleted

## 2014-10-12 DIAGNOSIS — K219 Gastro-esophageal reflux disease without esophagitis: Secondary | ICD-10-CM

## 2014-10-12 MED ORDER — RANITIDINE HCL 150 MG PO TABS: 150.0000 mg | ORAL_TABLET | Freq: Two times a day (BID) | ORAL | Status: DC

## 2014-10-12 NOTE — Telephone Encounter (Signed)
Rx request to pharmacy/SLS  

## 2014-10-19 ENCOUNTER — Encounter: Payer: Self-pay | Admitting: Family

## 2014-10-19 ENCOUNTER — Ambulatory Visit (INDEPENDENT_AMBULATORY_CARE_PROVIDER_SITE_OTHER): Payer: BC Managed Care – PPO | Admitting: Family

## 2014-10-19 VITALS — BP 130/80 | HR 88 | Temp 98.9°F | Resp 16 | Ht 70.0 in | Wt 154.8 lb

## 2014-10-19 DIAGNOSIS — G43009 Migraine without aura, not intractable, without status migrainosus: Secondary | ICD-10-CM

## 2014-10-19 DIAGNOSIS — I10 Essential (primary) hypertension: Secondary | ICD-10-CM

## 2014-10-19 DIAGNOSIS — F411 Generalized anxiety disorder: Secondary | ICD-10-CM

## 2014-10-19 DIAGNOSIS — K219 Gastro-esophageal reflux disease without esophagitis: Secondary | ICD-10-CM

## 2014-10-19 NOTE — Assessment & Plan Note (Deleted)
Seems stress related.  We discussed referral to therapist. Gave her # for [facility]. She wishes to avoid meds which I agree with at this time.

## 2014-10-19 NOTE — Assessment & Plan Note (Signed)
BP stable off of meds.  Monitor.  

## 2014-10-19 NOTE — Assessment & Plan Note (Signed)
Improved now that she is back on zantac.

## 2014-10-19 NOTE — Assessment & Plan Note (Signed)
Seems stress related.  We discussed referral to therapist. Gave her # for [facility]. She wishes to avoid meds which I agree with at this time.   

## 2014-10-19 NOTE — Progress Notes (Signed)
Pre visit review using our clinic review tool, if applicable. No additional management support is needed unless otherwise documented below in the visit note. 

## 2014-10-19 NOTE — Assessment & Plan Note (Signed)
Stable

## 2014-10-19 NOTE — Patient Instructions (Signed)
Please follow up in 3 months for Blood pressure recheck. Contact [facility] to arrange an appointment with one of our therapists.  Continue zantac.

## 2014-10-19 NOTE — Progress Notes (Signed)
Subjective:    Patient ID: Renee BoardsCynthia Wolfe, female    DOB: 12-17-1960, 53 y.o.   MRN: [id number]  HPI  Pt presents today for follow up.    HTN-  Has been out of bystolic x 4 months.  BP Readings from Last 3 Encounters:  10/19/14 130/80  07/30/14 130/90  07/24/14 106/80   Depression/anxiety- + stress, father died, in middle of divorce.  Has diarrhea when she gets stressed.  Reports that she sleeps ok.  Not tearful. Denies hopelessness.    Migraines- no recent migraines, has had some mild slight headaches.  GERD- maintained on zantac. Did have some burning after eating, she had run out of H2B and realized that this was contributing. Restarted zantac a few days ago.   Wt Readings from Last 3 Encounters:  10/19/14 154 lb 12.8 oz (70.217 kg)  07/30/14 157 lb (71.215 kg)  07/24/14 159 lb (72.122 kg)         Review of Systems See HPI  Past Medical History  Diagnosis Date  . Hypertension   . Depression   . Migraine   . Irregular heart rhythm     unclear what arrhythmia  . Pneumothorax   . Torus palatinus   . Supraventricular tachycardia, paroxysmal     History   Social History  . Marital Status: Married    Spouse Name: N/A    Number of Children: 2  . Years of Education: N/A   Occupational History  . SALES    Social History Main Topics  . Smoking status: Never Smoker   . Smokeless tobacco: Never Used  . Alcohol Use: No  . Drug Use: No  . Sexual Activity: Yes   Other Topics Concern  . Not on file   Social History Narrative    Past Surgical History  Procedure Laterality Date  . Cesarean section      x 2  . Abdominal hysterectomy  2005  . Pleural scarification      Family History  Problem Relation Age of Onset  . Depression Mother   . Diabetes Mother   . Heart disease Mother   . Hypertension Mother   . Hypertension Father   . Coronary artery disease Father   . Heart disease Father   . Arthritis Other   . Stroke Other   . Mental  illness Other   . Heart failure Brother   . Heart disease Brother   . Hypertension Brother   . Hypertension Maternal Aunt   . Hypertension Sister   . Hypertension Brother   . Hypertension Sister     No Known Allergies  Current Outpatient Prescriptions on File Prior to Visit  Medication Sig Dispense Refill  . loratadine (CLARITIN) 10 MG tablet Take 1 tablet (10 mg total) by mouth as needed. 30 tablet 2  . ranitidine (ZANTAC) 150 MG tablet Take 1 tablet (150 mg total) by mouth 2 (two) times daily. 60 tablet 0   No current facility-administered medications on file prior to visit.    BP 130/80 mmHg  Pulse 88  Temp(Src) 98.9 F (37.2 C) (Oral)  Resp 16  Ht 5\' 10"  (1.778 m)  Wt 154 lb 12.8 oz (70.217 kg)  BMI 22.21 kg/m2  SpO2 99%        Objective:   Physical Exam  Constitutional: She is oriented to person, place, and time. She appears well-developed and well-nourished. No distress.  Cardiovascular: Normal rate and regular rhythm.   No murmur heard. Pulmonary/Chest: Effort  normal and breath sounds normal. No respiratory distress. She has no wheezes. She has no rales. She exhibits no tenderness.  Neurological: She is alert and oriented to person, place, and time.  Psychiatric: She has a normal mood and affect. Her behavior is normal. Judgment and thought content normal.          Assessment & Plan:

## 2014-11-03 ENCOUNTER — Encounter: Payer: Self-pay | Admitting: Family Medicine

## 2014-11-03 ENCOUNTER — Ambulatory Visit (INDEPENDENT_AMBULATORY_CARE_PROVIDER_SITE_OTHER): Payer: BC Managed Care – PPO | Admitting: Family Medicine

## 2014-11-03 VITALS — BP 142/88 | HR 69 | Temp 98.8°F | Ht 70.0 in | Wt 153.2 lb

## 2014-11-03 DIAGNOSIS — I10 Essential (primary) hypertension: Secondary | ICD-10-CM

## 2014-11-03 DIAGNOSIS — R1013 Epigastric pain: Secondary | ICD-10-CM

## 2014-11-03 DIAGNOSIS — R112 Nausea with vomiting, unspecified: Secondary | ICD-10-CM

## 2014-11-03 DIAGNOSIS — D649 Anemia, unspecified: Secondary | ICD-10-CM

## 2014-11-03 DIAGNOSIS — K219 Gastro-esophageal reflux disease without esophagitis: Secondary | ICD-10-CM

## 2014-11-03 DIAGNOSIS — R197 Diarrhea, unspecified: Secondary | ICD-10-CM

## 2014-11-03 DIAGNOSIS — R634 Abnormal weight loss: Secondary | ICD-10-CM | POA: Diagnosis not present

## 2014-11-03 DIAGNOSIS — I471 Supraventricular tachycardia, unspecified: Secondary | ICD-10-CM

## 2014-11-03 MED ORDER — SUCRALFATE 1 GM/10ML PO SUSP: 1.0000 g | Freq: Three times a day (TID) | ORAL | Status: DC

## 2014-11-03 NOTE — Patient Instructions (Addendum)
Clear fluids, gatorade or ginger ale BRAT (banans, rice applesauce toast  Ginger caps Probiotics such as Digestive Advantage or Phillip's Colon Health  Cholelithiasis Cholelithiasis (also called gallstones) is a form of gallbladder disease. The gallbladder is a small organ that helps you digest fats. Symptoms of gallstones are:  Feeling sick to your stomach (nausea).  Throwing up (vomiting).  Belly pain.  Yellowing of the skin (jaundice).  Sudden pain. You may feel the pain for minutes to hours.  Fever.  Pain to the touch. HOME CARE  Only take medicines as told by your doctor.  Eat a low-fat diet until you see your doctor again. Eating fat can result in pain.  Follow up with your doctor as told. Attacks usually happen time after time. Surgery is usually needed for permanent treatment. GET HELP RIGHT AWAY IF:   Your pain gets worse.  Your pain is not helped by medicines.  You have a fever and lasting symptoms for more than 2-3 days.  You have a fever and your symptoms suddenly get worse.  You keep feeling sick to your stomach and throwing up. MAKE SURE YOU:   Understand these instructions.  Will watch your condition.  Will get help right away if you are not doing well or get worse. Document Released: 05/08/2008 Document Revised: 07/23/2013 Document Reviewed: 05/14/2013 [facility] Patient Information 2015 [place], [place]. This information is not intended to replace advice given to you by your health care provider. Make sure you discuss any questions you have with your health care provider.

## 2014-11-03 NOTE — Progress Notes (Signed)
Pre visit review using our clinic review tool, if applicable. No additional management support is needed unless otherwise documented below in the visit note. 

## 2014-11-04 LAB — CBC
HEMATOCRIT: 34 % — AB (ref 36.0–46.0)
Hemoglobin: 10.9 g/dL — ABNORMAL LOW (ref 12.0–15.0)
MCHC: 32.1 g/dL (ref 30.0–36.0)
MCV: 73.3 fl — AB (ref 78.0–100.0)
Platelets: 338 10*3/uL (ref 150.0–400.0)
RBC: 4.64 Mil/uL (ref 3.87–5.11)
RDW: 16 % — AB (ref 11.5–15.5)
WBC: 8.5 10*3/uL (ref 4.0–10.5)

## 2014-11-04 LAB — RENAL FUNCTION PANEL
Albumin: 4 g/dL (ref 3.5–5.2)
BUN: 13 mg/dL (ref 6–23)
CO2: 28 meq/L (ref 19–32)
Calcium: 9 mg/dL (ref 8.4–10.5)
Chloride: 102 mEq/L (ref 96–112)
Creatinine, Ser: 0.7 mg/dL (ref 0.4–1.2)
GFR: 121.4 mL/min (ref >60.00)
Glucose, Bld: 89 mg/dL (ref 70–99)
Phosphorus: 4.1 mg/dL (ref 2.3–4.6)
Potassium: 4.2 mEq/L (ref 3.5–5.1)
SODIUM: 137 meq/L (ref 135–145)

## 2014-11-04 LAB — HEPATIC FUNCTION PANEL
ALK PHOS: 79 U/L (ref 39–117)
ALT: 19 U/L (ref 0–35)
AST: 18 U/L (ref 0–37)
Albumin: 4 g/dL (ref 3.5–5.2)
Bilirubin, Direct: 0.1 mg/dL (ref 0.0–0.3)
Total Bilirubin: 0.5 mg/dL (ref 0.2–1.2)
Total Protein: 7.2 g/dL (ref 6.0–8.3)

## 2014-11-04 LAB — LIPASE: LIPASE: 46 U/L (ref 11.0–59.0)

## 2014-11-04 LAB — SEDIMENTATION RATE: SED RATE: 51 mm/h — AB (ref 0–22)

## 2014-11-05 ENCOUNTER — Ambulatory Visit (HOSPITAL_BASED_OUTPATIENT_CLINIC_OR_DEPARTMENT_OTHER)
Admission: RE | Admit: 2014-11-05 | Discharge: 2014-11-05 | Disposition: A | Discharge status: Home / Self Care | Payer: BC Managed Care – PPO | Source: Ambulatory Visit | Attending: Family Medicine | Admitting: Family Medicine

## 2014-11-05 ENCOUNTER — Other Ambulatory Visit: Payer: Self-pay

## 2014-11-05 DIAGNOSIS — R112 Nausea with vomiting, unspecified: Secondary | ICD-10-CM | POA: Diagnosis not present

## 2014-11-05 DIAGNOSIS — R079 Chest pain, unspecified: Secondary | ICD-10-CM | POA: Diagnosis not present

## 2014-11-05 DIAGNOSIS — R197 Diarrhea, unspecified: Secondary | ICD-10-CM

## 2014-11-05 DIAGNOSIS — R634 Abnormal weight loss: Secondary | ICD-10-CM

## 2014-11-05 DIAGNOSIS — G8929 Other chronic pain: Secondary | ICD-10-CM | POA: Diagnosis not present

## 2014-11-05 DIAGNOSIS — R1013 Epigastric pain: Secondary | ICD-10-CM | POA: Diagnosis not present

## 2014-11-05 LAB — H. PYLORI BREATH TEST: H. pylori Breath Test: NOT DETECTED

## 2014-11-05 MED ORDER — FERROUS FUMARATE-FOLIC ACID 324-1 MG PO TABS: 1.0000 | ORAL_TABLET | Freq: Every day | ORAL | Status: DC

## 2014-11-06 ENCOUNTER — Other Ambulatory Visit: Payer: Self-pay | Admitting: Family Medicine

## 2014-11-06 DIAGNOSIS — R1013 Epigastric pain: Secondary | ICD-10-CM

## 2014-11-08 ENCOUNTER — Encounter: Payer: Self-pay | Admitting: Family Medicine

## 2014-11-08 NOTE — Assessment & Plan Note (Addendum)
Start an iron supplement and referred to GI for further consideration given her abdominal symptoms and abdominal pain. Ultrasound of abdomen unremarkable.

## 2014-11-08 NOTE — Progress Notes (Signed)
[name]  [id number] November 08, 1961 11/08/2014      Progress Note-Follow Up  Subjective  Chief Complaint  Chief Complaint  Patient presents with  . Abdominal Pain    upper abd pain X 1 week    HPI  Patient is a 53 y.o. female in today for routine medical care. Patient is in today c/o worsening epigastric pain for past week has had trouble off and on for a wile but this week it worsening. No bloody or tarry stool noted. Notes poor appetite and reflux frequently. Also notes fatigue. Notes some chest disfort and belching at times. Denies palp/SOB/HA/congestion or GU c/o. Taking meds as prescribed  Past Medical History  Diagnosis Date  . Hypertension   . Depression   . Migraine   . Irregular heart rhythm     unclear what arrhythmia  . Pneumothorax   . Torus palatinus   . Supraventricular tachycardia, paroxysmal     Past Surgical History  Procedure Laterality Date  . Cesarean section      x 2  . Abdominal hysterectomy  2005  . Pleural scarification      Family History  Problem Relation Age of Onset  . Depression Mother   . Diabetes Mother   . Heart disease Mother   . Hypertension Mother   . Hypertension Father   . Coronary artery disease Father   . Heart disease Father   . Arthritis Other   . Stroke Other   . Mental illness Other   . Heart failure Brother   . Heart disease Brother   . Hypertension Brother   . Hypertension Maternal Aunt   . Hypertension Sister   . Hypertension Brother   . Hypertension Sister     History   Social History  . Marital Status: Legally Separated    Spouse Name: N/A    Number of Children: 2  . Years of Education: N/A   Occupational History  . SALES    Social History Main Topics  . Smoking status: Never Smoker   . Smokeless tobacco: Never Used  . Alcohol Use: No  . Drug Use: No  . Sexual Activity: Yes   Other Topics Concern  . Not on file   Social History Narrative    Current Outpatient Prescriptions on  File Prior to Visit  Medication Sig Dispense Refill  . ranitidine (ZANTAC) 150 MG tablet Take 1 tablet (150 mg total) by mouth 2 (two) times daily. 60 tablet 0  . loratadine (CLARITIN) 10 MG tablet Take 1 tablet (10 mg total) by mouth as needed. (Patient not taking: Reported on 11/03/2014) 30 tablet 2   No current facility-administered medications on file prior to visit.    No Known Allergies  Review of Systems  Review of Systems  Constitutional: Negative for fever and malaise/fatigue.  HENT: Negative for congestion.   Eyes: Negative for discharge.  Respiratory: Negative for shortness of breath.   Cardiovascular: Negative for chest pain, palpitations and leg swelling.  Gastrointestinal: Positive for heartburn, nausea and abdominal pain. Negative for vomiting, diarrhea, constipation, blood in stool and melena.  Genitourinary: Negative for dysuria.  Musculoskeletal: Negative for falls.  Skin: Negative for rash.  Neurological: Negative for loss of consciousness and headaches.  Endo/Heme/Allergies: Negative for polydipsia.  Psychiatric/Behavioral: Negative for depression and suicidal ideas. The patient is nervous/anxious. The patient does not have insomnia.     Objective  BP 142/88 mmHg  Pulse 69  Temp(Src) 98.8 F (37.1 C) (Oral)  Ht  5\' 10"  (1.778 m)  Wt 153 lb 3.2 oz (69.491 kg)  BMI 21.98 kg/m2  SpO2 100%  Physical Exam  Physical Exam  Constitutional: She is oriented to person, place, and time and well-developed, well-nourished, and in no distress. No distress.  HENT:  Head: Normocephalic and atraumatic.  Eyes: Conjunctivae are normal.  Neck: Neck supple. No thyromegaly present.  Cardiovascular: Normal rate, regular rhythm and normal heart sounds.   No murmur heard. Pulmonary/Chest: Effort normal and breath sounds normal. She has no wheezes.  Abdominal: She exhibits no distension and no mass.  Musculoskeletal: She exhibits no edema.  Lymphadenopathy:    She has no  cervical adenopathy.  Neurological: She is alert and oriented to person, place, and time.  Skin: Skin is warm and dry. No rash noted. She is not diaphoretic.  Psychiatric: Memory, affect and judgment normal.    Lab Results  Component Value Date   TSH 0.661 02/17/2012   Lab Results  Component Value Date   WBC 8.5 11/03/2014   HGB 10.9* 11/03/2014   HCT 34.0* 11/03/2014   MCV 73.3* 11/03/2014   PLT 338.0 11/03/2014   Lab Results  Component Value Date   CREATININE 0.7 11/03/2014   BUN 13 11/03/2014   NA 137 11/03/2014   K 4.2 11/03/2014   CL 102 11/03/2014   CO2 28 11/03/2014   Lab Results  Component Value Date   ALT 19 11/03/2014   AST 18 11/03/2014   ALKPHOS 79 11/03/2014   BILITOT 0.5 11/03/2014   Lab Results  Component Value Date   CHOL 191 01/19/2010   Lab Results  Component Value Date   HDL 58 01/19/2010   Lab Results  Component Value Date   LDLCALC 124* 01/19/2010   Lab Results  Component Value Date   TRIG 46 01/19/2010   Lab Results  Component Value Date   CHOLHDL 3.3 Ratio 01/19/2010     Assessment & Plan  Essential hypertension Elevated with acute pain. Encouraged heart healthy diet such as the DASH diet and exercise as tolerated.   Anemia Start an iron supplement and referred to GI for further consideration given her abdominal symptoms and abdominal pain. Ultrasound of abdomen unremarkable.   SVT (supraventricular tachycardia) RRR today  Gastroesophageal reflux disease without esophagitis Avoid offending foods, start probiotics. Do not eat large meals in late evening and consider raising head of bed.

## 2014-11-08 NOTE — Assessment & Plan Note (Signed)
RRR today 

## 2014-11-08 NOTE — Assessment & Plan Note (Signed)
Avoid offending foods, start probiotics. Do not eat large meals in late evening and consider raising head of bed.  

## 2014-11-08 NOTE — Assessment & Plan Note (Signed)
Elevated with acute pain. Encouraged heart healthy diet such as the DASH diet and exercise as tolerated.

## 2014-11-09 ENCOUNTER — Encounter: Payer: Self-pay | Admitting: Physician Assistant

## 2014-11-10 ENCOUNTER — Encounter: Payer: Self-pay | Admitting: Medical

## 2014-11-10 ENCOUNTER — Emergency Department (HOSPITAL_BASED_OUTPATIENT_CLINIC_OR_DEPARTMENT_OTHER)
Admission: EM | Admit: 2014-11-10 | Discharge: 2014-11-10 | Disposition: A | Discharge status: Home / Self Care | Payer: BC Managed Care – PPO | Attending: Emergency Medicine | Admitting: Emergency Medicine

## 2014-11-10 ENCOUNTER — Telehealth: Payer: Self-pay | Admitting: Family

## 2014-11-10 ENCOUNTER — Encounter (HOSPITAL_BASED_OUTPATIENT_CLINIC_OR_DEPARTMENT_OTHER): Payer: Self-pay | Admitting: *Deleted

## 2014-11-10 ENCOUNTER — Ambulatory Visit (INDEPENDENT_AMBULATORY_CARE_PROVIDER_SITE_OTHER): Payer: BC Managed Care – PPO | Admitting: Medical

## 2014-11-10 ENCOUNTER — Emergency Department (HOSPITAL_BASED_OUTPATIENT_CLINIC_OR_DEPARTMENT_OTHER): Payer: BC Managed Care – PPO

## 2014-11-10 VITALS — BP 157/80 | HR 92 | Temp 99.0°F | Ht 70.0 in | Wt 152.0 lb

## 2014-11-10 DIAGNOSIS — R079 Chest pain, unspecified: Secondary | ICD-10-CM | POA: Diagnosis present

## 2014-11-10 DIAGNOSIS — Z8659 Personal history of other mental and behavioral disorders: Secondary | ICD-10-CM | POA: Diagnosis not present

## 2014-11-10 DIAGNOSIS — J159 Unspecified bacterial pneumonia: Secondary | ICD-10-CM | POA: Insufficient documentation

## 2014-11-10 DIAGNOSIS — Z8739 Personal history of other diseases of the musculoskeletal system and connective tissue: Secondary | ICD-10-CM | POA: Insufficient documentation

## 2014-11-10 DIAGNOSIS — I10 Essential (primary) hypertension: Secondary | ICD-10-CM | POA: Insufficient documentation

## 2014-11-10 DIAGNOSIS — J189 Pneumonia, unspecified organism: Secondary | ICD-10-CM

## 2014-11-10 DIAGNOSIS — Z8709 Personal history of other diseases of the respiratory system: Secondary | ICD-10-CM

## 2014-11-10 DIAGNOSIS — Z79899 Other long term (current) drug therapy: Secondary | ICD-10-CM | POA: Diagnosis not present

## 2014-11-10 LAB — CBC
HCT: 31 % — ABNORMAL LOW (ref 36.0–46.0)
Hemoglobin: 10.5 g/dL — ABNORMAL LOW (ref 12.0–15.0)
MCH: 23.4 pg — ABNORMAL LOW (ref 26.0–34.0)
MCHC: 33.9 g/dL (ref 30.0–36.0)
MCV: 69.2 fL — ABNORMAL LOW (ref 78.0–100.0)
PLATELETS: 351 10*3/uL (ref 150–400)
RBC: 4.48 MIL/uL (ref 3.87–5.11)
RDW: 15.1 % (ref 11.5–15.5)
WBC: 8.1 10*3/uL (ref 4.0–10.5)

## 2014-11-10 LAB — BASIC METABOLIC PANEL
ANION GAP: 12 (ref 5–15)
BUN: 7 mg/dL (ref 6–23)
CALCIUM: 9.1 mg/dL (ref 8.4–10.5)
CO2: 28 meq/L (ref 19–32)
Chloride: 104 mEq/L (ref 96–112)
Creatinine, Ser: 0.6 mg/dL (ref 0.50–1.10)
GFR calc Af Amer: >90 mL/min (ref >90)
GFR calc non Af Amer: >90 mL/min (ref >90)
Glucose, Bld: 99 mg/dL (ref 70–99)
Potassium: 3.5 mEq/L — ABNORMAL LOW (ref 3.7–5.3)
SODIUM: 144 meq/L (ref 137–147)

## 2014-11-10 LAB — TROPONIN I

## 2014-11-10 LAB — D-DIMER, QUANTITATIVE: D-Dimer, Quant: 1.32 ug/mL-FEU — ABNORMAL HIGH (ref 0.00–0.48)

## 2014-11-10 MED ORDER — AZITHROMYCIN 250 MG PO TABS
500.0000 mg | ORAL_TABLET | Freq: Once | ORAL | Status: AC
  Administered 2014-11-10: 500 mg via ORAL
  Filled 2014-11-10: qty 2

## 2014-11-10 MED ORDER — IOHEXOL 350 MG/ML SOLN
100.0000 mL | Freq: Once | INTRAVENOUS | Status: AC | PRN
  Administered 2014-11-10: 100 mL via INTRAVENOUS

## 2014-11-10 MED ORDER — CEFTRIAXONE SODIUM 1 G IJ SOLR
1.0000 g | Freq: Once | INTRAMUSCULAR | Status: AC
  Administered 2014-11-10: 1 g via INTRAVENOUS

## 2014-11-10 MED ORDER — AZITHROMYCIN 250 MG PO TABS: 250.0000 mg | ORAL_TABLET | Freq: Every day | ORAL | Status: DC

## 2014-11-10 MED ORDER — CEFTRIAXONE SODIUM 1 G IJ SOLR
INTRAMUSCULAR | Status: AC
  Filled 2014-11-10: qty 10

## 2014-11-10 NOTE — Telephone Encounter (Signed)
Patient needs 1 week hospital follow up please.

## 2014-11-10 NOTE — Progress Notes (Signed)
Subjective:    Patient ID: Renee BoardsCynthia Wolfe, female    DOB: Mar 19, 1961, 53 y.o.   MRN: [id number]  HPI     Pt reports some rt side chest pain and history of collapsed lung years ago. Pain recent rt lower lung file region for 1 wk. Last  2 days was worse but subsided some last night. She had some severe pain 2 days ago. She still has some faint pain in rt side costochondral junction and some distribution of rt lower ribs. Pt has low grade fever last night. Some rt lower costochondral junction tenderness. Pt is having some episodes since this past wed. Had some shortness of breath. History of lung collapse various times. She states history of intermittent random in the pst. Pt states she had some sort of surgery to prevent further collapse in future. Also last week when climbing 3 flight of stairs going to her apartment she feels like she is about to pass out.   Pt in states she saw Dr. Rogelia RohrerBlythe for pain possible gallbladder and h.pylori.  Pt states she is eating better no. Less burping since she last saw Dr. Rogelia RohrerBlythe.  Past Medical History  Diagnosis Date  . Hypertension   . Depression   . Migraine   . Irregular heart rhythm     unclear what arrhythmia  . Pneumothorax   . Torus palatinus   . Supraventricular tachycardia, paroxysmal     History   Social History  . Marital Status: Legally Separated    Spouse Name: N/A    Number of Children: 2  . Years of Education: N/A   Occupational History  . SALES    Social History Main Topics  . Smoking status: Never Smoker   . Smokeless tobacco: Never Used  . Alcohol Use: No  . Drug Use: No  . Sexual Activity: Yes   Other Topics Concern  . Not on file   Social History Narrative    Past Surgical History  Procedure Laterality Date  . Cesarean section      x 2  . Abdominal hysterectomy  2005  . Pleural scarification      Family History  Problem Relation Age of Onset  . Depression Mother   . Diabetes Mother   . Heart  disease Mother   . Hypertension Mother   . Hypertension Father   . Coronary artery disease Father   . Heart disease Father   . Arthritis Other   . Stroke Other   . Mental illness Other   . Heart failure Brother   . Heart disease Brother   . Hypertension Brother   . Hypertension Maternal Aunt   . Hypertension Sister   . Hypertension Brother   . Hypertension Sister     No Known Allergies  Current Outpatient Prescriptions on File Prior to Visit  Medication Sig Dispense Refill  . Ferrous Fumarate-Folic Acid (HEMOCYTE-F) 324-1 MG TABS Take 1 tablet by mouth daily. 30 each 2  . loratadine (CLARITIN) 10 MG tablet Take 1 tablet (10 mg total) by mouth as needed. 30 tablet 2  . ranitidine (ZANTAC) 150 MG tablet Take 1 tablet (150 mg total) by mouth 2 (two) times daily. 60 tablet 0  . sucralfate (CARAFATE) 1 GM/10ML suspension Take 10 mLs (1 g total) by mouth 4 (four) times daily -  with meals and at bedtime. Prn pain, dyspepsia 420 mL 1   No current facility-administered medications on file prior to visit.    BP 157/80 mmHg  Pulse 92  Temp(Src) 99 F (37.2 C) (Oral)  Ht 5\' 10"  (1.778 m)  Wt 152 lb (68.947 kg)  BMI 21.81 kg/m2  SpO2 100%       Review of Systems  Constitutional: Positive for fever and fatigue. Negative for diaphoresis.       Subjective last night.  Respiratory: Positive for shortness of breath. Negative for cough, chest tightness and wheezing.   Cardiovascular: Negative for chest pain and palpitations.       Rt side rib region pain. No mid sternal or left side pain.  Musculoskeletal:       No leg pain.  Neurological: Positive for dizziness. Negative for tremors, seizures, syncope, facial asymmetry, speech difficulty, light-headedness, numbness and headaches.       On ambulating down hall got dizzy.       Objective:   Physical Exam  Constitutional: She is oriented to person, place, and time. She appears well-developed and well-nourished. No distress.    HENT:  Head: Normocephalic and atraumatic.  Eyes: Conjunctivae and EOM are normal. Pupils are equal, round, and reactive to light. Right eye exhibits no discharge. Left eye exhibits no discharge.  Neck: Neck supple. No JVD present. No tracheal deviation present. No thyromegaly present.  Cardiovascular: Normal rate, regular rhythm and normal heart sounds.  Exam reveals no gallop and no friction rub.   Pulmonary/Chest: Effort normal and breath sounds normal. No stridor. No respiratory distress. She has no wheezes. She has no rales. She exhibits no tenderness.  On deep inspiration rt lung field pain.  On short walk amublating o2 sat% drop from 99-94. Coincided with her dizziness.  Abdominal: Soft. Bowel sounds are normal. She exhibits no distension and no mass. There is no tenderness. There is no rebound and no guarding.  Musculoskeletal:  Lower ext- no pedal edema. Symmetric calfs. Negative homans signs.  Lymphadenopathy:    She has no cervical adenopathy.  Neurological: She is alert and oriented to person, place, and time. No cranial nerve deficit.  But dizzy on ambulation.  Psychiatric: She has a normal mood and affect. Her behavior is normal. Judgment and thought content normal.     .        Assessment & Plan:

## 2014-11-10 NOTE — ED Provider Notes (Signed)
CSN: [id number]     Arrival date & time 11/10/14  1219 History   First MD Initiated Contact with Patient 11/10/14 1332     Chief Complaint  Patient presents with  . Chest Pain  . Abdominal Pain     (Consider location/radiation/quality/duration/timing/severity/associated sxs/prior Treatment) HPI Comments: Patient with a history of HTN, SVT, and Pneumothorax presents today with a chief complaint of chest pain and SOB.  She reports that symptoms have been intermittent over the past 3 weeks.  She states that the chest pain is located in the right lower chest and does not radiate.  She states that she does not have any pain at rest, but does have pain with deep breaths.  Pain is not associated with exertion.  She saw her PCP for this and was instructed to come to the ED to rule out PE and Pneumothorax.  She does report associated dyspnea on exertion, but no SOB at rest.  She also reports an associated productive cough over the past week.  She denies hemoptysis.  She reports that yesterday she had a fever of 101.1 F orally.  She denies prolonged travel or surgeries in the past 4 weeks.  Denies prolonged travel or surgeries in the past 4 weeks.  Denies LE edema.  Denies history of PE or DVT.  She denies any prior cardiac history aside from the SVT.  Her PCP suspected that the pain may be related to the Gallbladder due to the location.  Therefore, she had an abdominal ultrasound to rule out Gallbladder pathology on 11/06/14, which was negative.  She denies nausea, vomiting, numbness, tingling, diaphoresis, dizziness, or syncope.    The history is provided by the patient.    Past Medical History  Diagnosis Date  . Hypertension   . Depression   . Migraine   . Irregular heart rhythm     unclear what arrhythmia  . Pneumothorax   . Torus palatinus   . Supraventricular tachycardia, paroxysmal    Past Surgical History  Procedure Laterality Date  . Cesarean section      x 2  . Abdominal hysterectomy   2005  . Pleural scarification     Family History  Problem Relation Age of Onset  . Depression Mother   . Diabetes Mother   . Heart disease Mother   . Hypertension Mother   . Hypertension Father   . Coronary artery disease Father   . Heart disease Father   . Arthritis Other   . Stroke Other   . Mental illness Other   . Heart failure Brother   . Heart disease Brother   . Hypertension Brother   . Hypertension Maternal Aunt   . Hypertension Sister   . Hypertension Brother   . Hypertension Sister    History  Substance Use Topics  . Smoking status: Never Smoker   . Smokeless tobacco: Never Used  . Alcohol Use: No   OB History    Gravida Para Term Preterm AB TAB SAB Ectopic Multiple Living   2 2             Review of Systems  All other systems reviewed and are negative.     Allergies  Review of patient's allergies indicates no known allergies.  Home Medications   Prior to Admission medications   Medication Sig Start Date End Date Taking? Authorizing Provider  Ferrous Fumarate-Folic Acid (HEMOCYTE-F) 324-1 MG TABS Take 1 tablet by mouth daily. 11/05/14   Bradd CanaryStacey A Blyth, MD  loratadine (CLARITIN) 10 MG tablet Take 1 tablet (10 mg total) by mouth as needed. 05/19/13   Sandford CrazeMelissa O'Sullivan, NP  ranitidine (ZANTAC) 150 MG tablet Take 1 tablet (150 mg total) by mouth 2 (two) times daily. 10/12/14   Sandford CrazeMelissa O'Sullivan, NP  sucralfate (CARAFATE) 1 GM/10ML suspension Take 10 mLs (1 g total) by mouth 4 (four) times daily -  with meals and at bedtime. Prn pain, dyspepsia 11/03/14   Bradd CanaryStacey A Blyth, MD   BP 142/99 mmHg  Pulse 89  Temp(Src) 98.7 F (37.1 C) (Oral)  Resp 18  Ht 5\' 10"  (1.778 m)  Wt 152 lb (68.947 kg)  BMI 21.81 kg/m2  SpO2 100% Physical Exam  Constitutional: She appears well-developed and well-nourished. No distress.  HENT:  Head: Normocephalic and atraumatic.  Mouth/Throat: Oropharynx is clear and moist.  Eyes: EOM are normal.  Neck: Normal range of motion.  Neck supple.  Cardiovascular: Normal rate, regular rhythm and normal heart sounds.   Pulses:      Dorsalis pedis pulses are 2+ on the right side, and 2+ on the left side.  Pulmonary/Chest: Effort normal and breath sounds normal. No respiratory distress. She has no wheezes. She has no rales. She exhibits no tenderness.  Abdominal: Soft. Bowel sounds are normal. She exhibits no distension and no mass. There is no tenderness. There is no rebound and no guarding.  Musculoskeletal: Normal range of motion.  No LE edema or erythema  Neurological: She is alert.  Skin: Skin is warm and dry. She is not diaphoretic.  Psychiatric: She has a normal mood and affect.  Nursing note and vitals reviewed.   ED Course  Procedures (including critical care time) Labs Review Labs Reviewed  CBC  BASIC METABOLIC PANEL  TROPONIN I  D-DIMER, QUANTITATIVE    Imaging Review No results found.   EKG Interpretation   Date/Time:  Tuesday November 10 2014 13:21:10 EST Ventricular Rate:  76 PR Interval:  166 QRS Duration: 108 QT Interval:  396 QTC Calculation: 445 R Axis:   -43 Text Interpretation:  Sinus rhythm with occasional Premature ventricular  complexes Possible Left atrial enlargement Left axis deviation Incomplete  right bundle branch block Left ventricular hypertrophy Cannot rule out  Septal infarct , age undetermined T wave abnormality, consider lateral  ischemia similar compared to prior 02/07/14 Abnormal ECG Confirmed by Lincoln Brighamees,  Liz [phone]([id number]) on 11/10/2014 1:25:35 PM     4:00 PM Reassessed patient.  No acute distress.  She was able to ambulate in the ED with pulse ox 98 and above.  She denies chest pain at this time.   MDM   Final diagnoses:  Chest pain   Patient presents today with a complaints of chest pain, SOB, cough, and fever.  Patient afebrile in the ED and non toxic appearing.  VSS.  Labs today unremarkable aside from mild anemia with hemoglobin of 10.5.  No ischemic changes on EKG.   Troponin negative.  D-dimer was found to be elevated.  Therefore, CT angio chest was ordered to rule out a PE.  CT angio negative for PE, but did show a Pneumonia.  Patient currently lives at home and has not had any hospitalizations over the past 90 days.  Therefore, patient treated for CAP with Ceftriaxone and Azithromycin.  She is not hypoxic.  Pulse ox 100 on RA.  She maintained a pulse ox of 98 and above with ambulation.  She is tolerating PO liquids.  Therefore, feel that the patient can be treated  for the Pneumonia outpatient.  Patient in agreement with the plan.  Strict return precautions given.  Patient also evaluated by Dr. Madilyn Hook who is in agreement with the plan.    Santiago Glad, PA-C 11/12/14 2049  Tilden Fossa, MD 11/14/14 1459

## 2014-11-10 NOTE — ED Notes (Signed)
Pt states she has been having pain at Right upper abdominal area, pain also in right ribs to back.  Some sob with lying flat, deep breaths and on exertion.  Pt states provider sent her down stairs to be checked for possible PE and pneumo.

## 2014-11-10 NOTE — Progress Notes (Signed)
Pre visit review using our clinic review tool, if applicable. No additional management support is needed unless otherwise documented below in the visit note. 

## 2014-11-10 NOTE — Patient Instructions (Addendum)
With your history of rt side pneumothorax, recent rt sided chest pain, 02 sat drop from 99%-94% on short walk and dizziness on ambulating. I do want you evaluated by the emergency department. I called downstairs and notify charge nurse of your recent presentation.  I took pt downstairs in wheel chair.

## 2014-11-10 NOTE — Assessment & Plan Note (Signed)
With your history of rt side pneumothorax, recent rt sided chest pain, 02 sat drop from 99%-94% on short walk and dizziness on ambulating. I do want you evaluated by the emergency department. I called downstairs and notify charge nurse of your recent presentation.

## 2014-11-10 NOTE — ED Notes (Signed)
Pt had RUQ x2 weeks that has been intermittent. She sts the pain is better but she is now becoming SOB with exertion. Pt sent for eval by PCP due to hx of pneumothorax.

## 2014-11-11 NOTE — Telephone Encounter (Signed)
Appointment scheduled.

## 2014-11-12 ENCOUNTER — Ambulatory Visit: Payer: BC Managed Care – PPO | Admitting: Physician Assistant

## 2014-11-18 ENCOUNTER — Ambulatory Visit (HOSPITAL_BASED_OUTPATIENT_CLINIC_OR_DEPARTMENT_OTHER)
Admission: RE | Admit: 2014-11-18 | Discharge: 2014-11-18 | Disposition: A | Discharge status: Home / Self Care | Payer: BC Managed Care – PPO | Source: Ambulatory Visit | Attending: Medical | Admitting: Medical

## 2014-11-18 ENCOUNTER — Ambulatory Visit (INDEPENDENT_AMBULATORY_CARE_PROVIDER_SITE_OTHER): Payer: BC Managed Care – PPO | Admitting: Medical

## 2014-11-18 ENCOUNTER — Encounter: Payer: Self-pay | Admitting: Medical

## 2014-11-18 ENCOUNTER — Telehealth: Payer: Self-pay | Admitting: Medical

## 2014-11-18 VITALS — BP 120/86 | HR 68 | Temp 98.1°F | Ht 70.0 in | Wt 154.4 lb

## 2014-11-18 DIAGNOSIS — R918 Other nonspecific abnormal finding of lung field: Secondary | ICD-10-CM

## 2014-11-18 DIAGNOSIS — Z8709 Personal history of other diseases of the respiratory system: Secondary | ICD-10-CM

## 2014-11-18 DIAGNOSIS — I517 Cardiomegaly: Secondary | ICD-10-CM | POA: Diagnosis not present

## 2014-11-18 DIAGNOSIS — J984 Other disorders of lung: Secondary | ICD-10-CM | POA: Insufficient documentation

## 2014-11-18 DIAGNOSIS — J189 Pneumonia, unspecified organism: Secondary | ICD-10-CM

## 2014-11-18 DIAGNOSIS — J989 Respiratory disorder, unspecified: Secondary | ICD-10-CM | POA: Insufficient documentation

## 2014-11-18 DIAGNOSIS — R0989 Other specified symptoms and signs involving the circulatory and respiratory systems: Secondary | ICD-10-CM

## 2014-11-18 MED ORDER — ALBUTEROL SULFATE HFA 108 (90 BASE) MCG/ACT IN AERS: 2.0000 | INHALATION_SPRAY | Freq: Four times a day (QID) | RESPIRATORY_TRACT | Status: DC | PRN

## 2014-11-18 MED ORDER — BECLOMETHASONE DIPROPIONATE 40 MCG/ACT IN AERS: 2.0000 | INHALATION_SPRAY | Freq: Two times a day (BID) | RESPIRATORY_TRACT | Status: DC

## 2014-11-18 MED ORDER — CLARITHROMYCIN ER 500 MG PO TB24: 1000.0000 mg | ORAL_TABLET | Freq: Every day | ORAL | Status: DC

## 2014-11-18 NOTE — Assessment & Plan Note (Signed)
Based on you lung history and recent symptoms, I am going to refer you to pulmonologist.

## 2014-11-18 NOTE — Progress Notes (Signed)
Subjective:    Patient ID: Renee BoardsCynthia Snavely-Sharpe, female    DOB: 22-Jul-1961, 53 y.o.   MRN: [id number]  HPI   Pt in stating that overall she feels better. Off and on she has less shortness of breath. Today feels no shortness of breath. Some pain on movement of thorax rt rib area.  Pt had some subjective  fever. Highest temp yesterday was 99.1 yesterday. Pt had opacity in rt upper lobe consistent with pneumonia on ct of chest. No PE and no pneumothorax on that xray. Pt got iv antibiotic in the ED(Last visit sent down to ED). Then took remaining 4 days of zithromax oral tabs. Finished this on Saturday. Pt still feels a little tired. Subjective fever and occasional cough. Winded going up stairs. Trasnient rt lower rib pain on coughing or deep breathing that last on second.  Pt taking ibuprofen for rt lower rib pain. This does ease the pain.  Pt has no history of smoking ever in the past. No second hand smoke. Uncles who were smokers had lung.   Occasional dry cough. But no productive mucous.  Remote history of rt lung pneumothorax for more than 5 yrs ago. None since the surgery in the past.  Past Medical History  Diagnosis Date  . Hypertension   . Depression   . Migraine   . Irregular heart rhythm     unclear what arrhythmia  . Pneumothorax   . Torus palatinus   . Supraventricular tachycardia, paroxysmal     History   Social History  . Marital Status: Legally Separated    Spouse Name: N/A    Number of Children: 2  . Years of Education: N/A   Occupational History  . SALES    Social History Main Topics  . Smoking status: Never Smoker   . Smokeless tobacco: Never Used  . Alcohol Use: No  . Drug Use: No  . Sexual Activity: Yes   Other Topics Concern  . Not on file   Social History Narrative    Past Surgical History  Procedure Laterality Date  . Cesarean section      x 2  . Abdominal hysterectomy  2005  . Pleural scarification      Family History  Problem  Relation Age of Onset  . Depression Mother   . Diabetes Mother   . Heart disease Mother   . Hypertension Mother   . Hypertension Father   . Coronary artery disease Father   . Heart disease Father   . Arthritis Other   . Stroke Other   . Mental illness Other   . Heart failure Brother   . Heart disease Brother   . Hypertension Brother   . Hypertension Maternal Aunt   . Hypertension Sister   . Hypertension Brother   . Hypertension Sister     No Known Allergies  Current Outpatient Prescriptions on File Prior to Visit  Medication Sig Dispense Refill  . Ferrous Fumarate-Folic Acid (HEMOCYTE-F) 324-1 MG TABS Take 1 tablet by mouth daily. 30 each 2  . loratadine (CLARITIN) 10 MG tablet Take 1 tablet (10 mg total) by mouth as needed. 30 tablet 2  . ranitidine (ZANTAC) 150 MG tablet Take 1 tablet (150 mg total) by mouth 2 (two) times daily. 60 tablet 0  . sucralfate (CARAFATE) 1 GM/10ML suspension Take 10 mLs (1 g total) by mouth 4 (four) times daily -  with meals and at bedtime. Prn pain, dyspepsia 420 mL 1   No current  facility-administered medications on file prior to visit.    BP 120/86 mmHg  Pulse 68  Temp(Src) 98.1 F (36.7 C) (Oral)  Ht 5\' 10"  (1.778 m)  Wt 154 lb 6.4 oz (70.035 kg)  BMI 22.15 kg/m2  SpO2 97%          Review of Systems  Constitutional: Positive for fever. Negative for chills and fatigue.       Subjective fever.  HENT: Negative for drooling, ear discharge, hearing loss, nosebleeds, postnasal drip, rhinorrhea, sinus pressure, sore throat and tinnitus.   Respiratory: Positive for cough and shortness of breath. Negative for wheezing.        See hpi.  Cardiovascular: Negative for chest pain and palpitations.       No midsternal or lt side. Some transient one second rib pain when moves or coughs.  Musculoskeletal: Negative for back pain.  Neurological: Negative for tremors, seizures, syncope, facial asymmetry, speech difficulty, weakness,  light-headedness, numbness and headaches.       Rare transient dizziness at times. None on exam.  Hematological: Negative for adenopathy. Does not bruise/bleed easily.       Objective:   Physical Exam   General  Mental Status - Alert. General Appearance - Well groomed. Not in acute distress.  Skin Rashes- No Rashes.  HEENT Head- Normal. Ear Auditory Canal - Left- Normal. Right - Normal.Tympanic Membrane- Left- Normal. Right- Normal. Eye Sclera/Conjunctiva- Left- Normal. Right- Normal. Nose & Sinuses Nasal Mucosa- Left-  Not boggy or Congested. Right-  Not  boggy or Congested. Mouth & Throat Lips: Upper Lip- Normal: no dryness, cracking, pallor, cyanosis, or vesicular eruption. Lower Lip-Normal: no dryness, cracking, pallor, cyanosis or vesicular eruption. Buccal Mucosa- Bilateral- No Aphthous ulcers. Oropharynx- No Discharge or Erythema. Tonsils: Characteristics- Bilateral- No Erythema or Congestion. Size/Enlargement- Bilateral- No enlargement. Discharge- bilateral-None.  Neck Neck- Supple. No Masses. No tracheal deviation.   Chest and Lung Exam Auscultation: Breath Sounds:- even and unlabored/clear.  Cardiovascular Auscultation:Rythm- Regular, rate and rhythm. Murmurs & Other Heart Sounds:Ausculatation of the heart reveal- No Murmurs.  Lymphatic Head & Neck General Head & Neck Lymphatics: Bilateral: Description- No Localized lymphadenopathy.  Lower ext- no pedal edema. Negative homans signs.  On lying supine         Assessment & Plan:

## 2014-11-18 NOTE — Assessment & Plan Note (Signed)
Will put CT of chest order to be done in 6 months for pulmonary nodules. Our staff will call for that appointment date.

## 2014-11-18 NOTE — Telephone Encounter (Signed)
Repeat cxr in 10 days to follow rt upper lobe infiltrate vs scarring.

## 2014-11-18 NOTE — Assessment & Plan Note (Signed)
Your shortness of breath climbing stairs may represent some reactive airway disease along side the pneumonia. So I am prescribing qvar and altubuterol.

## 2014-11-18 NOTE — Assessment & Plan Note (Signed)
You may have some residual pneumonia. I am prescribing biaxin xl, and getting repeat cxr today.  Will assess rt upper lung area where prior infection on ct was seen and assess if any pneumothorax due to your history.

## 2014-11-18 NOTE — Patient Instructions (Addendum)
You may have some residual pneumonia. I am prescribing biaxin xl, and getting repeat cxr today.  Will assess rt upper lung area where prior infection on ct was seen and assess if any pneumothorax due to your history.  Your shortness of breath climbing stairs may represent some reactive airway disease along side the pneumonia. So I am prescribing qvar and altubuterol.  Based on you lung history and recent symptoms, I am going to refer you to pulmonologist.  Return to work tomorrow. If unable to tolerate then let us know.   Follow up in 2 wks with pcp(or myself) or as needed.  Will put CT of chest order to be done in 6 months for pulmonary nodules. Our staff will call for that appointment date.  I acute severe or worsening pulmonary or cardiac symptoms then ED eval.

## 2014-11-18 NOTE — Progress Notes (Signed)
Pre visit review using our clinic review tool, if applicable. No additional management support is needed unless otherwise documented below in the visit note. 

## 2014-11-19 NOTE — Telephone Encounter (Signed)
Patient returned phone call wanting to know why she has to have a repeat Xray. Best # [phone]

## 2014-11-19 NOTE — Telephone Encounter (Signed)
Explained to patient the need for repeat CXR. Patient understands/agreed.

## 2014-11-19 NOTE — Telephone Encounter (Signed)
Left message reminding patient to have XR done in 10 days.

## 2014-11-23 ENCOUNTER — Encounter: Payer: Self-pay | Admitting: *Deleted

## 2014-11-24 ENCOUNTER — Encounter (INDEPENDENT_AMBULATORY_CARE_PROVIDER_SITE_OTHER): Payer: Self-pay

## 2014-11-24 ENCOUNTER — Ambulatory Visit: Payer: BC Managed Care – PPO | Admitting: Nurse Practitioner

## 2014-11-24 ENCOUNTER — Encounter: Payer: Self-pay | Admitting: Internal Medicine

## 2014-11-24 ENCOUNTER — Ambulatory Visit (INDEPENDENT_AMBULATORY_CARE_PROVIDER_SITE_OTHER): Payer: BC Managed Care – PPO | Admitting: Internal Medicine

## 2014-11-24 VITALS — BP 128/78 | HR 79 | Temp 98.8°F | Ht 70.0 in | Wt 152.8 lb

## 2014-11-24 DIAGNOSIS — R0789 Other chest pain: Secondary | ICD-10-CM

## 2014-11-24 DIAGNOSIS — Z8709 Personal history of other diseases of the respiratory system: Secondary | ICD-10-CM

## 2014-11-24 DIAGNOSIS — J189 Pneumonia, unspecified organism: Secondary | ICD-10-CM

## 2014-11-24 DIAGNOSIS — Z23 Encounter for immunization: Secondary | ICD-10-CM

## 2014-11-24 NOTE — Assessment & Plan Note (Addendum)
Very difficult to sort out the various components of her chest and abd pains by her hx or her CT of chest which does not explain her symptoms but since she's convinced they are 98% better p motrin and rx with macrolides the only question I would have is what to do if they recur and offered to see the patient back at any time should this occur.  In meantime rec continue zantac, add ppi if still having atypical midline cp/ diet/ and f/u with gi as planned

## 2014-11-24 NOTE — Assessment & Plan Note (Addendum)
No evidence of recurrence though ? Whether has or ever  had sign thoracic endometriosis ? Whether that's even relevant if she's post-menopausal now, as she assures me is the case (would check fsh to confirm)   Pulmonary f/u is prn

## 2014-11-24 NOTE — Assessment & Plan Note (Signed)
No evidence this has anything directly to do with the previous ptx or surgery for ptx and though the hx was extremely difficult to obtained (and constantly changed) I think her cap has been treated adequately and would not repeat ct's unless the pattern of pain changes significantly (to new areas)  I did rec prevnar today and f/u p holidays if not back to baseline

## 2014-11-24 NOTE — Progress Notes (Signed)
Subjective:    Patient ID: Renee BoardsCynthia Wolfe, female    DOB: 08/25/1961,  MRN: [id number]  HPI   [age] yobf never smoker with R PTx s/p surgery in 2003 with intermittent issues "stomach pains" dx as H Pylori then more indigestion/ cp starting March 2015 improved on rx as long as continued zantac then intermttent dysphagia/ worse cp starting around last week in Nov 2015  and eventually dx'd with ? Pneumonia by CT chest and referred to pulmonary clinic 11/24/2014  by Dr [name]    11/24/2014 1st [place] [facility] office visit/ [name]   Chief Complaint  Patient presents with  . Pulmonary Consult    Referred by Dr. Alvira Wolfe.  Pt c/o right side CP for the past month.  She was dxed with having PNA and placed on multiple abx. She states that she is feeling some better, but still has occ chest discomfort.  98% improved ? From motrin / first treated with zpak then biaxin, extremely variable hx taken by multiple providers Midline and right lower cp both Worse lying down  Pain just to right of sternum and under the r breast Temp 100.1 never shaking chllls, "only sweat was today"  Never coughed / no excess or purulent  mucus   No obvious other patterns in day to day or daytime variabilty or assoc chronic cough or cp or chest tightness, subjective wheeze overt sinus or hb symptoms. No unusual exp hx or h/o childhood pna/ asthma or knowledge of premature birth.  Sleeping ok without nocturnal  or early am exacerbation  of respiratory  c/o's or need for noct saba. Also denies any obvious fluctuation of symptoms with weather or environmental changes or other aggravating or alleviating factors except as outlined above   Current Medications, Allergies, Complete Past Medical History, Past Surgical History, Family History, and Social History were reviewed in [facility] electronic medical record.               Review of Systems  Constitutional: Negative for fever, chills and unexpected weight  change.  HENT: Negative for congestion, dental problem, ear pain, nosebleeds, postnasal drip, rhinorrhea, sinus pressure, sneezing, sore throat, trouble swallowing and voice change.   Eyes: Negative for visual disturbance.  Respiratory: Negative for cough, choking and shortness of breath.   Cardiovascular: Positive for chest pain. Negative for leg swelling.  Gastrointestinal: Negative for vomiting, abdominal pain and diarrhea.  Genitourinary: Negative for difficulty urinating.  Musculoskeletal: Negative for arthralgias.  Skin: Negative for rash.  Neurological: Negative for tremors, syncope and headaches.  Hematological: Does not bruise/bleed easily.       Objective:   Physical Exam Pleasant but quit anxious amb bf nad who  failed to answer   questions asked in a straightforward manner, tending to go off on tangents or answer questions with ambiguous medical terms or diagnoses and seemed somewhat aggravated  when asked the same question more than once for clarification.   Wt Readings from Last 3 Encounters:  11/24/14 152 lb 12.8 oz (69.31 kg)  11/18/14 154 lb 6.4 oz (70.035 kg)  11/10/14 152 lb (68.947 kg)    Vital signs reviewed  HEENT: nl dentition, turbinates, and orophanx. Nl external ear canals without cough reflex   NECK :  without JVD/Nodes/TM/ nl carotid upstrokes bilaterally   LUNGS: no acc muscle use, clear to A and P bilaterally without cough on insp or exp maneuvers   CV:  RRR  no s3 or murmur or increase in P2, no edema  ABD:  soft and nontender with nl excursion in the supine position. No bruits or organomegaly, bowel sounds nl  MS:  warm without deformities, calf tenderness, cyanosis or clubbing  SKIN: warm and dry without lesions    NEURO:  alert, approp, no deficits    cxr 11/10/14 with def change post RUL correlating with findings on ct same date Nothing at all seen anterior, medially, or anywhere near the area below the R breast            Assessment & Plan:

## 2014-11-24 NOTE — Patient Instructions (Signed)
Prevnar 13 today   Finish the antibiotics  If not 100% better, return after holidays   GERD (REFLUX)  is an extremely common cause of respiratory symptoms just like yours , many times with no obvious heartburn at all.    It can be treated with medication, but also with lifestyle changes including avoidance of late meals, excessive alcohol, smoking cessation, and avoid fatty foods, chocolate, peppermint, colas, red wine, and acidic juices such as orange juice.  NO MINT OR MENTHOL PRODUCTS SO NO COUGH DROPS  USE SUGARLESS CANDY INSTEAD (Jolley ranchers or Stover's or Life Savers) or even ice chips will also do - the key is to swallow to prevent all throat clearing. NO OIL BASED VITAMINS - use powdered substitutes.  Zantac after bfast and at bedtime > continue but if any difficulty with swallowing or indigestion change to prilosec otc 20 mg Take 30-60 min before first meal of the day and continue the zantac at bedtime

## 2014-12-14 ENCOUNTER — Ambulatory Visit (HOSPITAL_BASED_OUTPATIENT_CLINIC_OR_DEPARTMENT_OTHER)
Admission: RE | Admit: 2014-12-14 | Discharge: 2014-12-14 | Disposition: A | Discharge status: Home / Self Care | Payer: BC Managed Care – PPO | Source: Ambulatory Visit | Attending: Medical | Admitting: Medical

## 2014-12-14 DIAGNOSIS — M4854XA Collapsed vertebra, not elsewhere classified, thoracic region, initial encounter for fracture: Secondary | ICD-10-CM | POA: Insufficient documentation

## 2014-12-14 DIAGNOSIS — M8580 Other specified disorders of bone density and structure, unspecified site: Secondary | ICD-10-CM | POA: Insufficient documentation

## 2014-12-14 DIAGNOSIS — R918 Other nonspecific abnormal finding of lung field: Secondary | ICD-10-CM | POA: Diagnosis present

## 2014-12-14 DIAGNOSIS — R05 Cough: Secondary | ICD-10-CM | POA: Insufficient documentation

## 2014-12-14 DIAGNOSIS — I517 Cardiomegaly: Secondary | ICD-10-CM | POA: Insufficient documentation

## 2014-12-14 DIAGNOSIS — M4184 Other forms of scoliosis, thoracic region: Secondary | ICD-10-CM | POA: Insufficient documentation

## 2014-12-15 ENCOUNTER — Encounter (HOSPITAL_BASED_OUTPATIENT_CLINIC_OR_DEPARTMENT_OTHER): Payer: Self-pay

## 2014-12-15 ENCOUNTER — Ambulatory Visit (HOSPITAL_BASED_OUTPATIENT_CLINIC_OR_DEPARTMENT_OTHER)
Admission: RE | Admit: 2014-12-15 | Discharge: 2014-12-15 | Disposition: A | Discharge status: Home / Self Care | Payer: BC Managed Care – PPO | Source: Ambulatory Visit | Attending: Physician Assistant | Admitting: Physician Assistant

## 2014-12-15 ENCOUNTER — Encounter: Payer: Self-pay | Admitting: Physician Assistant

## 2014-12-15 ENCOUNTER — Ambulatory Visit (INDEPENDENT_AMBULATORY_CARE_PROVIDER_SITE_OTHER): Payer: BC Managed Care – PPO | Admitting: Physician Assistant

## 2014-12-15 ENCOUNTER — Telehealth: Payer: Self-pay | Admitting: Family

## 2014-12-15 ENCOUNTER — Other Ambulatory Visit: Payer: Self-pay | Admitting: Physician Assistant

## 2014-12-15 VITALS — BP 120/74 | HR 81 | Temp 98.3°F | Resp 16 | Ht 70.0 in | Wt 145.4 lb

## 2014-12-15 DIAGNOSIS — K2970 Gastritis, unspecified, without bleeding: Secondary | ICD-10-CM

## 2014-12-15 DIAGNOSIS — G8929 Other chronic pain: Secondary | ICD-10-CM

## 2014-12-15 DIAGNOSIS — R109 Unspecified abdominal pain: Secondary | ICD-10-CM

## 2014-12-15 DIAGNOSIS — R1031 Right lower quadrant pain: Secondary | ICD-10-CM | POA: Diagnosis not present

## 2014-12-15 DIAGNOSIS — K219 Gastro-esophageal reflux disease without esophagitis: Secondary | ICD-10-CM | POA: Diagnosis not present

## 2014-12-15 DIAGNOSIS — K2990 Gastroduodenitis, unspecified, without bleeding: Secondary | ICD-10-CM

## 2014-12-15 LAB — COMPREHENSIVE METABOLIC PANEL
ALK PHOS: 72 U/L (ref 39–117)
ALT: 13 U/L (ref 0–35)
AST: 15 U/L (ref 0–37)
Albumin: 3.5 g/dL (ref 3.5–5.2)
BUN: 9 mg/dL (ref 6–23)
CO2: 26 mEq/L (ref 19–32)
Calcium: 8.8 mg/dL (ref 8.4–10.5)
Chloride: 108 mEq/L (ref 96–112)
Creatinine, Ser: 0.6 mg/dL (ref 0.4–1.2)
GFR: 133.09 mL/min (ref >60.00)
GLUCOSE: 89 mg/dL (ref 70–99)
Potassium: 3.6 mEq/L (ref 3.5–5.1)
SODIUM: 139 meq/L (ref 135–145)
Total Bilirubin: 0.5 mg/dL (ref 0.2–1.2)
Total Protein: 7.5 g/dL (ref 6.0–8.3)

## 2014-12-15 LAB — CBC
HEMATOCRIT: 31.6 % — AB (ref 36.0–46.0)
HEMOGLOBIN: 9.9 g/dL — AB (ref 12.0–15.0)
MCHC: 31.4 g/dL (ref 30.0–36.0)
MCV: 71.3 fl — AB (ref 78.0–100.0)
Platelets: 416 10*3/uL — ABNORMAL HIGH (ref 150.0–400.0)
RBC: 4.44 Mil/uL (ref 3.87–5.11)
RDW: 16.1 % — AB (ref 11.5–15.5)
WBC: 6.9 10*3/uL (ref 4.0–10.5)

## 2014-12-15 MED ORDER — OMEPRAZOLE 20 MG PO CPDR: 20.0000 mg | DELAYED_RELEASE_CAPSULE | Freq: Two times a day (BID) | ORAL | Status: DC

## 2014-12-15 MED ORDER — HYDROCORTISONE ACETATE 25 MG RE SUPP: 25.0000 mg | Freq: Two times a day (BID) | RECTAL | Status: DC

## 2014-12-15 MED ORDER — IOHEXOL 300 MG/ML  SOLN
100.0000 mL | Freq: Once | INTRAMUSCULAR | Status: AC | PRN
  Administered 2014-12-15: 100 mL via INTRAVENOUS

## 2014-12-15 NOTE — Patient Instructions (Signed)
Please stop by the lab for blood work. I will call you with your results.  Please take Prilosec twice daily as directed.  Continue Carafate.  Read diet below.  You will be contacted for a CT scan and to see a GI specialist.  If anything acutely worsens,please proceed to the ER.  Food Choices for Peptic Ulcer Disease When you have peptic ulcer disease, the foods you eat and your eating habits are very important. Choosing the right foods can help ease the discomfort of peptic ulcer disease. WHAT GENERAL GUIDELINES DO I NEED TO FOLLOW?  Choose fruits, vegetables, whole grains, and low-fat meat, fish, and poultry.   Keep a food diary to identify foods that cause symptoms.  Avoid foods that cause irritation or pain. These may be different for different people.  Eat frequent small meals instead of three large meals each day. The pain may be worse when your stomach is empty.  Avoid eating close to bedtime. WHAT FOODS ARE NOT RECOMMENDED? The following are some foods and drinks that may worsen your symptoms:  Black, white, and red pepper.  Hot sauce.  Chili peppers.  Chili powder.  Chocolate and cocoa.   Alcohol.  Tea, coffee, and cola (regular and decaffeinated). The items listed above may not be a complete list of foods and beverages to avoid. Contact your dietitian for more information. Document Released: 02/12/2012 Document Revised: 11/25/2013 Document Reviewed: 09/24/2013 [facility] Patient Information 2015 [employer], [place]. This information is not intended to replace advice given to you by your health care provider. Make sure you discuss any questions you have with your health care provider.

## 2014-12-15 NOTE — Telephone Encounter (Signed)
[facility] hem[facility]rid medication, pt states she received omeprazole (PRILOSEC) 20 MG capsule. Please advise

## 2014-12-15 NOTE — Progress Notes (Signed)
Pre visit review using our clinic review tool, if applicable. No additional management support is needed unless otherwise documented below in the visit note/SLS  

## 2014-12-15 NOTE — Telephone Encounter (Signed)
Done. Thank you.

## 2014-12-15 NOTE — Telephone Encounter (Signed)
Patient informed, understood; has p/u medication/SLS

## 2014-12-16 ENCOUNTER — Telehealth: Payer: Self-pay | Admitting: Physician Assistant

## 2014-12-16 ENCOUNTER — Telehealth: Payer: Self-pay | Admitting: Gastroenterology

## 2014-12-16 DIAGNOSIS — D500 Iron deficiency anemia secondary to blood loss (chronic): Secondary | ICD-10-CM

## 2014-12-16 DIAGNOSIS — K2970 Gastritis, unspecified, without bleeding: Secondary | ICD-10-CM

## 2014-12-16 DIAGNOSIS — K2990 Gastroduodenitis, unspecified, without bleeding: Principal | ICD-10-CM

## 2014-12-16 NOTE — Telephone Encounter (Signed)
Patient informed, understood & agreed/SLS  

## 2014-12-16 NOTE — Telephone Encounter (Signed)
LMOM with contact name and number for return call RE: results and further provider instructions/SLS  

## 2014-12-16 NOTE — Telephone Encounter (Signed)
Labs reveal anemia which is chronic for her but slightly worsened.  There is concern for slow bleeding secondary to ulcer.  I want to set her up with a GI specialist for further evaluation.  Her CT scan shows a good amount of stool which can explain part of her discomfort but is unlikely the sole source.  It also shows a possible cyst on her mesentery (overlying fat of intestines).  Needs further evaluation, but I want a GI specialist to take a look first.  Continue care discussed at visit.

## 2014-12-16 NOTE — Telephone Encounter (Signed)
Left message on machine to call back  

## 2014-12-17 ENCOUNTER — Other Ambulatory Visit (INDEPENDENT_AMBULATORY_CARE_PROVIDER_SITE_OTHER): Payer: BC Managed Care – PPO

## 2014-12-17 ENCOUNTER — Ambulatory Visit (INDEPENDENT_AMBULATORY_CARE_PROVIDER_SITE_OTHER): Payer: BC Managed Care – PPO | Admitting: Nurse Practitioner

## 2014-12-17 ENCOUNTER — Encounter: Payer: Self-pay | Admitting: Nurse Practitioner

## 2014-12-17 VITALS — BP 118/80 | HR 104 | Ht 69.75 in | Wt 150.1 lb

## 2014-12-17 DIAGNOSIS — R1033 Periumbilical pain: Secondary | ICD-10-CM

## 2014-12-17 DIAGNOSIS — R1010 Upper abdominal pain, unspecified: Secondary | ICD-10-CM

## 2014-12-17 DIAGNOSIS — R938 Abnormal findings on diagnostic imaging of other specified body structures: Secondary | ICD-10-CM

## 2014-12-17 DIAGNOSIS — K2990 Gastroduodenitis, unspecified, without bleeding: Principal | ICD-10-CM

## 2014-12-17 DIAGNOSIS — R634 Abnormal weight loss: Secondary | ICD-10-CM

## 2014-12-17 DIAGNOSIS — R9389 Abnormal findings on diagnostic imaging of other specified body structures: Secondary | ICD-10-CM | POA: Insufficient documentation

## 2014-12-17 DIAGNOSIS — K2970 Gastritis, unspecified, without bleeding: Secondary | ICD-10-CM | POA: Insufficient documentation

## 2014-12-17 DIAGNOSIS — R109 Unspecified abdominal pain: Secondary | ICD-10-CM | POA: Insufficient documentation

## 2014-12-17 LAB — IGA: IGA: 195 mg/dL (ref 68–378)

## 2014-12-17 MED ORDER — MOVIPREP 100 G PO SOLR: 1.0000 | ORAL | Status: DC

## 2014-12-17 MED ORDER — CYCLOBENZAPRINE HCL 10 MG PO TABS: ORAL_TABLET | ORAL | Status: DC

## 2014-12-17 NOTE — Progress Notes (Signed)
Patient presents to clinic today c/o abdominal pain that has been present for  > 1 month but has been worsening x 4 days.  Endorses pain is diffuse but is worse in the upper abdomen.  Denies trauma or injury.  Denies nausea, vomiting, fever, chills, melena, hematochezia or tenesmus.  Does endorse constipation.  Was recently seen for similar symptoms and diagnosed with gastritis.  Workup at that time unremarkable.  Was told to take Zantac BID.  Denies improvement in symptoms.    Past Medical History  Diagnosis Date  . Hypertension   . Depression   . Migraine   . Irregular heart rhythm     unclear what arrhythmia  . Pneumothorax     pneumonia  . Torus palatinus   . Supraventricular tachycardia, paroxysmal   . Anxiety   . GERD (gastroesophageal reflux disease)   . Anemia   . SVT (supraventricular tachycardia)   . H. pylori infection     Current Outpatient Prescriptions on File Prior to Visit  Medication Sig Dispense Refill  . albuterol (PROVENTIL HFA;VENTOLIN HFA) 108 (90 BASE) MCG/ACT inhaler Inhale 2 puffs into the lungs every 6 (six) hours as needed for wheezing or shortness of breath. 1 Inhaler 0  . beclomethasone (QVAR) 40 MCG/ACT inhaler Inhale 2 puffs into the lungs 2 (two) times daily at 10 AM and 5 PM. 1 Inhaler 2  . Ferrous Fumarate-Folic Acid (HEMOCYTE-F) 324-1 MG TABS Take 1 tablet by mouth daily. 30 each 2  . loratadine (CLARITIN) 10 MG tablet Take 1 tablet (10 mg total) by mouth as needed. (Patient not taking: Reported on 12/17/2014) 30 tablet 2  . sucralfate (CARAFATE) 1 GM/10ML suspension Take 10 mLs (1 g total) by mouth 4 (four) times daily -  with meals and at bedtime. Prn pain, dyspepsia 420 mL 1   No current facility-administered medications on file prior to visit.    No Known Allergies  Family History  Problem Relation Age of Onset  . Depression Mother   . Diabetes Mother   . Heart disease Mother   . Hypertension Mother   . Hypertension Father   .  Coronary artery disease Father   . Heart disease Father   . Arthritis Mother   . Mental illness Mother   . Heart failure Brother   . Heart disease Brother   . Hypertension Brother     x2  . Hypertension Sister     History   Social History  . Marital Status: Legally Separated    Spouse Name: N/A    Number of Children: 2  . 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 Smoking status: Never Smoker   . Smokeless tobacco: Never Used  . Alcohol Use: No  . Drug Use: No  . Sexual Activity: Yes   Other Topics Concern  . None   Social History Narrative    Review of Systems - See HPI.  All other ROS are negative.  BP 120/74 mmHg  Pulse 81  Temp(Src) 98.3 F (36.8 C) (Oral)  Resp 16  Ht 5' 10"  (1.778 m)  Wt 145 lb 6 oz (65.942 kg)  BMI 20.86 kg/m2  SpO2 100%  Physical Exam  Constitutional: She is oriented to person, place, and time and well-developed, well-nourished, and in no distress.  HENT:  Head: Normocephalic and atraumatic.  Eyes: Conjunctivae are normal.  Neck: Neck supple.  Cardiovascular:  Normal rate, regular rhythm, normal heart sounds and intact distal pulses.   Pulmonary/Chest: Effort normal and breath sounds normal. No respiratory distress. She has no wheezes. She has no rales. She exhibits no tenderness.  Abdominal: Soft. Bowel sounds are normal. She exhibits no distension and no mass. There is no hepatosplenomegaly. There is generalized tenderness. There is no rebound, no guarding and no CVA tenderness.  Neurological: She is alert and oriented to person, place, and time.  Skin: Skin is warm. No rash noted.  Psychiatric: Affect normal.  Vitals reviewed.   Recent Results (from the past 2160 hour(s))  CBC     Status: Abnormal   Collection Time: 11/03/14  6:27 PM  Result Value Ref Range   WBC 8.5 4.0 - 10.5 K/uL   RBC 4.64 3.87 - 5.11 Mil/uL   Platelets 338.0 150.0 - 400.0 K/uL    Hemoglobin 10.9 (L) 12.0 - 15.0 g/dL   HCT 34.0 (L) 36.0 - 46.0 %   MCV 73.3 (L) 78.0 - 100.0 fl   MCHC 32.1 30.0 - 36.0 g/dL   RDW 16.0 (H) 11.5 - 15.5 %  Lipase     Status: None   Collection Time: 11/03/14  6:27 PM  Result Value Ref Range   Lipase 46.0 11.0 - 59.0 U/L  Hepatic function panel     Status: None   Collection Time: 11/03/14  6:27 PM  Result Value Ref Range   Total Bilirubin 0.5 0.2 - 1.2 mg/dL   Bilirubin, Direct 0.1 0.0 - 0.3 mg/dL   Alkaline Phosphatase 79 39 - 117 U/L   AST 18 0 - 37 U/L   ALT 19 0 - 35 U/L   Total Protein 7.2 6.0 - 8.3 g/dL   Albumin 4.0 3.5 - 5.2 g/dL  Renal function panel     Status: None   Collection Time: 11/03/14  6:27 PM  Result Value Ref Range   Sodium 137 135 - 145 mEq/L   Potassium 4.2 3.5 - 5.1 mEq/L   Chloride 102 96 - 112 mEq/L   CO2 28 19 - 32 mEq/L   Calcium 9.0 8.4 - 10.5 mg/dL   Albumin 4.0 3.5 - 5.2 g/dL   BUN 13 6 - 23 mg/dL   Creatinine, Ser 0.7 0.4 - 1.2 mg/dL   Glucose, Bld 89 70 - 99 mg/dL   Phosphorus 4.1 2.3 - 4.6 mg/dL   GFR 121.40 >60.00 mL/min  Sedimentation rate     Status: Abnormal   Collection Time: 11/03/14  6:27 PM  Result Value Ref Range   Sed Rate 51 (H) 0 - 22 mm/hr  H. pylori breath test     Status: None   Collection Time: 11/04/14  4:31 PM  Result Value Ref Range   H. pylori Breath Test NOT DETECTED Not Detected    Comment:   Antimicrobials, proton pump inhibitors, and bismuth preparations are known to suppress H. pylori, and ingestion of these prior to H. pylori diagnostic testing may lead to false negative results. If clinically indicated, the test may be repeated on a new specimen obtained two weeks after discontinuing treatment.     CBC     Status: Abnormal   Collection Time: 11/10/14 12:45 PM  Result Value Ref Range   WBC 8.1 4.0 - 10.5 K/uL   RBC 4.48 3.87 - 5.11 MIL/uL   Hemoglobin 10.5 (L) 12.0 - 15.0 g/dL   HCT 31.0 (L) 36.0 - 46.0 %   MCV 69.2 (L) 78.0 - 100.0  fL   MCH 23.4 (L)  26.0 - 34.0 pg   MCHC 33.9 30.0 - 36.0 g/dL   RDW 15.1 11.5 - 15.5 %   Platelets 351 150 - 400 K/uL  Basic metabolic panel     Status: Abnormal   Collection Time: 11/10/14 12:45 PM  Result Value Ref Range   Sodium 144 137 - 147 mEq/L   Potassium 3.5 (L) 3.7 - 5.3 mEq/L   Chloride 104 96 - 112 mEq/L   CO2 28 19 - 32 mEq/L   Glucose, Bld 99 70 - 99 mg/dL   BUN 7 6 - 23 mg/dL   Creatinine, Ser 0.60 0.50 - 1.10 mg/dL   Calcium 9.1 8.4 - 10.5 mg/dL   GFR calc non Af Amer >90 >90 mL/min   GFR calc Af Amer >90 >90 mL/min    Comment: (NOTE) The eGFR has been calculated using the CKD EPI equation. This calculation has not been validated in all clinical situations. eGFR's persistently <90 mL/min signify possible Chronic Kidney Disease.    Anion gap 12 5 - 15  Troponin I (MHP)     Status: None   Collection Time: 11/10/14 12:45 PM  Result Value Ref Range   Troponin I <0.30 <0.30 ng/mL    Comment:        Due to the release kinetics of cTnI, a negative result within the first hours of the onset of symptoms does not rule out myocardial infarction with certainty. If myocardial infarction is still suspected, repeat the test at appropriate intervals.   D-dimer, quantitative     Status: Abnormal   Collection Time: 11/10/14 12:45 PM  Result Value Ref Range   D-Dimer, Quant 1.32 (H) 0.00 - 0.48 ug/mL-FEU    Comment:        AT THE INHOUSE ESTABLISHED CUTOFF VALUE OF 0.48 ug/mL FEU, THIS ASSAY HAS BEEN DOCUMENTED IN THE LITERATURE TO HAVE A SENSITIVITY AND NEGATIVE PREDICTIVE VALUE OF AT LEAST 98 TO 99%.  THE TEST RESULT SHOULD BE CORRELATED WITH AN ASSESSMENT OF THE CLINICAL PROBABILITY OF DVT / VTE.   CBC     Status: Abnormal   Collection Time: 12/15/14 11:07 AM  Result Value Ref Range   WBC 6.9 4.0 - 10.5 K/uL   RBC 4.44 3.87 - 5.11 Mil/uL   Platelets 416.0 (H) 150.0 - 400.0 K/uL   Hemoglobin 9.9 (L) 12.0 - 15.0 g/dL   HCT 31.6 (L) 36.0 - 46.0 %   MCV 71.3 (L) 78.0 - 100.0 fl     MCHC 31.4 30.0 - 36.0 g/dL   RDW 16.1 (H) 11.5 - 15.5 %  Comp Met (CMET)     Status: None   Collection Time: 12/15/14 11:07 AM  Result Value Ref Range   Sodium 139 135 - 145 mEq/L   Potassium 3.6 3.5 - 5.1 mEq/L   Chloride 108 96 - 112 mEq/L   CO2 26 19 - 32 mEq/L   Glucose, Bld 89 70 - 99 mg/dL   BUN 9 6 - 23 mg/dL   Creatinine, Ser 0.6 0.4 - 1.2 mg/dL   Total Bilirubin 0.5 0.2 - 1.2 mg/dL   Alkaline Phosphatase 72 39 - 117 U/L   AST 15 0 - 37 U/L   ALT 13 0 - 35 U/L   Total Protein 7.5 6.0 - 8.3 g/dL   Albumin 3.5 3.5 - 5.2 g/dL   Calcium 8.8 8.4 - 10.5 mg/dL   GFR 133.09 >60.00 mL/min  IgA  Status: None   Collection Time: 12/17/14  2:41 PM  Result Value Ref Range   IgA 195 68 - 378 mg/dL    Comment:      Assessment/Plan: Gastritis and gastroduodenitis Rx Prilosec BID. Continue Carafate suspension.  Increase fluids.  Take a daily probiotic.  Will check CBC, CMP and CT Abdomen to further assess.  Alarm signs discussed with patient.

## 2014-12-17 NOTE — Assessment & Plan Note (Signed)
Rx Prilosec BID. Continue Carafate suspension.  Increase fluids.  Take a daily probiotic.  Will check CBC, CMP and CT Abdomen to further assess.  Alarm signs discussed with patient.

## 2014-12-17 NOTE — Progress Notes (Addendum)
HPI :  Patient is 54 year old female referred by PCP for evaluation of anemia. Her hemoglobin has been slowly drifting since of 13 to 10.9 in December. Most recent hgb drawn a couple of days ago was 9.9. MCV is 71. Patient has noticed some dark but not black stools. Through the month of December she did consume a bottle of Ibuprofen. Patient has a history of GERD with good control of symptoms on H2 blocker.  PCP started her on a PPI a couple of days ago when anemia noted. Patient has no nausea or vomiting. She is becoming a little constipated as iron was just started. Patient believes her last colonoscopy was approximately 6 years ago but has not details.   Patient gives a several month history of mid upper abdominal pain, worse with meals but also exacerbated by movement. It's uncomfortable to lay down flat. She has gone from a size 10 to size 6 over the last few months because of the pain associated with eating. PCP ordered CT scan of the abdomen and pelvis with contrast which revealed a fluid density mass lesion in the mesentery just above the mid transverse colon. Lesion has mildly enlarged from the prior exam..  Patient has a remote history of a pneumothorax. In early December she developed chest pain / dizziness. Patient was evaluated in the emergency department, CT imaging was done. Right upper lobe pneumonia was suggested, antibiotics given, patient was referred to pulmonary seen by Dr.Wert.  No further workup suggested  Patient complains of intermittent, severe right flank   "spasms" which are incapacitating when they occur. She isn't clear if pain related in some way to the mid upper abdominal pain.    Past Medical History  Diagnosis Date  . Hypertension   . Depression   . Migraine   . Irregular heart rhythm     unclear what arrhythmia  . Pneumothorax     pneumonia  . Torus palatinus   . Supraventricular tachycardia, paroxysmal   . Anxiety   . GERD (gastroesophageal reflux  disease)   . Anemia   . SVT (supraventricular tachycardia)   . H. pylori infection     Family History  Problem Relation Age of Onset  . Depression Mother   . Diabetes Mother   . Heart disease Mother   . Hypertension Mother   . Hypertension Father   . Coronary artery disease Father   . Heart disease Father   . Arthritis Mother   . Mental illness Mother   . Heart failure Brother   . Heart disease Brother   . Hypertension Brother     x2  . Hypertension Sister    History  Substance Use Topics  . Smoking status: Never Smoker   . Smokeless tobacco: Never Used  . Alcohol Use: No   Current Outpatient Prescriptions  Medication Sig Dispense Refill  . albuterol (PROVENTIL HFA;VENTOLIN HFA) 108 (90 BASE) MCG/ACT inhaler Inhale 2 puffs into the lungs every 6 (six) hours as needed for wheezing or shortness of breath. 1 Inhaler 0  . beclomethasone (QVAR) 40 MCG/ACT inhaler Inhale 2 puffs into the lungs 2 (two) times daily at 10 AM and 5 PM. 1 Inhaler 2  . Ferrous Fumarate-Folic Acid (HEMOCYTE-F) 324-1 MG TABS Take 1 tablet by mouth daily. 30 each 2  . omeprazole (PRILOSEC) 20 MG capsule Take 1 capsule (20 mg total) by mouth 2 (two) times daily before a meal. 60 capsule 1  . sucralfate (CARAFATE) 1 GM/10ML suspension  Take 10 mLs (1 g total) by mouth 4 (four) times daily -  with meals and at bedtime. Prn pain, dyspepsia 420 mL 1  . acetaminophen (TYLENOL) 325 MG tablet Take 650 mg by mouth every 6 (six) hours as needed.    . cyclobenzaprine (FLEXERIL) 10 MG tablet Take 1/2 tablet at bedtime as needed for pain. 20 tablet 0  . hydrocortisone (ANUSOL-HC) 25 MG suppository Place 1 suppository (25 mg total) rectally 2 (two) times daily. (Patient not taking: Reported on 12/17/2014) 12 suppository 0  . ibuprofen (ADVIL,MOTRIN) 200 MG tablet Take 200 mg by mouth every 6 (six) hours as needed.    . loratadine (CLARITIN) 10 MG tablet Take 1 tablet (10 mg total) by mouth as needed. (Patient not taking:  Reported on 12/17/2014) 30 tablet 2  . MOVIPREP 100 G SOLR Take 1 kit (200 g total) by mouth as directed. 1 kit 0   No current facility-administered medications for this visit.   No Known Allergies   Review of Systems: Positive for fatigue, muscle pains, night sweats and shortness of breath All systems reviewed and negative except where noted in HPI.    Ct Abdomen Pelvis W Contrast  12/15/2014   CLINICAL DATA:  Patient having RLQ pains radiating into her right flank area all x 5-6 days, hx of GERD, anemia, SVT, hysterectomy, no other complaints  EXAM: CT ABDOMEN AND PELVIS WITH CONTRAST  TECHNIQUE: Multidetector CT imaging of the abdomen and pelvis was performed using the standard protocol following bolus administration of intravenous contrast.  CONTRAST:  148m OMNIPAQUE IOHEXOL 300 MG/ML  SOLN  COMPARISON:  Abdominal ultrasound, 11/05/2014.  CT, 05/01/2014.  FINDINGS: Fluid density mass or lesion lies in the central anterior abdomen just above the mid transverse colon. This measures 4.1 cm x 2.7 cm x 2.6 cm. In retrospect, on the prior study, there was a fluid density lesion measuring 3 cm x 2.3 cm x 3.5 cm. The fat adjacent to lesion shows mild stranding. This does not connect to bowel. Average Hounsfield units measure 8.7.  Heart is mild to moderately enlarged. There is stable platelike atelectasis at the posterior medial right lung base with additional linear atelectasis at both lung bases. These findings are stable.  Normal liver. Gallbladder are unremarkable. Mild stable prominence of the common bile duct and pancreatic duct. No pancreatic mass or inflammation.  Small low-density lesion at the dome of the spleen measures 1 cm. This was not evident on the prior study, which was a noncontrast exam. It was not present on a prior contrast enhanced study to from coronary 2009. No other splenic abnormality.  Normal adrenal glands.  18 mm low-density left midpole renal lesion consistent with a cyst. This  has increased in size from 12 mm on the 2009 exam. No other renal abnormality. Normal ureters. Bladder unremarkable.  Uterus surgically absent.  No pelvic masses.  No pathologically enlarged lymph nodes.  No ascites.  Mild increased stool burden noted throughout colon. No colonic wall thickening or inflammatory changes. Normal small bowel. Normal appendix.  Mild levoscoliosis of the lumbar spine. No osteoblastic or osteolytic lesions.  IMPRESSION: 1. No acute findings. 2. Mild increased stool burden in the colon. No colonic inflammation. Normal appendix is visualized. 3. Fluid density mass or lesion in the mesentery just above the mid transverse colon. This has mildly enlarged from the prior exam. Possible etiologies include a mesenteric inclusion cyst, enteric cyst, non pancreatic pseudocyst and lymphangioma. Neoplastic disease is felt unlikely but  not completely excluded. Consider follow-up PET-CT or possible biopsy/aspiration.   Electronically Signed   By: Lajean Manes M.D.   On: 12/15/2014 18:05    Physical Exam: BP 118/80 mmHg  Pulse 104  Ht 5' 9.75" (1.772 m)  Wt 150 lb 2 oz (68.096 kg)  BMI 21.69 kg/m2 Constitutional: Pleasant,well-developed, black female in no acute distress. HEENT: Normocephalic and atraumatic. Conjunctivae are normal. No scleral icterus. Neck supple.  Cardiovascular: Normal rate, regular rhythm.  Pulmonary/chest: Effort normal and breath sounds normal. No wheezing, rales or rhonchi. Abdominal: Soft, nondistended. I attempted to lay patient back on exam table but she had a severe pain in right flank area described as "spasm" so I was unable to exam abdomen.  Rectal: scant , light brown heme negative stools.  Extremities: no edema Lymphadenopathy: No cervical adenopathy noted. Neurological: Alert and oriented to person place and time. Skin: Skin is warm and dry. No rashes noted. Psychiatric: Normal mood and affect. Behavior is normal.   ASSESSMENT AND PLAN:  86.  55 year old female with microcytic anemia. She is Hemoccult-negative today. Hemoglobin began to decline in December, she was taking NSAIDs around this time. Etiology unclear. Will obtain celiac studies. Patient will need endoscopic workup. She is now on a PPI. I advised her to avoid NSAIDs for the time being. See #2.  2. Abnormal CT scan. There is a fluid density mass or lesion in the mesentery just above the mid transverse colon measuring 3 cm x 2.3 cm x 3.5 cm. There is adjacent fat stranding. Lesion does not connect to the bowel. Will see if lesion accessible by EUS. If not then may be CT-guided biopsy/aspiration. Let patient know and schedule accordingly.  3. Several month history of upper abdominal pain and significant weight loss. This may be due to #2. We'll look for other etiologies at time of upper endoscopy.  4. Severe right flank pain. Patient describes this as spasms. She had a severe attack while in the exam room making me unable to examine her abdomen. Not sure this related to her upper abdominal pain. She will try 5 mg of Flexeril at bedtime   Will call patient to schedule all of the above procedures. Perhaps they are could be done at one time when the patient is sedated. Another option is for EUS and EGD on one day and colonoscopy to follow at a later time.   Addendum: Reviewed and agree with initial management. See telephone note. Will proceed to upper endoscopy and colonoscopy to evaluate microcytic anemia. If unrevealing and abdominal pain persists consider surgical referral given slowly enlarging density/mass seen superior to the transverse colon. Renee Bears, MD

## 2014-12-17 NOTE — Telephone Encounter (Signed)
The pt has an appt for Gunnar Fusiaula today at 130 pm, [facility]

## 2014-12-17 NOTE — Patient Instructions (Addendum)
Please go to the basement level to have your labs drawn. You have been scheduled for an endoscopy and colonoscopy. Please follow the written instructions given to you at your visit today. Please pick up your prep at the pharmacy within the next 1-3 days. [facility]. If you use inhalers (even only as needed), please bring them with you on the day of your procedure. Your physician has requested that you go to www.startemmi.com and enter the access code given to you at your visit today. This web site gives a general overview about your procedure. However, you should still follow specific instructions given to you by our office regarding your preparation for the procedure.  Continue the Prilosec. Hold the iron supplement 1 week prior to the colonoscopy/Endoscopy.  We have sent a prescription for Flexeril 10 mg, take 1/2 tab at bedtime as needed for pain.

## 2014-12-18 ENCOUNTER — Telehealth: Payer: Self-pay | Admitting: *Deleted

## 2014-12-18 ENCOUNTER — Telehealth: Payer: Self-pay | Admitting: Internal Medicine

## 2014-12-18 NOTE — Telephone Encounter (Signed)
Discussed cystic lesion seen by CT with Dr. Emelia LoronMatthew Wakefield and Dr. Rob Buntinganiel Jacobs. Unclear if this lesion is source for pain and less likely source for IDA Either way patient needs upper endoscopy and colonoscopy for evaluation of iron deficiency anemia If pain persists may need surgical evaluation for possible resection of abdominal mesenteric lesion seen superior to the transverse colon Further plans after endoscopy

## 2014-12-18 NOTE — Telephone Encounter (Signed)
Called the patient and had to LM for her. I told her to call me back regarding some information regarding the Cyst/lesion noted on the CT scan per Dr. Christella HartiganJacobs.  Also I LM advising Dr Christella HartiganJacobs agrees with doing the Anemia work up in doing the EGD and colonoscopy.

## 2014-12-19 DIAGNOSIS — Z7689 Persons encountering health services in other specified circumstances: Secondary | ICD-10-CM

## 2014-12-21 LAB — T-TRANSGLUTAMINASE (TTG) IGG: TISSUE TRANSGLUT AB: 4 U/mL (ref 0–5)

## 2014-12-21 NOTE — Telephone Encounter (Signed)
LM again for the patient to please call me.

## 2014-12-22 ENCOUNTER — Encounter: Payer: Self-pay | Admitting: Internal Medicine

## 2014-12-22 ENCOUNTER — Encounter: Payer: Self-pay | Admitting: *Deleted

## 2014-12-22 ENCOUNTER — Encounter: Payer: Self-pay | Admitting: Physician Assistant

## 2014-12-22 ENCOUNTER — Ambulatory Visit (INDEPENDENT_AMBULATORY_CARE_PROVIDER_SITE_OTHER): Payer: BC Managed Care – PPO | Admitting: Internal Medicine

## 2014-12-22 ENCOUNTER — Telehealth: Payer: Self-pay | Admitting: *Deleted

## 2014-12-22 ENCOUNTER — Telehealth: Payer: Self-pay | Admitting: Family

## 2014-12-22 VITALS — BP 114/80 | HR 90 | Ht 70.0 in | Wt 148.0 lb

## 2014-12-22 DIAGNOSIS — R0789 Other chest pain: Secondary | ICD-10-CM

## 2014-12-22 DIAGNOSIS — R0989 Other specified symptoms and signs involving the circulatory and respiratory systems: Secondary | ICD-10-CM

## 2014-12-22 DIAGNOSIS — J989 Respiratory disorder, unspecified: Secondary | ICD-10-CM

## 2014-12-22 MED ORDER — HYDROCODONE-ACETAMINOPHEN 5-325 MG PO TABS: 1.0000 | ORAL_TABLET | ORAL | Status: DC | PRN

## 2014-12-22 NOTE — Patient Instructions (Signed)
Take norco up to one every 4 hours as needed for pain and get to some sleep   We will write you a note excusing you from work for the rest of this week  Further pain medication and work notes should be forthcoming from the GI division

## 2014-12-22 NOTE — Telephone Encounter (Signed)
Ok to call in rx for spacer.

## 2014-12-22 NOTE — Progress Notes (Addendum)
Subjective:    Patient ID: [name], female    DOB: 1961/02/11,  MRN: [id number]    Brief patient profile:  54 yobf never smoker with R PTx s/p surgery in 2003 with intermittent issues "stomach pains" dx as H Pylori then more indigestion/ cp starting March 2015 improved on rx as long as continued zantac then intermttent dysphagia/ worse cp starting around last week in Nov 2015  and eventually dx'd with ? Pneumonia by CT chest and referred to pulmonary clinic 11/24/2014  by Dr Alvira Monday.    History of Present Illness  11/24/2014 1st [facility] office visit/ [name]   Chief Complaint  Patient presents with  . Pulmonary Consult    Referred by Dr. Alvira Monday.  Pt c/o right side CP for the past month.  She was dxed with having PNA and placed on multiple abx. She states that she is feeling some better, but still has occ chest discomfort.  98% improved ? From motrin / first treated with zpak then biaxin, extremely variable hx taken by multiple providers Midline and right lower cp both Worse lying down  Pain just to right of sternum and under the r breast Temp 100.1 never shaking chllls, "only sweat was today"  Never coughed / no excess or purulent  mucus  rec Prevnar 13 today Finish the antibiotics If not 100% better, return after holidays  GERD  Diet  Zantac after bfast and at bedtime > continue but if any difficulty with swallowing or indigestion change to prilosec otc 20 mg Take 30-60 min before first meal of the day and continue the zantac at bedtime    12/22/2014  Acute ov/[name] re: chronic abd pain, new R lat cp  Chief Complaint  Patient presents with  . Acute Visit    Pt c/o right side "lung pain" for the past wk or so. She states that the pain is always present "but sometimes spasms".   presently on prilosec Take 30- 60 min before your first and last meals of the day p one month of zantac that eliminated her midline pain and overt HB Now has New pain = new location x  one week always present 24/7 since abrupt onset sometimes worse with deep breath, sometimes worse lying down, sometimes wakes up. There no assoc fever or cough or sign sweats or sob  No obvious day to day or daytime variabilty or assoc chronic cough or cp or chest tightness, subjective wheeze overt sinus or hb symptoms. No unusual exp hx or h/o childhood pna/ asthma or knowledge of premature birth.  Sleeping ok without nocturnal  or early am exacerbation  of respiratory  c/o's or need for noct saba. Also denies any obvious fluctuation of symptoms with weather or environmental changes or other aggravating or alleviating factors except as outlined above   Current Medications, Allergies, Complete Past Medical History, Past Surgical History, Family History, and Social History were reviewed in [employer] record.  ROS  The following are not active complaints unless bolded sore throat, dysphagia, dental problems, itching, sneezing,  nasal congestion or excess/ purulent secretions, ear ache,   fever, chills, sweats, unintended wt loss, pleuritic or exertional cp, hemoptysis,  orthopnea pnd or leg swelling, presyncope, palpitations, heartburn, abdominal pain, anorexia, nausea, vomiting, diarrhea  or change in bowel or urinary habits, change in stools or urine, dysuria,hematuria,  rash, arthralgias, visual complaints, headache, numbness weakness or ataxia or problems with walking or coordination,  change in mood/affect or memory.  Objective:   Physical Exam  Pleasant but quit anxious amb bf nad who  failed to answer   questions asked in a straightforward manner, tending to go off on tangents or answer questions with ambiguous medical terms or diagnoses and seemed somewhat aggravated  when asked the same question more than once for clarification.    .12/22/2014       148  Wt Readings from Last 3 Encounters:  11/24/14 152 lb 12.8 oz (69.31 kg)  11/18/14 154 lb  6.4 oz (70.035 kg)  11/10/14 152 lb (68.947 kg)    Vital signs reviewed  HEENT: nl dentition, turbinates, and orophanx. Nl external ear canals without cough reflex   NECK :  without JVD/Nodes/TM/ nl carotid upstrokes bilaterally   LUNGS: no acc muscle use, clear to A and P bilaterally without cough on insp or exp maneuvers   CV:  RRR  no s3 or murmur or increase in P2, no edema   ABD: gen mild tenderness  with nl excursion in the supine position. No bruits or organomegaly, bowel sounds nl  MS:  warm without deformities, calf tenderness, cyanosis or clubbing  SKIN: warm and dry without lesions    NEURO:  alert, approp, no deficits   Labs ordered today / images reviewed include:   Lab Results  Component Value Date   HGB 9.9* 12/15/2014   HGB 10.5* 11/10/2014   HGB 10.9* 11/03/2014     CT abd 12/14/14 neg for effusion on R or any parenchymal changes   Lab Results  Component Value Date   ESRSEDRATE 51* 11/03/2014      cxr 12/14/14 mild mid thoracic vertebral body compression fracture .       Assessment & Plan:

## 2014-12-22 NOTE — Assessment & Plan Note (Signed)
No evidence at all of asthma > rec d/c all inhalers as if she was ever wheezing it was most likely a "reflex to reflux" which is now eliminated with gerd rx.

## 2014-12-22 NOTE — Assessment & Plan Note (Addendum)
cxr 12/14/14  mild mid thoracic vertebral body compression fracture   I had an extended summary  discussion with the patient reviewing all relevant studies completed to date and  lasting 15 to 20 minutes of a 25 minute visit on the following ongoing concerns:   Not really sure this is a  "new pain" location but even if it is it is  Either referred from a T spine compression fx or  referred from whatever is causing her abd pain and wt loss and in the absence of an effusion I don't see any reason to pursue a separate dx , and even if she had one it would most likely be "sympathetic" from the abd source  Therefore rec control the pain while awaiting GI w/u > rx norco one q 4h prn and out of work the rest of the week and defer w/u of T spine to primary care ? Needs mri T spine ?  See instructions for specific recommendations which were reviewed directly with the patient who was given a copy with highlighter outlining the key components.

## 2014-12-22 NOTE — Telephone Encounter (Signed)
Pt states that Malva Coganody Martin suggested a spacer for her inhaler- pharmacy called for RX

## 2014-12-22 NOTE — Telephone Encounter (Signed)
Never spoke to patient regarding this. I have never seen her for respiratory issues.  Will defer to her PCP.

## 2014-12-22 NOTE — Telephone Encounter (Signed)
I spoke to [name] and explained that Dr. Christella HartiganJacobs feels that the Egd/Colon for the anemia workup is a good idea  Also he thinks the cyst she has in the mesentery above the transverse colon is an older cyst and should be removed. After the procedures we will contact her about a consult at [facility] to have the cyst removed.  [name] thanked me for the information .

## 2014-12-22 NOTE — Telephone Encounter (Signed)
Patient dropped off FMLA paperwork. She was out of work 11/13/2014 until 12/07/2014 due to pneumonia and stomach issues. Forms filled out as much as possible and [facility]. JG//CMA

## 2014-12-23 ENCOUNTER — Telehealth: Payer: Self-pay | Admitting: Internal Medicine

## 2014-12-23 ENCOUNTER — Telehealth: Payer: Self-pay | Admitting: Family

## 2014-12-23 NOTE — Telephone Encounter (Signed)
Gave verbal [facility] to [facility] to give pt a spacer with inhalers sent in on 11/18/14 by PA, Saguier.

## 2014-12-23 NOTE — Telephone Encounter (Signed)
Caller name: [name], Eriona Relation to pt: self  Call back number: [phone]   Reason for call:  Pt states FMLA are due today. If there not going to fax today her pay will be docked.

## 2014-12-23 NOTE — Telephone Encounter (Signed)
Please advise 

## 2014-12-23 NOTE — Telephone Encounter (Signed)
Patient is rescheduled for tomorrow and said she no longer needs a note

## 2014-12-23 NOTE — Telephone Encounter (Signed)
[facility] [employer] [facility] [employer] [facility] [phone] and pt has been notified.

## 2014-12-23 NOTE — Telephone Encounter (Signed)
Paperwork is currently with Melissa.

## 2014-12-23 NOTE — Telephone Encounter (Signed)
Paperwork is signed

## 2014-12-23 NOTE — Telephone Encounter (Signed)
Sheri, I will give her a work note to be off on day of colonoscopy. She was scheduled for early Feb but may be changing that to tomorrow afternoon which I can also give her a note for. Just let me know.  Thanks, Gunnar FusiPaula

## 2014-12-24 ENCOUNTER — Encounter: Payer: Self-pay | Admitting: Internal Medicine

## 2014-12-24 ENCOUNTER — Ambulatory Visit (AMBULATORY_SURGERY_CENTER): Payer: BC Managed Care – PPO | Admitting: Internal Medicine

## 2014-12-24 VITALS — BP 131/60 | HR 84 | Temp 98.9°F | Resp 19 | Ht 69.75 in | Wt 150.0 lb

## 2014-12-24 DIAGNOSIS — R1033 Periumbilical pain: Secondary | ICD-10-CM

## 2014-12-24 DIAGNOSIS — R109 Unspecified abdominal pain: Secondary | ICD-10-CM

## 2014-12-24 DIAGNOSIS — K2950 Unspecified chronic gastritis without bleeding: Secondary | ICD-10-CM

## 2014-12-24 DIAGNOSIS — B3781 Candidal esophagitis: Secondary | ICD-10-CM

## 2014-12-24 DIAGNOSIS — D509 Iron deficiency anemia, unspecified: Secondary | ICD-10-CM

## 2014-12-24 DIAGNOSIS — B370 Candidal stomatitis: Secondary | ICD-10-CM

## 2014-12-24 DIAGNOSIS — R938 Abnormal findings on diagnostic imaging of other specified body structures: Secondary | ICD-10-CM

## 2014-12-24 MED ORDER — FLUCONAZOLE 200 MG PO TABS: ORAL_TABLET | ORAL | Status: DC

## 2014-12-24 MED ORDER — SODIUM CHLORIDE 0.9 % IV SOLN: 500.0000 mL | INTRAVENOUS | Status: DC

## 2014-12-24 NOTE — Op Note (Signed)
[facility] [address] [place], [id number]   ENDOSCOPY PROCEDURE REPORT  PATIENT: [name], Renee  MR#: [id number] BIRTHDATE: 15-Nov-1961 , 53  yrs. old GENDER: female ENDOSCOPIST: Beverley FiedlerJay M Cottrell Gentles, MD REFERRED BY:  Sandford CrazeMelissa O'Sullivan, FNP PROCEDURE DATE:  12/24/2014 PROCEDURE:  EGD w/ biopsy ASA CLASS:     Class III INDICATIONS:  epigastric pain, abdominal pain, iron deficiency anemia, and abnormal CT of the GI tract. MEDICATIONS: Monitored anesthesia care and Propofol 200 mg IV TOPICAL ANESTHETIC: none  DESCRIPTION OF PROCEDURE: After the risks benefits and alternatives of the procedure were thoroughly explained, informed consent was obtained.  The LB [id number] endoscope was introduced through the mouth and advanced to the second portion of the duodenum , Without limitations.  The instrument was slowly withdrawn as the mucosa was fully examined.  ESOPHAGUS: White exudates consistent with candidiasis were found in the lower third of the esophagus and middle third esophagus.  STOMACH: The mucosa of the stomach appeared normal.  Cold forcep biopsies were taken at the gastric body, antrum and angularis to evaluate for h.  pylori.  DUODENUM: The duodenal mucosa showed no abnormalities in the bulb and 2nd part of the duodenum.  Cold forceps biopsies were taken in the bulb and second portion.  Retroflexed views revealed no abnormalities.     The scope was then withdrawn from the patient and the procedure completed.  COMPLICATIONS: There were no immediate complications.  ENDOSCOPIC IMPRESSION: 1.   White exudates consistent with candidiasis in the lower third of the esophagus and middle third esophagus 2.   The mucosa of the stomach appeared normal; multiple biopsies 3.   The duodenal mucosa showed no abnormalities in the bulb and 2nd part of the duodenum cold forceps biopsies were taken in the bulb and second portion  RECOMMENDATIONS: 1.   Await biopsy results 2.  Fluconazole 400 mg -1 day then fluconazole 200 mg -13 days 3.  Proceed with a Colonoscopy.  eSigned:  Beverley FiedlerJay M Roque Schill, MD 12/24/2014 3:40 PM   CC:The Patient and Sandford CrazeMelissa O'Sullivan, N.  P.

## 2014-12-24 NOTE — Progress Notes (Signed)
Called to room to assist during endoscopic procedure.  Patient ID and intended procedure confirmed with present staff. Received instructions for my participation in the procedure from the performing physician.  

## 2014-12-24 NOTE — Op Note (Signed)
[facility] [address] [place], [id number]   COLONOSCOPY PROCEDURE REPORT  PATIENT: [name], Renee  MR#: [id number] BIRTHDATE: [birth year] , 53  yrs. old GENDER: female ENDOSCOPIST: Beverley FiedlerJay M Hilja Kintzel, MD REFERRED XB:[id number]:Melissa Val EagleLendell Caprice' Sullivan, N.P. PROCEDURE DATE:  12/24/2014 PROCEDURE:   Colonoscopy, screening First Screening Colonoscopy - Avg.  risk and is 50 yrs.  old or older - No.  Prior Negative Screening - Now for repeat screening. N/A  History of Adenoma - Now for follow-up colonoscopy & has been > or = to 3 yrs.  N/A  Polyps Removed Today? No.  Polyps Removed Today? No.  Recommend repeat exam, <10 yrs? Polyps Removed Today? No.  Recommend repeat exam, <10 yrs? Yes.  Polyps Removed Today? No.  Recommend repeat exam, <10 yrs? Yes.  Inadequate prep. ASA CLASS:   Class III INDICATIONS:abdominal pain, an abnormal CT, and iron deficiency anemia. MEDICATIONS: Monitored anesthesia care, Propofol 100 mg IV, and Residual sedation present  DESCRIPTION OF PROCEDURE:   After the risks benefits and alternatives of the procedure were thoroughly explained, informed consent was obtained.  The digital rectal exam revealed no rectal mass.   The LB PFC-H190 [id number]  endoscope was introduced through the anus and advanced to the sigmoid colon. Limited by poor preparation.   The quality of the prep was poor, using MoviPrep The instrument was then slowly withdrawn as the colon was fully examined.      COLON FINDINGS: A significant amount of stool was present in the rectosigmoid colon. the liquid and semisolid stool was to abundant to be cleared by irrigation and lavage.  The exam was terminated Retroflexion was not performed.      The scope was withdrawn and the procedure completed.  COMPLICATIONS: There were no immediate complications.  ENDOSCOPIC IMPRESSION: Significant amount of stool was present in the rectosigmoid colon preventing visualization, exam aborted due  to poor prep  RECOMMENDATIONS: Repeat colonoscopy with 2 day bowel preparation very next available spot  eSigned:  Beverley FiedlerJay M Charbel Los, MD 12/24/2014 3:46 PM   cc: The Patient and Sandford CrazeO'Sullivan, Melissa FNP

## 2014-12-24 NOTE — Progress Notes (Signed)
Report to PACU, RN, vss, BBS= Clear.  

## 2014-12-24 NOTE — Patient Instructions (Signed)
Discharge instructions given. Biopsies taken. Rescheduled colonoscopy with 2 day prep. Resume previous medications. Your may notice some irritation in your nose or some drainage.  This may cause feelings of congestion.  This is from the oxygen, which can be very drying.  There is no need for concern, this should clear up in a day or so YOU HAD AN ENDOSCOPIC PROCEDURE TODAY AT THE [facility]: Refer to the procedure report that was given to you for any specific questions about what was found during the examination.  If the procedure report does not answer your questions, please call your gastroenterologist to clarify.  If you requested that your care partner not be given the details of your procedure findings, then the procedure report has been included in a sealed envelope for you to review at your convenience later.  YOU SHOULD EXPECT: Some feelings of bloating in the abdomen. Passage of more gas than usual.  Walking can help get rid of the air that was put into your GI tract during the procedure and reduce the bloating. If you had a lower endoscopy (such as a colonoscopy or flexible sigmoidoscopy) you may notice spotting of blood in your stool or on the toilet paper. If you underwent a bowel prep for your procedure, then you may not have a normal bowel movement for a few days.  DIET: Your first meal following the procedure should be a light meal and then it is ok to progress to your normal diet.  A half-sandwich or bowl of soup is an example of a good first meal.  Heavy or fried foods are harder to digest and may make you feel nauseous or bloated.  Likewise meals heavy in dairy and vegetables can cause extra gas to form and this can also increase the bloating.  Drink plenty of fluids but you should avoid alcoholic beverages for 24 hours.  ACTIVITY: Your care partner should take you home directly after the procedure.  You should plan to take it easy, moving slowly for the rest of the day.   You can resume normal activity the day after the procedure however you should NOT DRIVE or use heavy machinery for 24 hours (because of the sedation medicines used during the test).    SYMPTOMS TO REPORT IMMEDIATELY: A gastroenterologist can be reached at any hour.  During normal business hours, 8:30 AM to 5:00 PM Monday through Friday, call [phone].  After hours and on weekends, please call the GI answering service at [phone] who will take a message and have the physician on call contact you.   Following lower endoscopy (colonoscopy or flexible sigmoidoscopy):  Excessive amounts of blood in the stool  Significant tenderness or worsening of abdominal pains  Swelling of the abdomen that is new, acute  Fever of 100F or higher  Following upper endoscopy (EGD)  Vomiting of blood or coffee ground material  New chest pain or pain under the shoulder blades  Painful or persistently difficult swallowing  New shortness of breath  Fever of 100F or higher  Black, tarry-looking stools  FOLLOW UP: If any biopsies were taken you will be contacted by phone or by letter within the next 1-3 weeks.  Call your gastroenterologist if you have not heard about the biopsies in 3 weeks.  Our staff will call the home number listed on your records the next business day following your procedure to check on you and address any questions or concerns that you may have at that  time regarding the information given to you following your procedure. This is a courtesy call and so if there is no answer at the home number and we have not heard from you through the emergency physician on call, we will assume that you have returned to your regular daily activities without incident.  SIGNATURES/CONFIDENTIALITY: You and/or your care partner have signed paperwork which will be entered into your electronic medical record.  These signatures attest to the fact that that the information above on your After Visit Summary  has been reviewed and is understood.  Full responsibility of the confidentiality of this discharge information lies with you and/or your care-partner.

## 2014-12-24 NOTE — Progress Notes (Signed)
Went over 2 day prep instructions with pt and pt's sister, pt verbalized understanding of prep instructions and denies questions at this time-adm

## 2014-12-28 ENCOUNTER — Telehealth: Payer: Self-pay | Admitting: *Deleted

## 2014-12-28 NOTE — Telephone Encounter (Signed)
  Follow up Call-  Call back number 12/24/2014  Post procedure Call Back phone  # [phone]   Permission to leave phone message Yes    No answer, left message.

## 2014-12-29 ENCOUNTER — Encounter: Payer: Self-pay | Admitting: Internal Medicine

## 2014-12-29 ENCOUNTER — Ambulatory Visit (AMBULATORY_SURGERY_CENTER): Payer: BC Managed Care – PPO | Admitting: Internal Medicine

## 2014-12-29 VITALS — BP 133/74 | HR 69 | Temp 98.7°F | Resp 18 | Ht 69.0 in | Wt 150.0 lb

## 2014-12-29 DIAGNOSIS — R1033 Periumbilical pain: Secondary | ICD-10-CM

## 2014-12-29 DIAGNOSIS — R938 Abnormal findings on diagnostic imaging of other specified body structures: Secondary | ICD-10-CM

## 2014-12-29 DIAGNOSIS — D509 Iron deficiency anemia, unspecified: Secondary | ICD-10-CM

## 2014-12-29 DIAGNOSIS — R9389 Abnormal findings on diagnostic imaging of other specified body structures: Secondary | ICD-10-CM

## 2014-12-29 DIAGNOSIS — R933 Abnormal findings on diagnostic imaging of other parts of digestive tract: Secondary | ICD-10-CM

## 2014-12-29 MED ORDER — SODIUM CHLORIDE 0.9 % IV SOLN: 500.0000 mL | INTRAVENOUS | Status: DC

## 2014-12-29 NOTE — Patient Instructions (Signed)
YOU HAD AN ENDOSCOPIC PROCEDURE TODAY AT THE [facility]: Refer to the procedure report that was given to you for any specific questions about what was found during the examination.  If the procedure report does not answer your questions, please call your gastroenterologist to clarify.  If you requested that your care partner not be given the details of your procedure findings, then the procedure report has been included in a sealed envelope for you to review at your convenience later.  YOU SHOULD EXPECT: Some feelings of bloating in the abdomen. Passage of more gas than usual.  Walking can help get rid of the air that was put into your GI tract during the procedure and reduce the bloating. If you had a lower endoscopy (such as a colonoscopy or flexible sigmoidoscopy) you may notice spotting of blood in your stool or on the toilet paper. If you underwent a bowel prep for your procedure, then you may not have a normal bowel movement for a few days.  DIET: Your first meal following the procedure should be a light meal and then it is ok to progress to your normal diet.  A half-sandwich or bowl of soup is an example of a good first meal.  Heavy or fried foods are harder to digest and may make you feel nauseous or bloated.  Likewise meals heavy in dairy and vegetables can cause extra gas to form and this can also increase the bloating.  Drink plenty of fluids but you should avoid alcoholic beverages for 24 hours.  ACTIVITY: Your care partner should take you home directly after the procedure.  You should plan to take it easy, moving slowly for the rest of the day.  You can resume normal activity the day after the procedure however you should NOT DRIVE or use heavy machinery for 24 hours (because of the sedation medicines used during the test).    SYMPTOMS TO REPORT IMMEDIATELY: A gastroenterologist can be reached at any hour.  During normal business hours, 8:30 AM to 5:00 PM Monday through Friday,  call [phone].  After hours and on weekends, please call the GI answering service at [phone] who will take a message and have the physician on call contact you.   Following lower endoscopy (colonoscopy or flexible sigmoidoscopy):  Excessive amounts of blood in the stool  Significant tenderness or worsening of abdominal pains  Swelling of the abdomen that is new, acute  Fever of 100F or higher  FOLLOW UP: If any biopsies were taken you will be contacted by phone or by letter within the next 1-3 weeks.  Call your gastroenterologist if you have not heard about the biopsies in 3 weeks.  Our staff will call the home number listed on your records the next business day following your procedure to check on you and address any questions or concerns that you may have at that time regarding the information given to you following your procedure. This is a courtesy call and so if there is no answer at the home number and we have not heard from you through the emergency physician on call, we will assume that you have returned to your regular daily activities without incident.  SIGNATURES/CONFIDENTIALITY: You and/or your care partner have signed paperwork which will be entered into your electronic medical record.  These signatures attest to the fact that that the information above on your After Visit Summary has been reviewed and is understood.  Full responsibility of the confidentiality of this   discharge information lies with you and/or your care-partner.  Resume medications. 

## 2014-12-29 NOTE — Progress Notes (Signed)
Report to PACU, RN, vss, BBS= Clear.  

## 2014-12-29 NOTE — Op Note (Signed)
[facility] [address] [place], [id number]   COLONOSCOPY PROCEDURE REPORT  PATIENT: [name], [name]  MR#: [id number] BIRTHDATE: [birth year] , 53  yrs. old GENDER: female ENDOSCOPIST: Beverley FiedlerJay M Slate Debroux, MD REFERRED [id number]:Melissa Peggyann Juba'Sullivan, FNP PROCEDURE DATE:  12/29/2014 PROCEDURE:   Colonoscopy, diagnostic First Screening Colonoscopy - Avg.  risk and is 50 yrs.  old or older - No.  Prior Negative Screening - Now for repeat screening. N/A  History of Adenoma - Now for follow-up colonoscopy & has been > or = to 3 yrs.  N/A  Polyps Removed Today? No.  Polyps Removed Today? No.  Recommend repeat exam, <10 yrs? Polyps Removed Today? No.  Recommend repeat exam, <10 yrs? No. ASA CLASS:   Class III INDICATIONS:iron deficiency anemia, mid-abdominal pain, and an abnormal CT. MEDICATIONS: Monitored anesthesia care and Propofol 300 mg IV  DESCRIPTION OF PROCEDURE:   After the risks benefits and alternatives of the procedure were thoroughly explained, informed consent was obtained.  The digital rectal exam revealed no abnormalities of the rectum.   The LB PFC-H190 [id number]  endoscope was introduced through the anus and advanced to the cecum, which was identified by both the appendix and ileocecal valve. No adverse events experienced.   The quality of the prep was good, using MoviPrep  The instrument was then slowly withdrawn as the colon was fully examined.   COLON FINDINGS: A normal appearing cecum, ileocecal valve, and appendiceal orifice were identified.  the ascending, transverse, descending, sigmoid colon, and rectum appeared unremarkable. Retroflexed views revealed internal hemorrhoids. The time to cecum=7 minutes 02 seconds.  Withdrawal time=9 minutes 51 seconds. The scope was withdrawn and the procedure completed.  COMPLICATIONS: There were no complications.  ENDOSCOPIC IMPRESSION: Normal colonoscopy    RECOMMENDATIONS: 1.  Given improvement in pain after  colonoscopy preparation, begin MiraLax 17 g daily to avoid constipation 2.  Surgical referral to Dr.  Dwain SarnaWakefield for evaluation of abdominal mass lesion seen by CT scan 3.  Oral iron replacement, repeat CBC and iron studies in 1 month  eSigned:  Beverley FiedlerJay M Demari Gales, MD 12/29/2014 3:20 PM   cc: the patient, Sandford CrazeMelissa O'Sullivan, Dr. Harden MoMatt Wakefield   PATIENT NAME:  [name], [name] MR#: [id number]

## 2014-12-30 ENCOUNTER — Telehealth: Payer: Self-pay | Admitting: *Deleted

## 2014-12-30 ENCOUNTER — Telehealth: Payer: Self-pay

## 2014-12-30 NOTE — Telephone Encounter (Signed)
  Follow up Call-  Call back number 12/29/2014 12/24/2014  Post procedure Call Back phone  # [phone]   Permission to leave phone message Yes Yes     No answer at # given.  Left message on VM.

## 2014-12-30 NOTE — Telephone Encounter (Signed)
Pt scheduled to see Dr. Dwain SarnaWakefield with [facility] 01/11/15@2 :30pm for a 3pm appt. Left message for pt to call back.

## 2014-12-31 ENCOUNTER — Telehealth: Payer: Self-pay

## 2014-12-31 NOTE — Telephone Encounter (Signed)
Pt scheduled to see Dr. Dwain SarnaWakefield with [facility] 01/11/15@3pm , pt to arrive there at 2:30pm. Pt being seen for Abd mass found on CT scan. Left message for pt to call back.

## 2014-12-31 NOTE — Telephone Encounter (Signed)
Spoke with pt and she is aware of appt. Pt needs note for work, let pt know we would write her out through her OV with Dr. Dwain SarnaWakefield. Letter left up front for pickup.

## 2015-01-01 ENCOUNTER — Encounter: Payer: Self-pay | Admitting: Internal Medicine

## 2015-01-12 ENCOUNTER — Encounter: Payer: BC Managed Care – PPO | Admitting: Internal Medicine

## 2015-01-12 ENCOUNTER — Telehealth: Payer: Self-pay | Admitting: Internal Medicine

## 2015-01-12 NOTE — Telephone Encounter (Signed)
Patient reports that Dr. Dwain SarnaWakefield does not recommend surgery he does not feel that it will help her pain.  She wants the note to be through Sunday and return on 01/18/15.  Please advise

## 2015-01-12 NOTE — Telephone Encounter (Signed)
Letter placed upfront.  She will come in and pick it up.

## 2015-01-12 NOTE — Telephone Encounter (Signed)
Return to work with be up to Dr. Dwain SarnaWakefield depending on plans for surgery, etc. I am okay if she returns and is feeling up to it, but surgery may be planned that I am not yet aware of (I have not received Dr. Doreen SalvageWakefield's consult note yet)

## 2015-01-12 NOTE — Telephone Encounter (Signed)
Left message for patient to call back Dr. Rhea BeltonPyrtle, last note said that she could have a letter keeping her out of work until office visit with Dr. Dwain SarnaWakefield that was yesterday.  Is it ok to send a letter saying she can return on Monday 2/15

## 2015-01-13 ENCOUNTER — Telehealth: Payer: Self-pay | Admitting: Medical

## 2015-01-13 DIAGNOSIS — R9389 Abnormal findings on diagnostic imaging of other specified body structures: Secondary | ICD-10-CM

## 2015-01-13 DIAGNOSIS — J984 Other disorders of lung: Secondary | ICD-10-CM

## 2015-01-13 NOTE — Telephone Encounter (Signed)
I talked with Dr. Roque LiasJames Green MD today regarding pt ct chest. He recommends repeating. I called for clarification since pulmonology note did not mention any further need for work up. So I will put that order in.

## 2015-01-13 NOTE — Telephone Encounter (Signed)
Repeat chest ct. Persistent rt apex density. Placing the order.

## 2015-01-13 NOTE — Telephone Encounter (Signed)
I did call pt to discuss that want to repeat ct of her chest(after reviewing pulmonology and old ct report of chest. No answer but did leave a message on her machine. Will wait for her to call. Will try to call her later this week if no call back by Friday.

## 2015-01-17 ENCOUNTER — Telehealth: Payer: Self-pay | Admitting: Medical

## 2015-01-17 NOTE — Telephone Encounter (Signed)
I was in correct. Appears had Ct in December. So will try to get the ct of chest repeat in march.(Not April as previous note reads)

## 2015-01-17 NOTE — Telephone Encounter (Signed)
Referral staff talked with insurance and insurance stated that ct won't be authorized to repeat any sooner than 3 months from the last. So will order repeat for April.

## 2015-01-18 ENCOUNTER — Telehealth: Payer: Self-pay | Admitting: Family

## 2015-01-18 NOTE — Telephone Encounter (Signed)
Called pt twice today. No answer. So will talk with her tomorrow.

## 2015-01-18 NOTE — Telephone Encounter (Signed)
Caller name:Dayshia  Relationship to patient:self   Reason for call: PT was returning your call regarding past appointment- did not want to go into detail with me regarding her concern and declined me sending a note for a call back. She requested that she be scheduled for tomorrow so that she can speak with you RE: personal concern and asthma. I put her in as a 30 minute office visit tomorrow @ 930.

## 2015-01-19 ENCOUNTER — Ambulatory Visit (INDEPENDENT_AMBULATORY_CARE_PROVIDER_SITE_OTHER): Payer: BC Managed Care – PPO | Admitting: Medical

## 2015-01-19 ENCOUNTER — Telehealth: Payer: Self-pay

## 2015-01-19 ENCOUNTER — Emergency Department (HOSPITAL_BASED_OUTPATIENT_CLINIC_OR_DEPARTMENT_OTHER): Payer: BC Managed Care – PPO

## 2015-01-19 ENCOUNTER — Encounter: Payer: Self-pay | Admitting: Medical

## 2015-01-19 ENCOUNTER — Encounter (HOSPITAL_BASED_OUTPATIENT_CLINIC_OR_DEPARTMENT_OTHER): Payer: Self-pay | Admitting: *Deleted

## 2015-01-19 ENCOUNTER — Emergency Department (HOSPITAL_BASED_OUTPATIENT_CLINIC_OR_DEPARTMENT_OTHER)
Admission: EM | Admit: 2015-01-19 | Discharge: 2015-01-19 | Disposition: A | Discharge status: Home / Self Care | Payer: BC Managed Care – PPO | Attending: Emergency Medicine | Admitting: Emergency Medicine

## 2015-01-19 ENCOUNTER — Telehealth: Payer: Self-pay | Admitting: Medical

## 2015-01-19 ENCOUNTER — Ambulatory Visit: Payer: BC Managed Care – PPO | Admitting: Medical

## 2015-01-19 VITALS — BP 123/83 | HR 98 | Temp 97.7°F | Ht 70.0 in | Wt 144.6 lb

## 2015-01-19 DIAGNOSIS — M8448XA Pathological fracture, other site, initial encounter for fracture: Secondary | ICD-10-CM

## 2015-01-19 DIAGNOSIS — S22000A Wedge compression fracture of unspecified thoracic vertebra, initial encounter for closed fracture: Secondary | ICD-10-CM | POA: Insufficient documentation

## 2015-01-19 DIAGNOSIS — R0789 Other chest pain: Secondary | ICD-10-CM | POA: Insufficient documentation

## 2015-01-19 DIAGNOSIS — R0602 Shortness of breath: Secondary | ICD-10-CM | POA: Diagnosis present

## 2015-01-19 DIAGNOSIS — D649 Anemia, unspecified: Secondary | ICD-10-CM | POA: Diagnosis not present

## 2015-01-19 DIAGNOSIS — F419 Anxiety disorder, unspecified: Secondary | ICD-10-CM | POA: Diagnosis not present

## 2015-01-19 DIAGNOSIS — Z7952 Long term (current) use of systemic steroids: Secondary | ICD-10-CM | POA: Diagnosis not present

## 2015-01-19 DIAGNOSIS — I471 Supraventricular tachycardia: Secondary | ICD-10-CM

## 2015-01-19 DIAGNOSIS — Z8739 Personal history of other diseases of the musculoskeletal system and connective tissue: Secondary | ICD-10-CM | POA: Diagnosis not present

## 2015-01-19 DIAGNOSIS — I10 Essential (primary) hypertension: Secondary | ICD-10-CM | POA: Insufficient documentation

## 2015-01-19 DIAGNOSIS — K219 Gastro-esophageal reflux disease without esophagitis: Secondary | ICD-10-CM | POA: Insufficient documentation

## 2015-01-19 DIAGNOSIS — Z8701 Personal history of pneumonia (recurrent): Secondary | ICD-10-CM | POA: Diagnosis not present

## 2015-01-19 DIAGNOSIS — M8580 Other specified disorders of bone density and structure, unspecified site: Secondary | ICD-10-CM

## 2015-01-19 DIAGNOSIS — R0600 Dyspnea, unspecified: Secondary | ICD-10-CM

## 2015-01-19 DIAGNOSIS — Z79899 Other long term (current) drug therapy: Secondary | ICD-10-CM | POA: Diagnosis not present

## 2015-01-19 DIAGNOSIS — Z8619 Personal history of other infectious and parasitic diseases: Secondary | ICD-10-CM | POA: Insufficient documentation

## 2015-01-19 DIAGNOSIS — R5383 Other fatigue: Secondary | ICD-10-CM | POA: Insufficient documentation

## 2015-01-19 LAB — CBC WITH DIFFERENTIAL/PLATELET
BASOS ABS: 0 10*3/uL (ref 0.0–0.1)
Basophils Absolute: 0 10*3/uL (ref 0.0–0.1)
Basophils Relative: 0.4 % (ref 0.0–3.0)
Basophils Relative: 0 % (ref 0–1)
EOS ABS: 0.1 10*3/uL (ref 0.0–0.7)
EOS ABS: 0.1 10*3/uL (ref 0.0–0.7)
Eosinophils Relative: 1 % (ref 0.0–5.0)
Eosinophils Relative: 1 % (ref 0–5)
HCT: 28 % — ABNORMAL LOW (ref 36.0–46.0)
HEMATOCRIT: 32.7 % — AB (ref 36.0–46.0)
HEMOGLOBIN: 10.6 g/dL — AB (ref 12.0–15.0)
Hemoglobin: 9.2 g/dL — ABNORMAL LOW (ref 12.0–15.0)
LYMPHS PCT: 17.5 % (ref 12.0–46.0)
Lymphocytes Relative: 16 % (ref 12–46)
Lymphs Abs: 1.6 10*3/uL (ref 0.7–4.0)
Lymphs Abs: 1.6 10*3/uL (ref 0.7–4.0)
MCH: 21.8 pg — ABNORMAL LOW (ref 26.0–34.0)
MCHC: 32.3 g/dL (ref 30.0–36.0)
MCHC: 32.9 g/dL (ref 30.0–36.0)
MCV: 66.4 fL — AB (ref 78.0–100.0)
MONO ABS: 0.7 10*3/uL (ref 0.1–1.0)
MONOS PCT: 8 % (ref 3–12)
Monocytes Absolute: 0.8 10*3/uL (ref 0.1–1.0)
Monocytes Relative: 7.4 % (ref 3.0–12.0)
NEUTROS ABS: 7.2 10*3/uL (ref 1.7–7.7)
Neutro Abs: 6.9 10*3/uL (ref 1.4–7.7)
Neutrophils Relative %: 73.7 % (ref 43.0–77.0)
Neutrophils Relative %: 75 % (ref 43–77)
Platelets: 474 10*3/uL — ABNORMAL HIGH (ref 150.0–400.0)
Platelets: 458 10*3/uL — ABNORMAL HIGH (ref 150–400)
RBC: 4.83 Mil/uL (ref 3.87–5.11)
RBC: 4.22 MIL/uL (ref 3.87–5.11)
RDW: 17.3 % — AB (ref 11.5–15.5)
RDW: 16.6 % — AB (ref 11.5–15.5)
WBC: 9.3 10*3/uL (ref 4.0–10.5)
WBC: 9.7 10*3/uL (ref 4.0–10.5)

## 2015-01-19 LAB — COMPREHENSIVE METABOLIC PANEL
ALT: 31 U/L (ref 0–35)
AST: 25 U/L (ref 0–37)
Albumin: 3.3 g/dL — ABNORMAL LOW (ref 3.5–5.2)
Alkaline Phosphatase: 119 U/L — ABNORMAL HIGH (ref 39–117)
Anion gap: 5 (ref 5–15)
BUN: 14 mg/dL (ref 6–23)
CO2: 26 mmol/L (ref 19–32)
CREATININE: 0.64 mg/dL (ref 0.50–1.10)
Calcium: 8.5 mg/dL (ref 8.4–10.5)
Chloride: 107 mmol/L (ref 96–112)
Glucose, Bld: 120 mg/dL — ABNORMAL HIGH (ref 70–99)
Potassium: 3.5 mmol/L (ref 3.5–5.1)
Sodium: 138 mmol/L (ref 135–145)
TOTAL PROTEIN: 7.5 g/dL (ref 6.0–8.3)
Total Bilirubin: 0.4 mg/dL (ref 0.3–1.2)

## 2015-01-19 LAB — TROPONIN I: TNIDX: 0.01 ug/L (ref 0.00–0.06)

## 2015-01-19 LAB — D-DIMER, QUANTITATIVE: D-Dimer, Quant: 1.96 ug/mL-FEU — ABNORMAL HIGH (ref 0.00–0.48)

## 2015-01-19 MED ORDER — IOHEXOL 350 MG/ML SOLN
100.0000 mL | Freq: Once | INTRAVENOUS | Status: AC | PRN
  Administered 2015-01-19: 100 mL via INTRAVENOUS

## 2015-01-19 MED ORDER — LORAZEPAM 2 MG/ML IJ SOLN
1.0000 mg | Freq: Once | INTRAMUSCULAR | Status: AC
  Administered 2015-01-19: 1 mg via INTRAVENOUS
  Filled 2015-01-19: qty 1

## 2015-01-19 MED ORDER — SODIUM CHLORIDE 0.9 % IV BOLUS (SEPSIS)
1000.0000 mL | Freq: Once | INTRAVENOUS | Status: AC
  Administered 2015-01-19: 1000 mL via INTRAVENOUS

## 2015-01-19 MED ORDER — LORAZEPAM 1 MG PO TABS: 1.0000 mg | ORAL_TABLET | Freq: Three times a day (TID) | ORAL | Status: DC | PRN

## 2015-01-19 NOTE — Progress Notes (Signed)
Pre visit review using our clinic review tool, if applicable. No additional management support is needed unless otherwise documented below in the visit note. 

## 2015-01-19 NOTE — ED Notes (Signed)
Patient placed in fast track while waiting for ride & instructed to wait for transportation

## 2015-01-19 NOTE — Telephone Encounter (Signed)
I notified pt of the d-dimer elevation and positive finding. I advised her to got our ED in [facility]. Explained importance of further evaluation today. Pt expressed understanding and she agreed would go now.

## 2015-01-19 NOTE — Assessment & Plan Note (Addendum)
Hx of this and sometimes tachycardic on exam today. Will refer back to cardiology for holter. Pt symptoms of intermittent shortness of breath and mild tachcardia at times may represent occasional psvt.

## 2015-01-19 NOTE — ED Notes (Signed)
Instructed patient to call for a ride home since she is lethargic, patient verbalized she would call for a ride home and would not drive.

## 2015-01-19 NOTE — Assessment & Plan Note (Addendum)
Will get stat cxr due to hx of Pneumothorax. And get d-dimer. If D-dimer elevated then she would need ED eval or outpatient CT of chest.  Advised her to use qvar. This seemed to help this am. I decided to not rx albuterol as she has mild tachcyadia at time. Hx of svt. And I am referring for eval for possible paroxysmal svt. So I thought albuterol could possibly induce tachycardia. And by pulmonary opinion not clear needs albuterol

## 2015-01-19 NOTE — Discharge Instructions (Signed)
As discussed, your evaluation today has been largely reassuring.  But, it is important that you monitor your condition carefully, and do not hesitate to return to the ED if you develop new, or concerning changes in your condition.  Your pain and shortness of breath are likely due to ongoing anemia and scar tissue from your prior surgical procedure.  Today, there is no evidence of new blood clots in your lungs.  It is very important that you follow-up with your primary care physician for further evaluation and management.   Chest Pain (Nonspecific) It is often hard to give a diagnosis for the cause of chest pain. There is always a chance that your pain could be related to something serious, such as a heart attack or a blood clot in the lungs. You need to follow up with your doctor. HOME CARE  If antibiotic medicine was given, take it as directed by your doctor. Finish the medicine even if you start to feel better.  For the next few days, avoid activities that bring on chest pain. Continue physical activities as told by your doctor.  Do not use any tobacco products. This includes cigarettes, chewing tobacco, and e-cigarettes.  Avoid drinking alcohol.  Only take medicine as told by your doctor.  Follow your doctor's suggestions for more testing if your chest pain does not go away.  Keep all doctor visits you made. GET HELP IF:  Your chest pain does not go away, even after treatment.  You have a rash with blisters on your chest.  You have a fever. GET HELP RIGHT AWAY IF:   You have more pain or pain that spreads to your arm, neck, jaw, back, or belly (abdomen).  You have shortness of breath.  You cough more than usual or cough up blood.  You have very bad back or belly pain.  You feel sick to your stomach (nauseous) or throw up (vomit).  You have very bad weakness.  You pass out (faint).  You have chills. This is an emergency. Do not wait to see if the problems will go  away. Call your local emergency services (911 in [place] Do not drive yourself to the hospital. MAKE SURE YOU:   Understand these instructions.  Will watch your condition.  Will get help right away if you are not doing well or get worse. Document Released: 05/08/2008 Document Revised: 11/25/2013 Document Reviewed: 05/08/2008 [facility] Patient Information 2015 [employer] [place]. This information is not intended to replace advice given to you by your health care provider. Make sure you discuss any questions you have with your health care provider.

## 2015-01-19 NOTE — Assessment & Plan Note (Addendum)
Will order dexa scan.  And go ahead and refer to orthopedist. Xray states most likely related to osteopenia. Pt last alk phosphatase was normal level.

## 2015-01-19 NOTE — Telephone Encounter (Signed)
[facility] with stat d-dimer at 1.96. LM on Edward's voice mail with a request to call back so that we know that he received the message.  Notified PCP.

## 2015-01-19 NOTE — ED Provider Notes (Signed)
CSN: [id number]     Arrival date & time 01/19/15  1540 History   First MD Initiated Contact with Patient 01/19/15 1600     Chief Complaint  Patient presents with  . Shortness of Breath     HPI  Patient presents with concern of dyspnea, chest pain. Patient has had similar episodes for perhaps 2 months, been seen multiple times by multiple providers, but today and yesterday the symptoms were more pronounced. There is an anterior pressure sensation with dyspnea, as well as pain, sharp with deep inspiration. Symptoms also seem to be pronounced with cold air exposure. No report of new fever, though the patient does acknowledge nightly sweats. 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 Today the patient saw her primary care team, labs were notable for elevated D dimer. She was sent here for evaluation.   Past Medical History  Diagnosis Date  . Hypertension   . Depression   . Migraine   . Irregular heart rhythm     unclear what arrhythmia  . Pneumothorax     pneumonia  . Torus palatinus   . Supraventricular tachycardia, paroxysmal   . Anxiety   . GERD (gastroesophageal reflux disease)   . Anemia   . SVT (supraventricular tachycardia)   . H. pylori infection    Past Surgical History  Procedure Laterality Date  . Cesarean section      x 2  . Abdominal hysterectomy  2005  . Pleural scarification     Family History  Problem Relation Age of Onset  . Depression Mother   . Diabetes Mother   . Heart disease Mother   . Hypertension Mother   . Arthritis Mother   . Mental illness Mother   . Hypertension Father   . Coronary artery disease Father   . Heart disease Father   . Heart failure Brother   . Heart disease Brother   . Hypertension Brother     x2  . Hypertension Sister   . Stomach cancer Neg Hx    History  Substance Use Topics  . Smoking status: Never Smoker   . Smokeless tobacco: Never Used  . Alcohol Use: No   OB History    Gravida  Para Term Preterm AB TAB SAB Ectopic Multiple Living   2 2             Review of Systems  Constitutional:       Per HPI, otherwise negative  HENT:       Per HPI, otherwise negative  Respiratory:       Per HPI, otherwise negative  Cardiovascular:       Per HPI, otherwise negative  Gastrointestinal: Negative for vomiting.  Endocrine:       Negative aside from HPI  Genitourinary:       Neg aside from HPI   Musculoskeletal:       Per HPI, otherwise negative  Skin: Negative.   Neurological: Negative for syncope.      Allergies  Review of patient's allergies indicates no known allergies.  Home Medications   Prior to Admission medications   Medication Sig Start Date End Date Taking? Authorizing Provider  Ferrous Fumarate-Folic Acid (HEMOCYTE-F) 324-1 MG TABS Take 1 tablet by mouth daily. 11/05/14   Bradd CanaryStacey A Blyth, MD  fluconazole (DIFLUCAN) 200 MG tablet Take 400 mg (2 tabs) x 1 day, then 200 mg (1 tab) x 13 days Patient not taking: Reported on 01/19/2015 12/24/14  Beverley Fiedler, MD  HYDROcodone-acetaminophen (NORCO) 5-325 MG per tablet Take 1 tablet by mouth every 4 (four) hours as needed for moderate pain. 12/22/14   Nyoka Cowden, MD  hydrocortisone (ANUSOL-HC) 25 MG suppository Place 1 suppository (25 mg total) rectally 2 (two) times daily. 12/15/14   Waldon Merl, PA-C  loratadine (CLARITIN) 10 MG tablet Take 1 tablet (10 mg total) by mouth as needed. 05/19/13   Sandford Craze, NP  omeprazole (PRILOSEC) 20 MG capsule Take 1 capsule (20 mg total) by mouth 2 (two) times daily before a meal. Patient not taking: Reported on 01/19/2015 12/15/14   Waldon Merl, PA-C  ranitidine (ZANTAC) 150 MG capsule Take 150 mg by mouth 2 (two) times daily.    Historical Provider, MD  sucralfate (CARAFATE) 1 GM/10ML suspension Take 10 mLs (1 g total) by mouth 4 (four) times daily -  with meals and at bedtime. Prn pain, dyspepsia Patient not taking: Reported on 01/19/2015 11/03/14   Bradd Canary, MD   BP 132/84 mmHg  Pulse 116  Temp(Src) 100.3 F (37.9 C) (Oral)  Resp 18  Ht  (1.778 m)  Wt 144 lb (65.318 kg)  BMI 20.66 kg/m2  SpO2 98% Physical Exam  Constitutional: She is oriented to person, place, and time. She appears well-developed and well-nourished. No distress.  HENT:  Head: Normocephalic and atraumatic.  Eyes: Conjunctivae and EOM are normal.  Cardiovascular: Normal rate and regular rhythm.   Pulmonary/Chest: Effort normal and breath sounds normal. No stridor. Tachypnea noted. She has no wheezes.  Abdominal: She exhibits no distension.  Musculoskeletal: She exhibits no edema.  No appreciable asymmetry or deformities in the extremities  Neurological: She is alert and oriented to person, place, and time. No cranial nerve deficit.  Skin: Skin is warm and dry.  Psychiatric: Her mood appears anxious.  Nursing note and vitals reviewed.   ED Course  Procedures (including critical care time) Labs Review Labs Reviewed  CBC WITH DIFFERENTIAL/PLATELET  COMPREHENSIVE METABOLIC PANEL  TROPONIN I    Imaging Review No results found.   EKG Interpretation   Date/Time:  Tuesday January 19 2015 15:48:52 EST Ventricular Rate:  116 PR Interval:  150 QRS Duration: 92 QT Interval:  324 QTC Calculation: 450 R Axis:   116 Text Interpretation:  Sinus tachycardia Incomplete right bundle branch  block Left posterior fascicular block Septal infarct , age undetermined  Cannot rule out Inferior infarct , age undetermined Abnormal ECG Sinus  tachycardia Non-specific intra-ventricular conduction delay T wave  abnormality s1q3T3 pattern Abnormal ekg Confirmed by Gerhard Munch  MD  [phone]) on 01/19/2015 4:39:37 PM     Cardiac 110 sinus tach abnormal Pulse oximetry 99% room air normal   On repeat exam the patient is in no distress.  Since improved. She is aware of all findings. Specifically we discussed her anemia, likelihood of pleuritic pain secondary to  scarring. MDM   Patient presents with ongoing chest pain, dyspnea. History the patient is awake and alert, appropriately interactive. No CT evidence for pneumonia, pleural effusion. Patient's EKG does not have overt evidence for ongoing coronary ischemia. Patient's evaluation is most notable for demonstration of persistent anemia, consistent with iron deficiency.  Patient was encouraged to follow-up with primary care, have iron supplementation, discharged in stable condition.    Gerhard Munch, MD 01/19/15 [id number]

## 2015-01-19 NOTE — Assessment & Plan Note (Signed)
Will get cbc today. Pt is on iron

## 2015-01-19 NOTE — Patient Instructions (Addendum)
Other chest pain Will get ekg today. Pain atypical and present for one 1 wk. Will get troponin.    Dyspnea Will get stat cxr due to hx of Pneumothorax. And get d-dimer. If D-dimer elevated then she would need ED eval or outpatient CT of chest.   Fatigue Will get cbc today. Pt is on iron   Compression fracture of body of thoracic vertebra Will order dexa scan. May order mri or refer to orthopedist.   SVT (supraventricular tachycardia) Hx of this and sometimes tachycardic on exam today. Will refer back to cardiology for holter.    Note also regarding opacity of the chest. I do plan to order ct of the chest in march based on radiologist recommendations  Please give me name and number of the surgeon who evaluated you. Want to know his thought process.  Continue with the qvar.  I want you to go schedule follow up appointment here in 2 wks with whoever you consider to be your pcp. This is very important.  If your pain worsens or changes then ED evaluation.  At time note done d-dimer results not in.

## 2015-01-19 NOTE — ED Notes (Signed)
Sob for months. She was seen by her MD this am and had blood work and EKG. She was called and told her D dimer was 1.96 and she was told to come to the ED.

## 2015-01-19 NOTE — Telephone Encounter (Signed)
Spoke with Ramon DredgeEdward who contact the patient and [facility] Spoke with charge nurse and advised per labs and symptoms over last several months.

## 2015-01-19 NOTE — Assessment & Plan Note (Addendum)
Will get ekg today. Pain atypical and present for one 1 wk. Will get troponin.   Not I reviewed various ekg in the past. All of the v leads look very similar. No acute changes that are obvious.  1 set of troponin was negative.

## 2015-01-19 NOTE — Progress Notes (Signed)
Subjective:    Patient ID: [name], female    DOB: 1961/06/25, 54 y.o.   MRN: [id number]  HPI  Pt in she states she has some discomfort when breathing. Example other day when walking in cold weather she felt like she could not breath. Pt used inhaler that pulmonologist gave her. She used  qvar and she felt like it helped. But pulmonologist did not think she had asthma. Pt has sob easily induced with cold weather or weather changes. Also makes note that when she sits near air vent and temp changes she will not sob. With this sob she reports vague left upper back discort and transient chest mild pain. She states this has waxed and waned for 1 wk. This am was worse. Then impoved by time I saw her. By end of the exam much better.She reported no jaw pain, no arm pain, no diarphoresis.  Pt states pulmonologist told her not to get any further ct of her chest(He told her she should not get any further per her report). This differs from the radiologist opinion.  Pt does not report any obvious daily back pain. But speculates maybe some pain in her back early on around the first time I saw her but she did not describe any mid tspine pain.  Pt has fatigue and history of anemia. Pt has seen Gi and colonoscopy has been done and EGD done. Pt has been to [facility]. Surgeon did not think surgery needs to be done. This upset her. I reviewed both colonoscocpy and GI studies and no severe abnormalities. So pt states it is  unclear why she  was referred to surgeon.  Pt had possible cyst on the CT abdomen. And mesenteric inclusion cyst. Neoplastic disease was felt as very unlikely per radiologist read.  Pt states she misses work since December 11th.  Pt was about to go back on Monday.  She describes some anxiety and stressful job.  Pt had tspine compression fracture of her spine on xray.(This was seen on prior cxr)   Review of Systems  Constitutional: Positive for fatigue.  HENT: Negative.     Respiratory: Positive for shortness of breath. Negative for cough, chest tightness and wheezing.        Faint constant but on and off chest discomfort for one week.  Cardiovascular: Positive for chest pain. Negative for palpitations.       Very atypical pain x 1 wk waxing and waning in severity.  Gastrointestinal: Positive for abdominal pain. Negative for nausea, vomiting, diarrhea, constipation and blood in stool.       Recently resolved abdominal pain.  Musculoskeletal: Negative for myalgias, back pain, joint swelling and neck stiffness.  Neurological: Negative for dizziness, seizures, syncope, facial asymmetry, speech difficulty, weakness, light-headedness and numbness.  Hematological: Negative for adenopathy. Does not bruise/bleed easily.  Psychiatric/Behavioral:       Seems overall anxious and frustrated.   Expresses some concern about her new job/role.   Past Medical History  Diagnosis Date  . Hypertension   . Depression   . Migraine   . Irregular heart rhythm     unclear what arrhythmia  . Pneumothorax     pneumonia  . Torus palatinus   . Supraventricular tachycardia, paroxysmal   . Anxiety   . GERD (gastroesophageal reflux disease)   . Anemia   . SVT (supraventricular tachycardia)   . H. pylori infection     History   Social History  . Marital Status: Legally Separated  Spouse Name: N/A  . Number of Children: 2  . 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 Smoking status: Never Smoker   . Smokeless tobacco: Never Used  . Alcohol Use: No  . Drug Use: No  . Sexual Activity: Yes   Other Topics Concern  . Not on file   Social History Narrative    Past Surgical History  Procedure Laterality Date  . Cesarean section      x 2  . Abdominal hysterectomy  2005  . Pleural scarification      Family History  Problem Relation Age of Onset  . Depression Mother   . Diabetes Mother    . Heart disease Mother   . Hypertension Mother   . Arthritis Mother   . Mental illness Mother   . Hypertension Father   . Coronary artery disease Father   . Heart disease Father   . Heart failure Brother   . Heart disease Brother   . Hypertension Brother     x2  . Hypertension Sister   . Stomach cancer Neg Hx     No Known Allergies  Current Outpatient Prescriptions on File Prior to Visit  Medication Sig Dispense Refill  . Ferrous Fumarate-Folic Acid (HEMOCYTE-F) 324-1 MG TABS Take 1 tablet by mouth daily. 30 each 2  . HYDROcodone-acetaminophen (NORCO) 5-325 MG per tablet Take 1 tablet by mouth every 4 (four) hours as needed for moderate pain. 30 tablet 0  . hydrocortisone (ANUSOL-HC) 25 MG suppository Place 1 suppository (25 mg total) rectally 2 (two) times daily. 12 suppository 0  . loratadine (CLARITIN) 10 MG tablet Take 1 tablet (10 mg total) by mouth as needed. 30 tablet 2  . fluconazole (DIFLUCAN) 200 MG tablet Take 400 mg (2 tabs) x 1 day, then 200 mg (1 tab) x 13 days (Patient not taking: Reported on 01/19/2015) 15 tablet 0  . omeprazole (PRILOSEC) 20 MG capsule Take 1 capsule (20 mg total) by mouth 2 (two) times daily before a meal. (Patient not taking: Reported on 01/19/2015) 60 capsule 1  . sucralfate (CARAFATE) 1 GM/10ML suspension Take 10 mLs (1 g total) by mouth 4 (four) times daily -  with meals and at bedtime. Prn pain, dyspepsia (Patient not taking: Reported on 01/19/2015) 420 mL 1   No current facility-administered medications on file prior to visit.    BP 123/83 mmHg  Pulse 98  Temp(Src) 97.7 F (36.5 C) (Oral)  Ht  (1.778 m)  Wt 144 lb 9.6 oz (65.59 kg)  BMI 20.75 kg/m2  SpO2 99%      Objective:   Physical Exam   General Mental Status- Alert. General Appearance- Not in acute distress.   Skin General: Color- Normal Color. Moisture- Normal Moisture.  Neck Carotid Arteries- Normal color. Moisture- Normal Moisture. No carotid bruits. No JVD. No  tracheal deviation.  Anterior chest- no pain on palpation.  Chest and Lung Exam Auscultation: Breath Sounds:-Normal.  Cardiovascular Auscultation:Rythm- Regular. Murmurs & Other Heart Sounds:Auscultation of the heart reveals- No Murmurs.  Abdomen Inspection:-Inspeection Normal. Palpation/Percussion:Note:No mass. Palpation and Percussion of the abdomen reveal- Non Tender, Non Distended + BS, no rebound or guarding.    Neurologic Cranial Nerve exam:- CN III-XII intact(No nystagmus), symmetric smile. Drift Test:- No drift. Romberg Exam:- Negative.  Heal to Toe Gait exam:-Normal. Finger to Nose:- Normal/Intact Strength:- 5/5 equal and symmetric strength both upper and lower extremities.  Lower ext- negative homans signs. Bilateral. No edema.        Assessment & Plan:  Pt on ambulating o2 sat dropped to 93%. Pulse went to 113. Then on rest o2 sat 95%. But pt has cold hands and sometimes moniitor would not register.   Ekg compared to prior 2015 and 2013 looks very similar. Particularly in v leads.

## 2015-01-20 NOTE — Telephone Encounter (Signed)
Please arrange ed follow up of anemia.

## 2015-01-20 NOTE — Telephone Encounter (Signed)
Mailbox full, will try again later

## 2015-01-22 NOTE — Telephone Encounter (Signed)
Phone not in service.

## 2015-01-23 NOTE — Telephone Encounter (Signed)
Please mail letter.

## 2015-01-25 NOTE — Telephone Encounter (Signed)
Letter mailed

## 2015-02-01 ENCOUNTER — Telehealth: Payer: Self-pay | Admitting: Medical

## 2015-02-01 NOTE — Telephone Encounter (Signed)
Opened to review imaging studies.

## 2015-02-04 ENCOUNTER — Encounter: Payer: Self-pay | Admitting: Physician Assistant

## 2015-02-04 ENCOUNTER — Telehealth: Payer: Self-pay | Admitting: Medical

## 2015-02-04 ENCOUNTER — Encounter: Payer: BC Managed Care – PPO | Admitting: Physician Assistant

## 2015-02-04 NOTE — Progress Notes (Deleted)
Cardiology Office Note   Date:  02/04/2015   ID:  Renee BoardsCynthia Bialas-Sharpe, DOB [birth year], MRN [id number]  PCP:  Lemont Fillers'SULLIVAN,MELISSA S., NP  Cardiologist:  Dr. Olga MillersBrian Crenshaw     No chief complaint on file.    History of Present Illness: [name] is a 54 y.o. female with a hx of HTN, palpitations, SVT, remote pneumothorax.  Evaluated by Dr. Olga MillersBrian Crenshaw 12/2012 for bradycardia.  PRN FU was recommended.  Recently seen by PCP for chest pain, dyspnea and palpitations.  She has recently seen Pulmonology and GI.  She has microcytic anemia.  She has been treated for GERD.  EGD demonstrated candidiasis that was also treated. Colo was normal.  She is not felt to have asthma.  Dr. Sherene SiresWert recommended DC'ing all inhalers.  When she saw PCP, DDimer was elevated.  CTA was neg for PE.  There was patchy airspace disease in the right posterior apex (atypical pneumonia? Tb?), 1cm RLL nodule (unchanged).    ***  Studies/Reports Reviewed Today:   Chest CTA 01/19/15 IMPRESSION: Negative for pulmonary embolism.  Patchy airspace disease in the right posterior apex is slightly improved from the prior CT. This may represent an area of persistent infection such as atypical pneumonia. Tuberculosis not excluded.  Correlate with symptoms. Follow-up CT recommended in 3 months to evaluate for interval change. There has been a prior biopsy of the right upper lobe in 2001 revealing benign findings.  1 cm right lower lobe nodule unchanged. Right medial lung base pleural thickening is unchanged and likely a scarring. There is scarring in both lung apices.  Echocardiogram 05/11/11 (done @ [facility]) EF 50-55%, mild diastolic dysfunction Mild LAE Mild MR  Event Monitor (2012 @ [facility]) PVCs/PACs, 3 episodes of WCT (SVT with aberrancy)  Myoview 05/2011 Diaphragmatic attenuation, no ischemia or scar; low risk    Past Medical History  Diagnosis Date  . Hypertension   . Depression   . Migraine   . Irregular  heart rhythm     unclear what arrhythmia  . Pneumothorax     pneumonia  . Torus palatinus   . Supraventricular tachycardia, paroxysmal   . Anxiety   . GERD (gastroesophageal reflux disease)   . Anemia   . SVT (supraventricular tachycardia)   . H. pylori infection     Past Surgical History  Procedure Laterality Date  . Cesarean section      x 2  . Abdominal hysterectomy  2005  . Pleural scarification       Current Outpatient Prescriptions  Medication Sig Dispense Refill  . Ferrous Fumarate-Folic Acid (HEMOCYTE-F) 324-1 MG TABS Take 1 tablet by mouth daily. 30 each 2  . fluconazole (DIFLUCAN) 200 MG tablet Take 400 mg (2 tabs) x 1 day, then 200 mg (1 tab) x 13 days (Patient not taking: Reported on 01/19/2015) 15 tablet 0  . HYDROcodone-acetaminophen (NORCO) 5-325 MG per tablet Take 1 tablet by mouth every 4 (four) hours as needed for moderate pain. 30 tablet 0  . hydrocortisone (ANUSOL-HC) 25 MG suppository Place 1 suppository (25 mg total) rectally 2 (two) times daily. 12 suppository 0  . loratadine (CLARITIN) 10 MG tablet Take 1 tablet (10 mg total) by mouth as needed. 30 tablet 2  . LORazepam (ATIVAN) 1 MG tablet Take 1 tablet (1 mg total) by mouth 3 (three) times daily as needed for anxiety. 15 tablet 0  . ranitidine (ZANTAC) 150 MG capsule Take 150 mg by mouth 2 (two) times daily.    .Marland Kitchen  sucralfate (CARAFATE) 1 GM/10ML suspension Take 10 mLs (1 g total) by mouth 4 (four) times daily -  with meals and at bedtime. Prn pain, dyspepsia (Patient not taking: Reported on 01/19/2015) 420 mL 1  . [DISCONTINUED] omeprazole (PRILOSEC) 20 MG capsule Take 1 capsule (20 mg total) by mouth 2 (two) times daily before a meal. (Patient not taking: Reported on 01/19/2015) 60 capsule 1   No current facility-administered medications for this visit.    Allergies:   Review of patient's allergies indicates no known allergies.    Social History:  The patient  reports that she has never smoked. She has  never used smokeless tobacco. She reports that she does not drink alcohol or use illicit drugs.   Family History:  The patient's ***family history includes Arthritis in her mother; Coronary artery disease in her father; Depression in her mother; Diabetes in her mother; Heart disease in her brother, father, and mother; Heart failure in her brother; Hypertension in her brother, father, mother, and sister; Mental illness in her mother. There is no history of Stomach cancer.    ROS:   Please see the history of present illness.   ROS    PHYSICAL EXAM: VS:  There were no vitals taken for this visit.    Wt Readings from Last 3 Encounters:  01/19/15 144 lb (65.318 kg)  01/19/15 144 lb 9.6 oz (65.59 kg)  12/29/14 150 lb (68.04 kg)     GEN: Well nourished, well developed, in no acute distress HEENT: normal Neck: *** JVD, ***carotid bruits, no masses Cardiac:  Normal S1/S2, ***RRR; *** murmur ***, *** no rubs or gallops, {NUMBERS; 1+ TO 4+, TRACE/RARE:14493} edema  Respiratory:  ***clear to auscultation bilaterally, no wheezing, rhonchi or rales. GI: ***soft, nontender, nondistended, + BS MS: no deformity or atrophy Skin: warm and dry  Neuro:  CNs II-XII intact, Strength and sensation are intact Psych: Normal affect   EKG:  EKG {ACTION; IS/IS VWU:98119147} ordered today.  It demonstrates:   ***   Recent Labs: 01/19/2015: ALT 31; BUN 14; Creatinine 0.64; Hemoglobin 9.2*; Platelets 458*; Potassium 3.5; Sodium 138    Lipid Panel    Component Value Date/Time   CHOL 191 01/19/2010 1938   TRIG 46 01/19/2010 1938   HDL 58 01/19/2010 1938   CHOLHDL 3.3 Ratio 01/19/2010 1938   VLDL 9 01/19/2010 1938   LDLCALC 124* 01/19/2010 1938      ASSESSMENT AND PLAN:  1.  Chest pain  SVT (supraventricular tachycardia)  Essential hypertension  Gastroesophageal reflux disease without esophagitis     Current medicines are reviewed at length with the patient today.  The patient {ACTIONS;  HAS/DOES NOT HAVE:19233} concerns regarding medicines.  The following changes have been made:  {PLAN; NO CHANGE:13088:s}  Labs/ tests ordered today include: *** No orders of the defined types were placed in this encounter.     Disposition:   FU with *** in {gen number 8-29:562130} {TIME; UNITS DAY/WEEK/MONTH:19136}   Signed, Brynda Rim, MHS 02/04/2015 8:56 AM    [facility] [address] [place], [place], [place]  [address] Phone: [phone]; Fax: [phone]

## 2015-02-05 ENCOUNTER — Telehealth: Payer: Self-pay

## 2015-02-05 NOTE — Telephone Encounter (Signed)
Open by error

## 2015-02-05 NOTE — Telephone Encounter (Signed)
[employer] Disability paperwork received.  Billing sheet attached.  Forms placed in Melissa's red folder for review and completion.

## 2015-02-08 NOTE — Progress Notes (Signed)
This encounter was created in error - please disregard.

## 2015-02-12 ENCOUNTER — Telehealth: Payer: Self-pay | Admitting: Family

## 2015-02-12 NOTE — Telephone Encounter (Signed)
Left message on cell# to return my call. 

## 2015-02-12 NOTE — Telephone Encounter (Signed)
I received pt's FMLA request.  [facility]  Why was she out of work from 1/20-2/17?

## 2015-02-15 NOTE — Telephone Encounter (Signed)
Spoke with pt. She states additional paperwork is for date already listed on original FMLA. Notes that she was 3 days over the allowed time for short term disability and employer is requesting additional form be completed. She was not out of work past 12/07/14 listed on initial FMLA. Previous forms completed and forwarded to Provider along with new form.  Please advise.

## 2015-02-16 NOTE — Telephone Encounter (Signed)
Form signed.

## 2015-02-16 NOTE — Telephone Encounter (Signed)
Notified pt form completed and she requests that we fax it to employer at # on form [phone]). Form faxed.

## 2015-02-18 NOTE — Telephone Encounter (Signed)
Please see telephone encounter dated 02/12/15.

## 2015-03-07 NOTE — Progress Notes (Deleted)
Cardiology Office Note   Date:  03/07/2015   ID:  [name], DOB 12-14-1960, MRN [id number]  PCP:  Lemont Fillers., NP  Cardiologist:  ***    Chief Complaint  Patient presents with  . SVT     History of Present Illness: [name] is a 54 y.o. female with a hx of SVT.  She was seen years ago by Dr. Bryan Lemma.  Last seen by Dr. Olga Millers 12/2012.  She has a hx of iron deficiency anemia.  Patient seen by GI and Pulmonary for abdominal and chest pain earlier this year.  Colo and EGD were unrevealing aside from candida esophagitis.  There was an abdominal mass noted on CT and she was referred to surgery.  Notes from PCP indicate surgery was not indicated.    ***  Studies/Reports Reviewed Today:  Echo 6/12 EF 50%, mild diast dysfn Mild LAE Mild MR  Nuclear 6/12 Diaphragmatic attenuation No ischemia or infarct Low Risk  Event Monitor 5/12 NSR PVCs with bigeminy and trigeminy 3 episodes of WCT - SVT with aberrancy vs NSVT   Past Medical History  Diagnosis Date  . Hypertension   . Depression   . Migraine   . Irregular heart rhythm     unclear what arrhythmia  . Pneumothorax     pneumonia  . Torus palatinus   . Supraventricular tachycardia, paroxysmal     a. visit to EF 03/2011;  b. Event monitor ( ) 6/12:  PVCs/PACs, 3 episodes of WCT (SVT with aberrancy)  . Anxiety   . GERD (gastroesophageal reflux disease)   . Anemia   . H. pylori infection   . Hx of cardiovascular stress test     Myoview 6/12:  diaph atten, no ischemia, low risk  . Hx of echocardiogram     Echo 6/12:  EF 50-55%, mild diast dysfn, mild LAE, mild MR    Past Surgical History  Procedure Laterality Date  . Cesarean section      x 2  . Abdominal hysterectomy  2005  . Pleural scarification       Current Outpatient Prescriptions  Medication Sig Dispense Refill  . Ferrous Fumarate-Folic Acid (HEMOCYTE-F) 324-1 MG TABS Take 1 tablet by mouth daily. 30  each 2  . fluconazole (DIFLUCAN) 200 MG tablet Take 400 mg (2 tabs) x 1 day, then 200 mg (1 tab) x 13 days (Patient not taking: Reported on 01/19/2015) 15 tablet 0  . HYDROcodone-acetaminophen (NORCO) 5-325 MG per tablet Take 1 tablet by mouth every 4 (four) hours as needed for moderate pain. 30 tablet 0  . hydrocortisone (ANUSOL-HC) 25 MG suppository Place 1 suppository (25 mg total) rectally 2 (two) times daily. 12 suppository 0  . loratadine (CLARITIN) 10 MG tablet Take 1 tablet (10 mg total) by mouth as needed. 30 tablet 2  . LORazepam (ATIVAN) 1 MG tablet Take 1 tablet (1 mg total) by mouth 3 (three) times daily as needed for anxiety. 15 tablet 0  . ranitidine (ZANTAC) 150 MG capsule Take 150 mg by mouth 2 (two) times daily.    . sucralfate (CARAFATE) 1 GM/10ML suspension Take 10 mLs (1 g total) by mouth 4 (four) times daily -  with meals and at bedtime. Prn pain, dyspepsia (Patient not taking: Reported on 01/19/2015) 420 mL 1  . [DISCONTINUED] omeprazole (PRILOSEC) 20 MG capsule Take 1 capsule (20 mg total) by mouth 2 (two) times daily before a meal. (Patient not taking: Reported on 01/19/2015) 60 capsule 1  No current facility-administered medications for this visit.    Allergies:   Review of patient's allergies indicates no known allergies.    Social History:  The patient  reports that she has never smoked. She has never used smokeless tobacco. She reports that she does not drink alcohol or use illicit drugs.   Family History:  The patient's ***family history includes Arthritis in her mother; Coronary artery disease in her father; Depression in her mother; Diabetes in her mother; Heart disease in her brother, father, and mother; Heart failure in her brother; Hypertension in her brother, father, mother, and sister; Mental illness in her mother. There is no history of Stomach cancer.    ROS:   Please see the history of present illness.   ROS    PHYSICAL EXAM: VS:  There were no vitals  taken for this visit.    Wt Readings from Last 3 Encounters:  01/19/15 144 lb (65.318 kg)  01/19/15 144 lb 9.6 oz (65.59 kg)  12/29/14 150 lb (68.04 kg)     GEN: Well nourished, well developed, in no acute distress HEENT: normal Neck: *** JVD, ***carotid bruits, no masses Cardiac:  Normal S1/S2, ***RRR; *** murmur ***, *** no rubs or gallops, {NUMBERS; 1+ TO 4+, TRACE/RARE:14493} edema  Respiratory:  ***clear to auscultation bilaterally, no wheezing, rhonchi or rales. GI: ***soft, nontender, nondistended, + BS MS: no deformity or atrophy Skin: warm and dry  Neuro:  CNs II-XII intact, Strength and sensation are intact Psych: Normal affect   EKG:  EKG {ACTION; IS/IS ZOX:[id number]}OT:[id number]} ordered today.  It demonstrates:   ***   Recent Labs: 01/19/2015: ALT 31; BUN 14; Creatinine 0.64; Hemoglobin 9.2*; Platelets 458*; Potassium 3.5; Sodium 138    Lipid Panel    Component Value Date/Time   CHOL 191 01/19/2010 1938   TRIG 46 01/19/2010 1938   HDL 58 01/19/2010 1938   CHOLHDL 3.3 Ratio 01/19/2010 1938   VLDL 9 01/19/2010 1938   LDLCALC 124* 01/19/2010 1938      ASSESSMENT AND PLAN:  No diagnosis found. ***   Current medicines are reviewed at length with the patient today.  The patient {ACTIONS; HAS/DOES NOT HAVE:19233} concerns regarding medicines.  The following changes have been made:  {PLAN; NO CHANGE:13088:s}  Labs/ tests ordered today include: *** No orders of the defined types were placed in this encounter.     Disposition:   FU with ***   Signed, Tereso NewcomerScott Weaver, PA-C, MHS 03/07/2015 10:17 PM    [facility] [address], [place], [place]  [address] Phone: [phone]; Fax: [phone]

## 2015-03-08 ENCOUNTER — Encounter: Payer: BC Managed Care – PPO | Admitting: Physician Assistant

## 2015-03-08 NOTE — Progress Notes (Signed)
This encounter was created in error - please disregard.

## 2015-03-24 ENCOUNTER — Ambulatory Visit (INDEPENDENT_AMBULATORY_CARE_PROVIDER_SITE_OTHER): Payer: BC Managed Care – PPO | Admitting: Family

## 2015-03-24 ENCOUNTER — Encounter: Payer: Self-pay | Admitting: Family

## 2015-03-24 VITALS — BP 110/82 | HR 95 | Temp 98.2°F | Resp 16 | Ht 70.0 in | Wt 142.4 lb

## 2015-03-24 DIAGNOSIS — N390 Urinary tract infection, site not specified: Secondary | ICD-10-CM | POA: Diagnosis not present

## 2015-03-24 DIAGNOSIS — R350 Frequency of micturition: Secondary | ICD-10-CM | POA: Diagnosis not present

## 2015-03-24 LAB — POCT URINALYSIS DIPSTICK
BILIRUBIN UA: NEGATIVE
Glucose, UA: NEGATIVE
Ketones, UA: 5
NITRITE UA: NEGATIVE
Spec Grav, UA: >=1.03
Urobilinogen, UA: 0.2
pH, UA: 5

## 2015-03-24 MED ORDER — CIPROFLOXACIN HCL 500 MG PO TABS: 500.0000 mg | ORAL_TABLET | Freq: Two times a day (BID) | ORAL | Status: DC

## 2015-03-24 NOTE — Assessment & Plan Note (Signed)
UA + blood, trace leuks. Will obtain urine culture, rx with cipro. Follow up if symptoms worsen or if symptoms do not improve.

## 2015-03-24 NOTE — Patient Instructions (Signed)
Start cipro for UTI. Call if symptoms worsen or if not feeling better in 3 days.

## 2015-03-24 NOTE — Progress Notes (Signed)
Pre visit review using our clinic review tool, if applicable. No additional management support is needed unless otherwise documented below in the visit note. 

## 2015-03-24 NOTE — Progress Notes (Signed)
Subjective:    Patient ID: [name], female    DOB: 1961/02/26, 54 y.o.   MRN: [id number]  HPI  [name] is a 54 yr old female who presents today with complaint of dysuria and urinary frequency. Also reports lower abdominal pain and low back pain (across the back).  Has been drinking cranberry juice.  Helped temporarily.  Sxs started 1 month ago. Denies known fever but reports feeling "run down over the weekend." Denies gross hematuria.   Review of Systems    see HPI  Past Medical History  Diagnosis Date  . Hypertension   . Depression   . Migraine   . Irregular heart rhythm     unclear what arrhythmia  . Pneumothorax     pneumonia  . Torus palatinus   . Supraventricular tachycardia, paroxysmal     a. visit to EF 03/2011;  b. Event monitor ( ) 6/12:  PVCs/PACs, 3 episodes of WCT (SVT with aberrancy)  . Anxiety   . GERD (gastroesophageal reflux disease)   . Anemia   . H. pylori infection   . Hx of cardiovascular stress test     Myoview 6/12:  diaph atten, no ischemia, low risk  . Hx of echocardiogram     Echo 6/12:  EF 50-55%, mild diast dysfn, mild LAE, mild MR    History   Social History  . Marital Status: Legally Separated    Spouse Name: N/A  . Number of Children: 2  . 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 Smoking status: Never Smoker   . Smokeless tobacco: Never Used  . Alcohol Use: No  . Drug Use: No  . Sexual Activity: Yes   Other Topics Concern  . Not on file   Social History Narrative    Past Surgical History  Procedure Laterality Date  . Cesarean section      x 2  . Abdominal hysterectomy  2005  . Pleural scarification      Family History  Problem Relation Age of Onset  . Depression Mother   . Diabetes Mother   . Heart disease Mother   . Hypertension Mother   . Arthritis Mother   . Mental illness Mother   . Hypertension Father   .  Coronary artery disease Father   . Heart disease Father   . Heart failure Brother   . Heart disease Brother   . Hypertension Brother     x2  . Hypertension Sister   . Stomach cancer Neg Hx     No Known Allergies  Current Outpatient Prescriptions on File Prior to Visit  Medication Sig Dispense Refill  . Ferrous Fumarate-Folic Acid (HEMOCYTE-F) 324-1 MG TABS Take 1 tablet by mouth daily. 30 each 2  . ranitidine (ZANTAC) 150 MG capsule Take 150 mg by mouth 2 (two) times daily.    . [DISCONTINUED] omeprazole (PRILOSEC) 20 MG capsule Take 1 capsule (20 mg total) by mouth 2 (two) times daily before a meal. (Patient not taking: Reported on 01/19/2015) 60 capsule 1   No current facility-administered medications on file prior to visit.    BP 110/82 mmHg  Pulse 95  Temp(Src) 98.2 F (36.8 C) (Oral)  Resp 16  Ht  (1.778 m)  Wt 142 lb 6.4 oz (64.592 kg)  BMI 20.43 kg/m2  SpO2 99%    Objective:   Physical Exam  Constitutional: She appears well-developed  and well-nourished.  Cardiovascular: Normal rate, regular rhythm and normal heart sounds.   No murmur heard. Pulmonary/Chest: Effort normal and breath sounds normal. No respiratory distress. She has no wheezes.  Abdominal: Soft. She exhibits no distension. There is no tenderness. There is no rebound and no CVA tenderness.  Psychiatric: She has a normal mood and affect. Her behavior is normal. Judgment and thought content normal.          Assessment & Plan:

## 2015-03-26 LAB — URINE CULTURE

## 2015-04-14 ENCOUNTER — Telehealth: Payer: Self-pay | Admitting: Medical

## 2015-04-14 DIAGNOSIS — R9389 Abnormal findings on diagnostic imaging of other specified body structures: Secondary | ICD-10-CM

## 2015-04-14 NOTE — Telephone Encounter (Signed)
[facility] to [facility] regarding Ct reports and radiolgist recommendations.

## 2015-04-15 NOTE — Telephone Encounter (Signed)
Please contact pt and let her know that I reviewed her chart and her CT which was done 3 months ago and I recommend that she complete a follow up CT chest.  If still abnormal, I will have her follow up with Dr. Sherene SiresWert.

## 2015-04-16 NOTE — Telephone Encounter (Signed)
Notified pt. She states she is going to hold off on CT until she talks with Dr Sherene SiresWert as he told her "not to have any more CT scans". Pt states she will get back with us if she decides to proceed with CT.

## 2015-04-16 NOTE — Telephone Encounter (Signed)
Sent pt pcp message regarding pt prior ct and nodules. It is approaching 3 moths since last one was done. Will put in order for repeat ct. Will get lpn I work with to call pt and advise that this has been ordered.

## 2015-04-18 NOTE — Telephone Encounter (Signed)
Thank you for placing CT order.

## 2015-04-20 NOTE — Telephone Encounter (Signed)
Note sent to me by karen lpn stated pt aware of ct and she may get. She will let us know. Will send clarification to lpn. Did she talk to pt directly or did she this in chart.

## 2015-04-22 NOTE — Telephone Encounter (Signed)
Wanted to check to see if you saw this note from patient first. Just left message for patient to call me back regarding follow up CT.

## 2015-04-23 NOTE — Telephone Encounter (Signed)
Left 2 messages on patients answering machine no call back will try again.

## 2015-04-23 NOTE — Telephone Encounter (Signed)
Patient states she is not having a CT done at this time. States she has already talked to MaliMelissa O' sullivan about this.

## 2015-08-17 ENCOUNTER — Ambulatory Visit (INDEPENDENT_AMBULATORY_CARE_PROVIDER_SITE_OTHER): Payer: BC Managed Care – PPO | Admitting: Physician Assistant

## 2015-08-17 ENCOUNTER — Encounter: Payer: Self-pay | Admitting: Physician Assistant

## 2015-08-17 VITALS — BP 136/92 | HR 76 | Temp 98.6°F | Resp 16 | Ht 70.0 in | Wt 149.4 lb

## 2015-08-17 DIAGNOSIS — B9689 Other specified bacterial agents as the cause of diseases classified elsewhere: Secondary | ICD-10-CM

## 2015-08-17 DIAGNOSIS — J019 Acute sinusitis, unspecified: Secondary | ICD-10-CM | POA: Insufficient documentation

## 2015-08-17 MED ORDER — AMOXICILLIN-POT CLAVULANATE 875-125 MG PO TABS: 1.0000 | ORAL_TABLET | Freq: Two times a day (BID) | ORAL | Status: DC

## 2015-08-17 MED ORDER — BENZONATATE 100 MG PO CAPS: 100.0000 mg | ORAL_CAPSULE | Freq: Two times a day (BID) | ORAL | Status: DC | PRN

## 2015-08-17 NOTE — Progress Notes (Signed)
Patient presents to clinic today c/o sinus pressure, sinus pain, ear pressure, PND and sore throat. Endorses sinus headache and ear pain. Endorses fatigue and intermittent low-grade fever. Denies chest pain or SOB. Cough is productive of clear sputum. Denies recent travel. Denies sick contact.  Past Medical History  Diagnosis Date  . Hypertension   . Depression   . Migraine   . Irregular heart rhythm     unclear what arrhythmia  . Pneumothorax     pneumonia  . Torus palatinus   . Supraventricular tachycardia, paroxysmal     a. visit to EF 03/2011;  b. Event monitor ( ) 6/12:  PVCs/PACs, 3 episodes of WCT (SVT with aberrancy)  . Anxiety   . GERD (gastroesophageal reflux disease)   . Anemia   . H. pylori infection   . Hx of cardiovascular stress test     Myoview 6/12:  diaph atten, no ischemia, low risk  . Hx of echocardiogram     Echo 6/12:  EF 50-55%, mild diast dysfn, mild LAE, mild MR    Current Outpatient Prescriptions on File Prior to Visit  Medication Sig Dispense Refill  . Ferrous Fumarate-Folic Acid (HEMOCYTE-F) 324-1 MG TABS Take 1 tablet by mouth daily. (Patient not taking: Reported on 08/17/2015) 30 each 2  . [DISCONTINUED] omeprazole (PRILOSEC) 20 MG capsule Take 1 capsule (20 mg total) by mouth 2 (two) times daily before a meal. (Patient not taking: Reported on 01/19/2015) 60 capsule 1   No current facility-administered medications on file prior to visit.    No Known Allergies  Family History  Problem Relation Age of Onset  . Depression Mother   . Diabetes Mother   . Heart disease Mother   . Hypertension Mother   . Arthritis Mother   . Mental illness Mother   . Hypertension Father   . Coronary artery disease Father   . Heart disease Father   . Heart failure Brother   . Heart disease Brother   . Hypertension Brother     x2  . Hypertension Sister   . Stomach cancer Neg Hx     Social History   Social History  . Marital Status: Legally Separated      Spouse Name: N/A  . Number of Children: 2  . 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 Smoking status: Never Smoker   . Smokeless tobacco: Never Used  . Alcohol Use: No  . Drug Use: No  . Sexual Activity: Yes   Other Topics Concern  . None   Social History Narrative   Review of Systems - See HPI.  All other ROS are negative.  BP 136/92 mmHg  Pulse 76  Temp(Src) 98.6 F (37 C) (Oral)  Resp 16  Ht  (1.778 m)  Wt 149 lb 6 oz (67.756 kg)  BMI 21.43 kg/m2  SpO2 98%  Physical Exam  Constitutional: She is oriented to person, place, and time and well-developed, well-nourished, and in no distress.  HENT:  Head: Normocephalic and atraumatic.  Eyes: Conjunctivae are normal.  Neck: Neck supple.  Cardiovascular: Normal rate, regular rhythm, normal heart sounds and intact distal pulses.   Pulmonary/Chest: Effort normal and breath sounds normal. No respiratory distress. She has no wheezes. She has no rales. She exhibits no tenderness.  Neurological: She is alert and oriented to person, place, and time.  Skin: Skin is warm. No rash  noted.  Psychiatric: Affect normal.  Vitals reviewed.  Assessment/Plan: Acute bacterial sinusitis Rx Augmentin.  Increase fluids.  Rest.  Saline nasal spray.  Probiotic.  Mucinex as directed.  Humidifier in bedroom. Tessalon for cough.  Call or return to clinic if symptoms are not improving.

## 2015-08-17 NOTE — Progress Notes (Signed)
Pre visit review using our clinic review tool, if applicable. No additional management support is needed unless otherwise documented below in the visit note/SLS  

## 2015-08-17 NOTE — Patient Instructions (Signed)
Please take antibiotic as directed.  Increase fluid intake.  Use Saline nasal spray.  Take a daily multivitamin. Use Tessalon as directed.  Place a humidifier in the bedroom.  Please call or return clinic if symptoms are not improving.  Sinusitis Sinusitis is redness, soreness, and swelling (inflammation) of the paranasal sinuses. Paranasal sinuses are air pockets within the bones of your face (beneath the eyes, the middle of the forehead, or above the eyes). In healthy paranasal sinuses, mucus is able to drain out, and air is able to circulate through them by way of your nose. However, when your paranasal sinuses are inflamed, mucus and air can become trapped. This can allow bacteria and other germs to grow and cause infection. Sinusitis can develop quickly and last only a short time (acute) or continue over a long period (chronic). Sinusitis that lasts for more than 12 weeks is considered chronic.  CAUSES  Causes of sinusitis include:  Allergies.  Structural abnormalities, such as displacement of the cartilage that separates your nostrils (deviated septum), which can decrease the air flow through your nose and sinuses and affect sinus drainage.  Functional abnormalities, such as when the small hairs (cilia) that line your sinuses and help remove mucus do not work properly or are not present. SYMPTOMS  Symptoms of acute and chronic sinusitis are the same. The primary symptoms are pain and pressure around the affected sinuses. Other symptoms include:  Upper toothache.  Earache.  Headache.  Bad breath.  Decreased sense of smell and taste.  A cough, which worsens when you are lying flat.  Fatigue.  Fever.  Thick drainage from your nose, which often is green and may contain pus (purulent).  Swelling and warmth over the affected sinuses. DIAGNOSIS  Your caregiver will perform a physical exam. During the exam, your caregiver may:  Look in your nose for signs of abnormal growths in  your nostrils (nasal polyps).  Tap over the affected sinus to check for signs of infection.  View the inside of your sinuses (endoscopy) with a special imaging device with a light attached (endoscope), which is inserted into your sinuses. If your caregiver suspects that you have chronic sinusitis, one or more of the following tests may be recommended:  Allergy tests.  Nasal culture A sample of mucus is taken from your nose and sent to a lab and screened for bacteria.  Nasal cytology A sample of mucus is taken from your nose and examined by your caregiver to determine if your sinusitis is related to an allergy. TREATMENT  Most cases of acute sinusitis are related to a viral infection and will resolve on their own within 10 days. Sometimes medicines are prescribed to help relieve symptoms (pain medicine, decongestants, nasal steroid sprays, or saline sprays).  However, for sinusitis related to a bacterial infection, your caregiver will prescribe antibiotic medicines. These are medicines that will help kill the bacteria causing the infection.  Rarely, sinusitis is caused by a fungal infection. In theses cases, your caregiver will prescribe antifungal medicine. For some cases of chronic sinusitis, surgery is needed. Generally, these are cases in which sinusitis recurs more than 3 times per year, despite other treatments. HOME CARE INSTRUCTIONS   Drink plenty of water. Water helps thin the mucus so your sinuses can drain more easily.  Use a humidifier.  Inhale steam 3 to 4 times a day (for example, sit in the bathroom with the shower running).  Apply a warm, moist washcloth to your face 3 to   4 times a day, or as directed by your caregiver.  Use saline nasal sprays to help moisten and clean your sinuses.  Take over-the-counter or prescription medicines for pain, discomfort, or fever only as directed by your caregiver. SEEK IMMEDIATE MEDICAL CARE IF:  You have increasing pain or severe  headaches.  You have nausea, vomiting, or drowsiness.  You have swelling around your face.  You have vision problems.  You have a stiff neck.  You have difficulty breathing. MAKE SURE YOU:   Understand these instructions.  Will watch your condition.  Will get help right away if you are not doing well or get worse. Document Released: 11/20/2005 Document Revised: 02/12/2012 Document Reviewed: 12/05/2011 Exit[employer] Patient Information 2014 [employer].   

## 2015-08-17 NOTE — Assessment & Plan Note (Signed)
Rx Augmentin.  Increase fluids.  Rest.  Saline nasal spray.  Probiotic.  Mucinex as directed.  Humidifier in bedroom. Tessalon for cough.  Call or return to clinic if symptoms are not improving.  

## 2015-08-18 ENCOUNTER — Ambulatory Visit: Payer: BC Managed Care – PPO | Admitting: Medical

## 2015-10-12 IMAGING — CR DG CHEST 2V
2 series · 2 of 2 positions shown · non-contrast
Comparison: 02/17/2012 and earlier.

CLINICAL DATA: 53-year-old female with chest pain for several
weeks. Initial encounter.

EXAM:
CHEST  2 VIEW

[w chest pa]
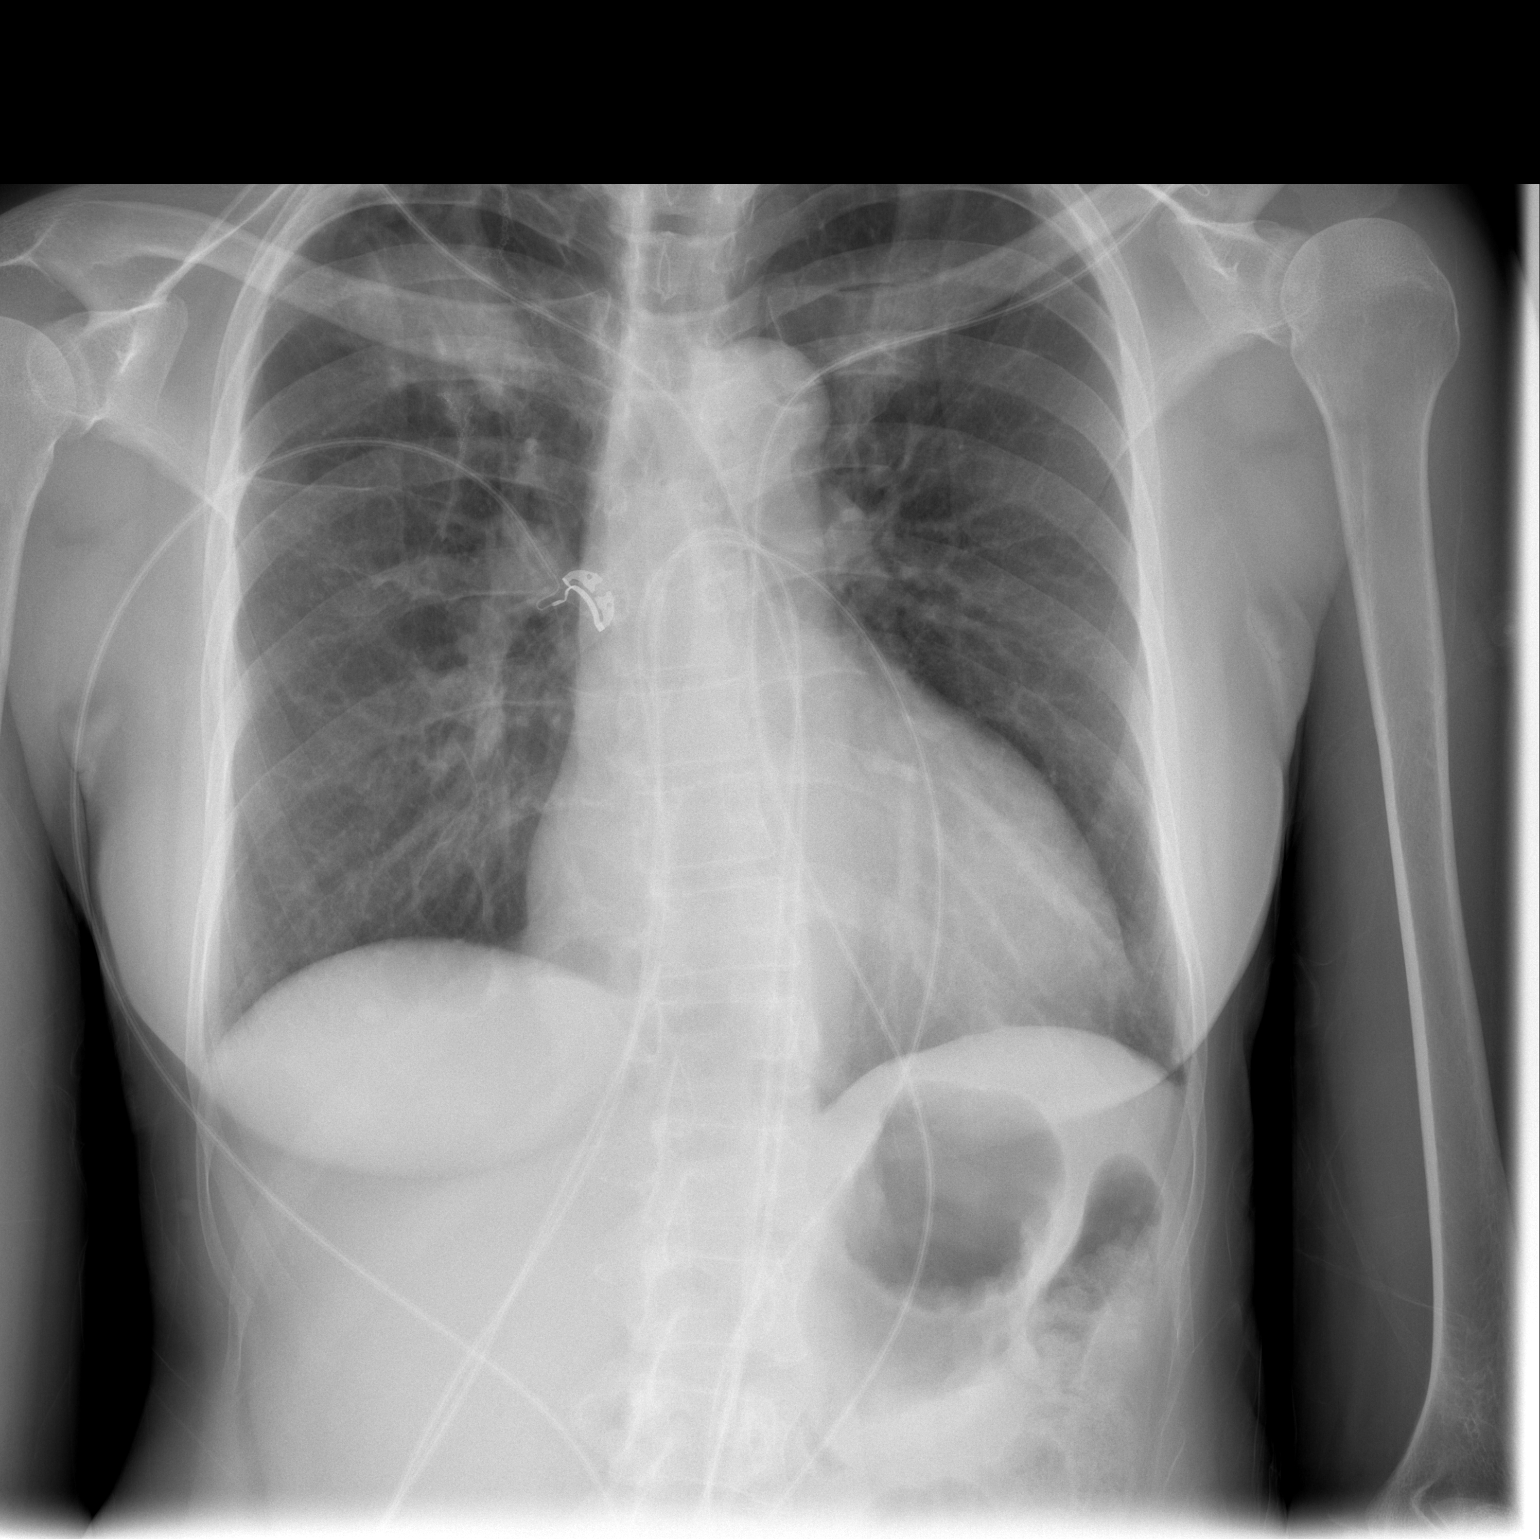

[w chest lat]
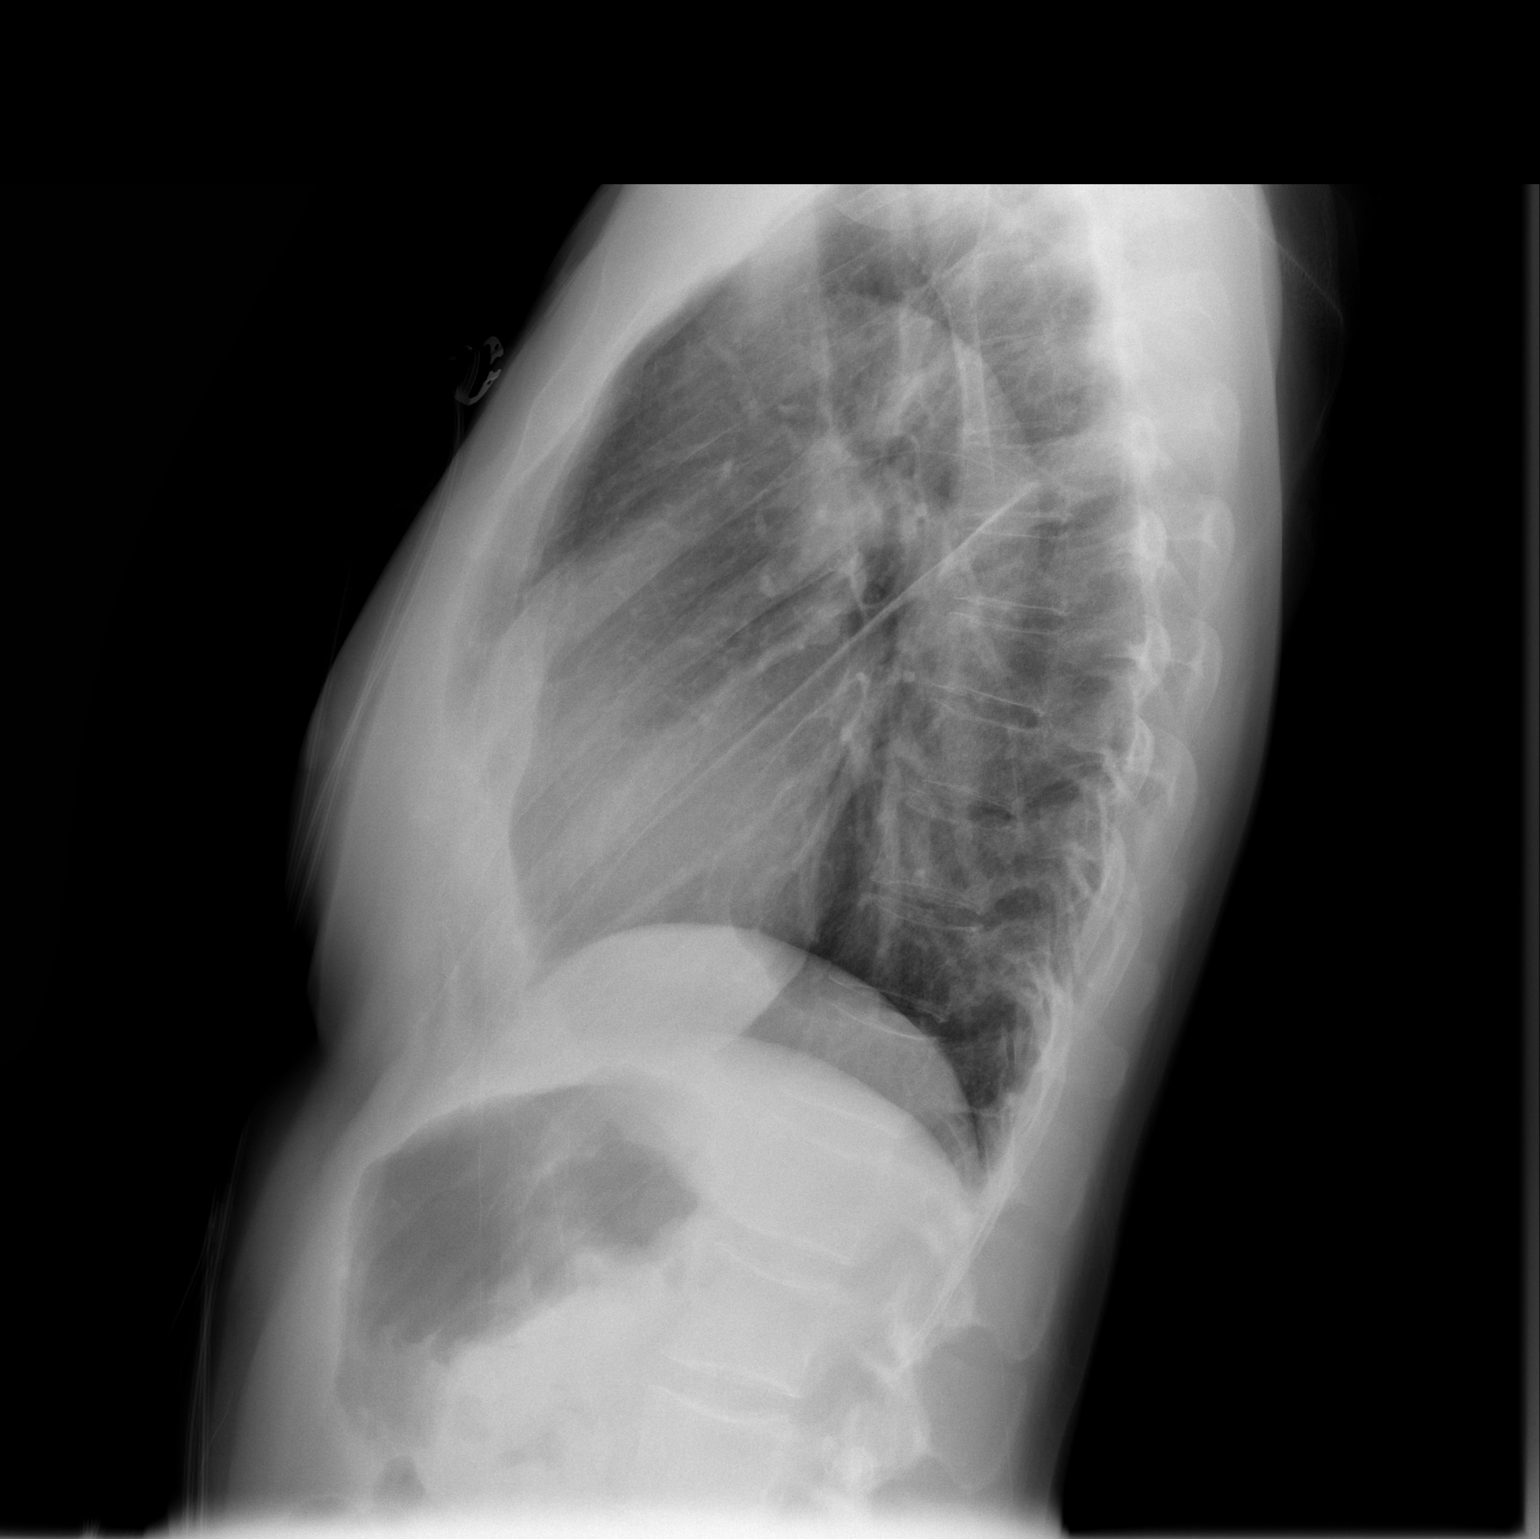

[2 of 2 positions shown; findings below may reference images not displayed]

FINDINGS: Cardiomegaly, but not significantly changed since 9419. Other
mediastinal contours are within normal limits. Chronic postoperative
changes in the right lung apex with staple lines, Some architectural
distortion and chronic patchy opacity. Hilar retraction bilaterally.
No pneumothorax or pulmonary edema. No pleural effusion or acute
pulmonary opacity. Scoliosis. No acute osseous abnormality
identified.
IMPRESSION: Chronic cardiomegaly and apical lung disease with postoperative
changes on the right. No superimposed acute findings are identified.

## 2015-10-12 IMAGING — CT CT ANGIO CHEST
2 of 6 series · 18 of 36 positions shown · IV contrast (APPLIED)
Comparison: Chest radiograph of same day.

CLINICAL DATA: Chest pain.

EXAM:
CT ANGIOGRAPHY CHEST WITH CONTRAST
TECHNIQUE: Multidetector CT imaging of the chest was performed using the
standard protocol during bolus administration of intravenous
contrast. Multiplanar CT image reconstructions and MIPs were
obtained to evaluate the vascular anatomy.
CONTRAST:  100mL OMNIPAQUE IOHEXOL 350 MG/ML SOLN

[Series 6: pe 1.0 b26f · axial · 0.57mm/px · z∈[-332,-63]mm · 17 of 299 slices shown]
[im 15/299  lung]
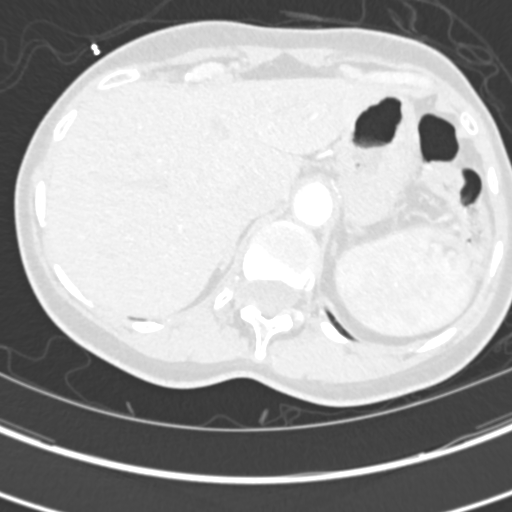
[im 30/299  mediastinal]
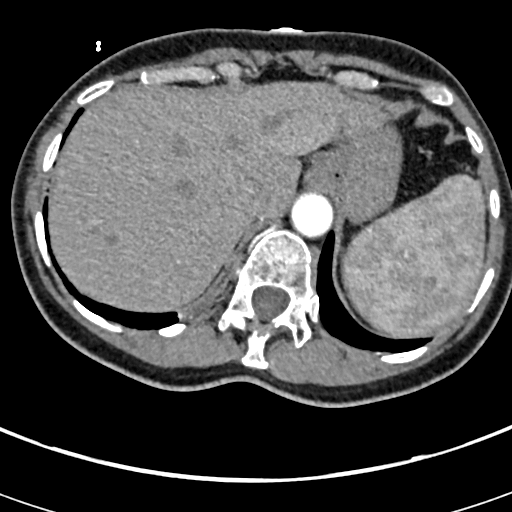
[im 45/299  lung]
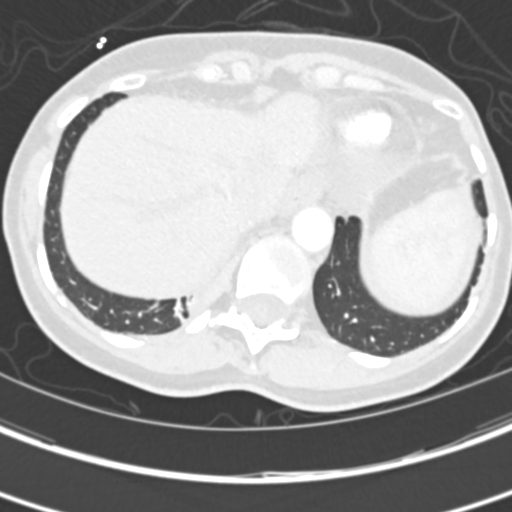
[im 60/299  mediastinal]
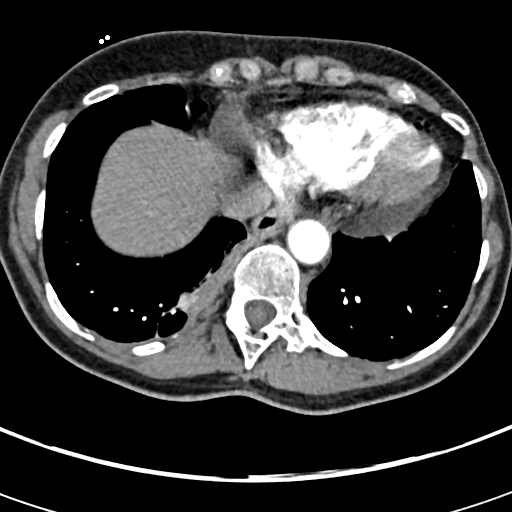
[im 90/299  lung]
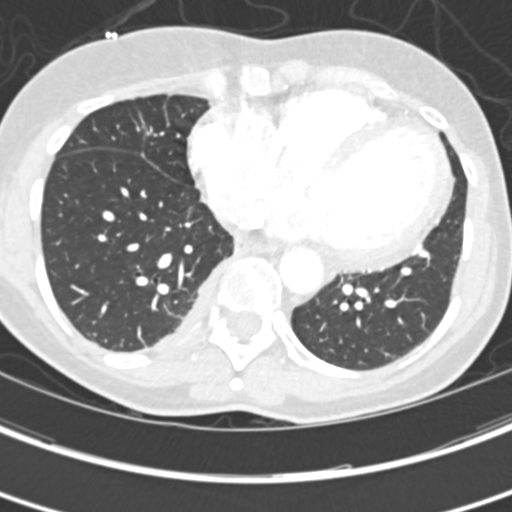
[im 105/299  mediastinal]
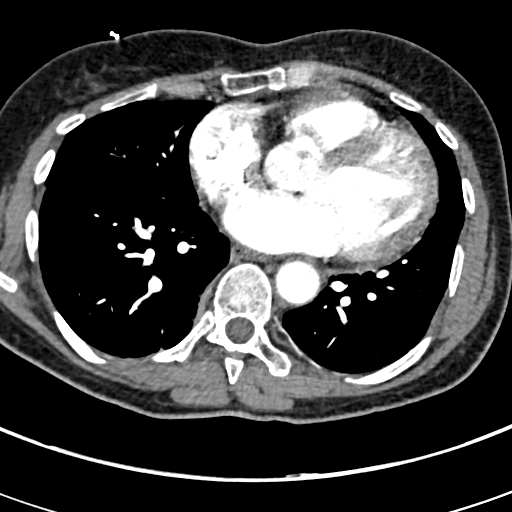
[im 120/299  lung]
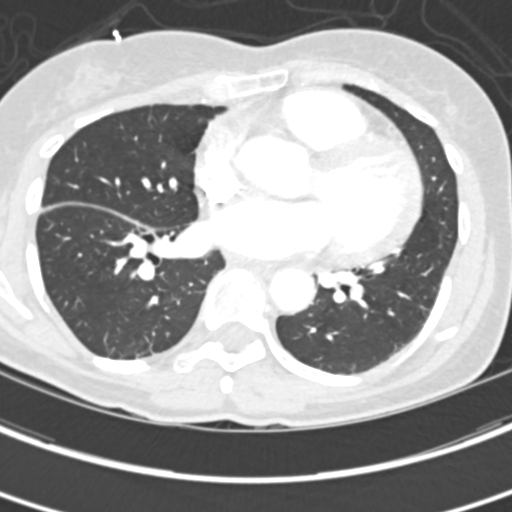
[im 135/299  mediastinal]
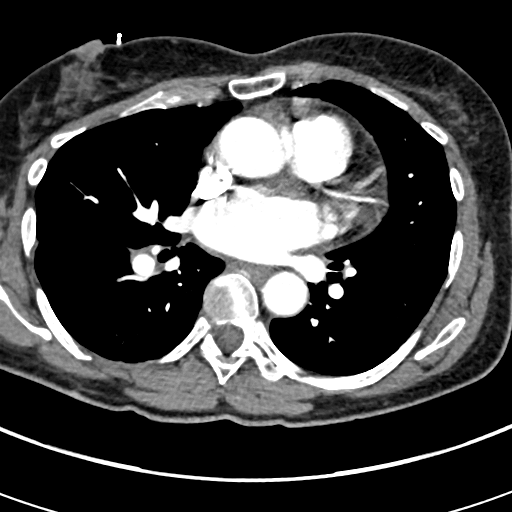
[im 150/299  lung]
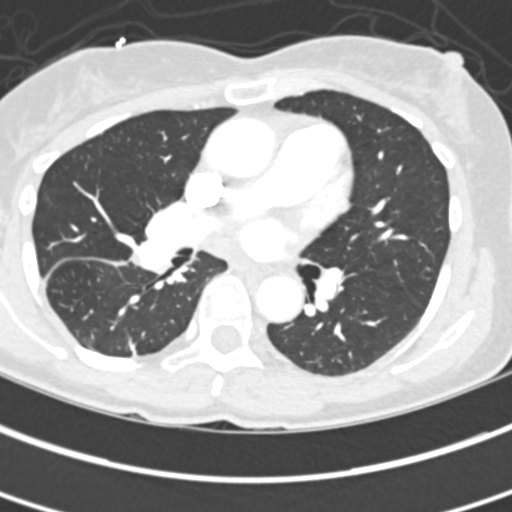
[im 164/299  mediastinal]
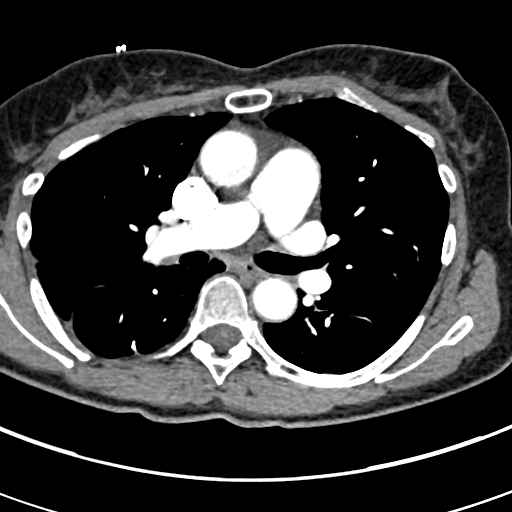
[im 179/299  lung]
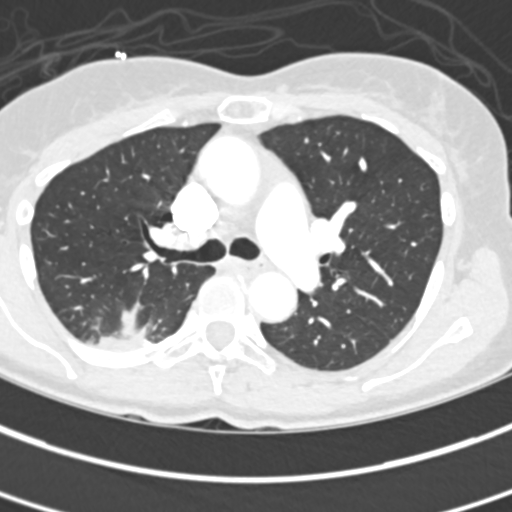
[im 194/299  mediastinal]
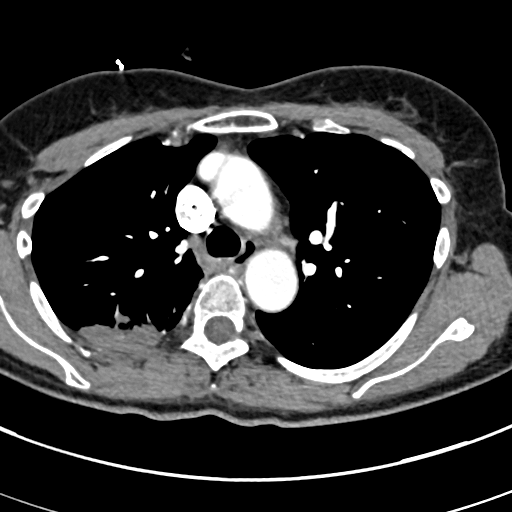
[im 209/299  lung]
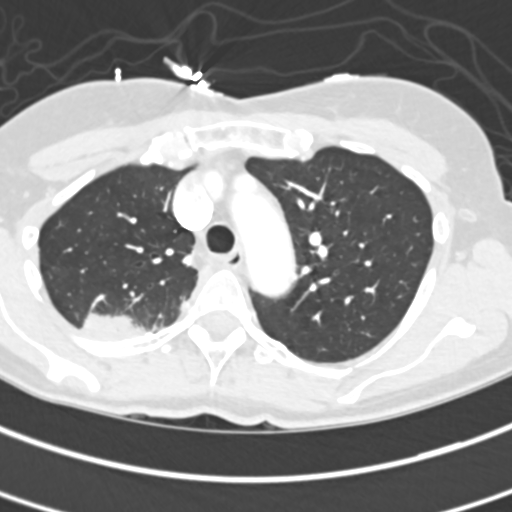
[im 239/299  mediastinal]
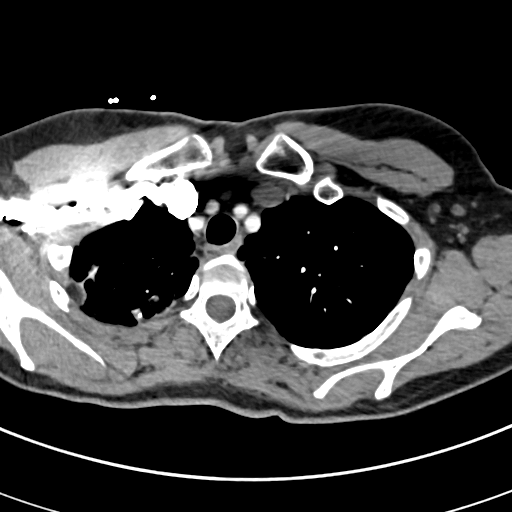
[im 254/299  lung]
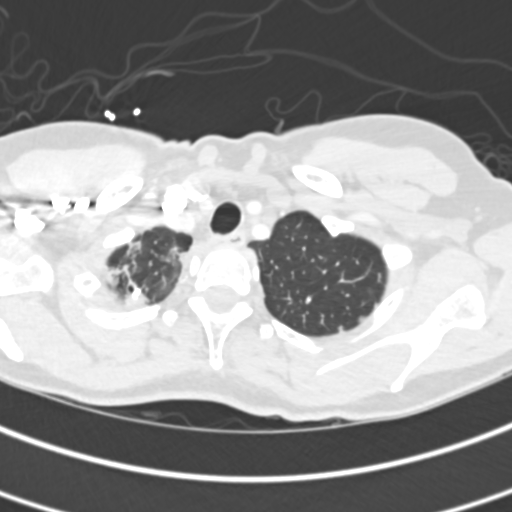
[im 269/299  mediastinal]
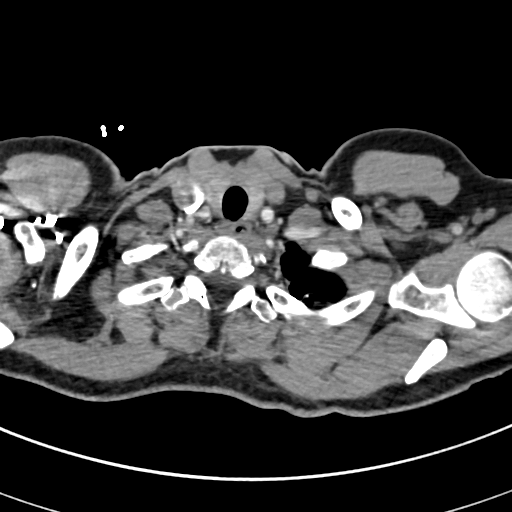
[im 284/299  lung]
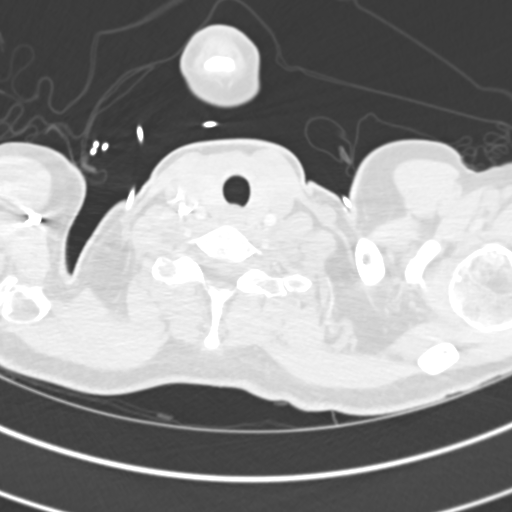

[Series 9: pe 2.0 coronal · coronal · 0.57mm/px · 1 of 109 slices shown]
[im 55/109  mediastinal]
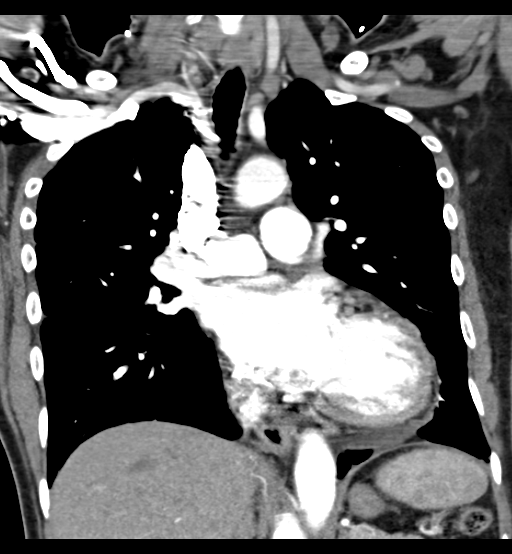

[18 of 36 positions shown; findings below may reference images not displayed]

FINDINGS: No pneumothorax is noted. Nodularity and reticular thickening is
noted in both lung apices most consistent with scarring. Airspace
opacity is noted posteriorly in the right upper lobe most consistent
with pneumonia. 6 mm nodule is noted posteriorly in the right lower
lobe best seen on image number 63 of series 7. 5 mm subpleural
nodule is noted posteriorly in the left upper lobe. Atelectasis is
noted medially and posteriorly in the right lower lobe.

There is no evidence of pulmonary embolus. There is no evidence of
thoracic aortic dissection or aneurysm. Visualized portion of upper
abdomen appears normal. Great vessels are widely patent without
significant stenosis. No mediastinal mass or adenopathy is noted. No
osseous abnormality is noted in the chest.

Review of the MIP images confirms the above findings.
IMPRESSION: There is no definite evidence of pulmonary embolus.

Pleural-based airspace opacity is noted posteriorly in the right
upper lobe most consistent with pneumonia. Followup radiographs or
followup CT scan in 2-3 weeks is recommended to ensure resolution
and rule out underlying neoplasm.

Nodular and reticular densities are noted in both lung apices most
consistent with scarring.

6 mm nodule is noted posteriorly in the right lower lobe. 5 mm
subpleural nodule is seen posteriorly in the left upper lobe. If the
patient is at high risk for bronchogenic carcinoma, follow-up chest
CT at 6-12 months is recommended. If the patient is at low risk for
bronchogenic carcinoma, follow-up chest CT at 12 months is
recommended. This recommendation follows the consensus statement:
Guidelines for Management of Small Pulmonary Nodules Detected on CT
Scans: A Statement from the [HOSPITAL] as published in

## 2015-11-16 ENCOUNTER — Telehealth: Payer: Self-pay | Admitting: Family

## 2015-11-16 NOTE — Telephone Encounter (Signed)
Pt says that she work for [employer] and had her flu vac in October.

## 2015-11-17 NOTE — Telephone Encounter (Signed)
Health maintenance updated

## 2015-12-17 ENCOUNTER — Encounter: Payer: Self-pay | Admitting: Physician Assistant

## 2015-12-17 ENCOUNTER — Ambulatory Visit (INDEPENDENT_AMBULATORY_CARE_PROVIDER_SITE_OTHER): Payer: BC Managed Care – PPO | Admitting: Physician Assistant

## 2015-12-17 VITALS — BP 130/80 | HR 83 | Temp 98.5°F | Ht 70.0 in | Wt 155.8 lb

## 2015-12-17 DIAGNOSIS — J019 Acute sinusitis, unspecified: Secondary | ICD-10-CM

## 2015-12-17 DIAGNOSIS — B9689 Other specified bacterial agents as the cause of diseases classified elsewhere: Secondary | ICD-10-CM

## 2015-12-17 MED ORDER — AMOXICILLIN-POT CLAVULANATE 875-125 MG PO TABS: 1.0000 | ORAL_TABLET | Freq: Two times a day (BID) | ORAL | Status: DC

## 2015-12-17 MED ORDER — DM-GUAIFENESIN ER 30-600 MG PO TB12: 1.0000 | ORAL_TABLET | Freq: Two times a day (BID) | ORAL | Status: DC | PRN

## 2015-12-17 MED FILL — AMOX-CLAV 875-125 MG TABLET: 875-125 | 7 days supply | Qty: 14 | Fill #0

## 2015-12-17 MED FILL — MUCINEX DM ER 600-30 MG TAB: 30-600 | 10 days supply | Qty: 20 | Fill #0

## 2015-12-17 NOTE — Progress Notes (Signed)
Pre visit review using our clinic review tool, if applicable. No additional management support is needed unless otherwise documented below in the visit note. 

## 2015-12-17 NOTE — Assessment & Plan Note (Signed)
Rx Augmentin.  Increase fluids.  Rest.  Saline nasal spray.  Probiotic.  Mucinex as directed.  Humidifier in bedroom. Flonase as directed.  Call or return to clinic if symptoms are not improving.  

## 2015-12-17 NOTE — Patient Instructions (Signed)
Please take antibiotic as directed.  Increase fluid intake.  Use Saline nasal spray.  Take a daily multivitamin.  Place a humidifier in the bedroom.  Please call or return clinic if symptoms are not improving.  Sinusitis Sinusitis is redness, soreness, and swelling (inflammation) of the paranasal sinuses. Paranasal sinuses are air pockets within the bones of your face (beneath the eyes, the middle of the forehead, or above the eyes). In healthy paranasal sinuses, mucus is able to drain out, and air is able to circulate through them by way of your nose. However, when your paranasal sinuses are inflamed, mucus and air can become trapped. This can allow bacteria and other germs to grow and cause infection. Sinusitis can develop quickly and last only a short time (acute) or continue over a long period (chronic). Sinusitis that lasts for more than 12 weeks is considered chronic.  CAUSES  Causes of sinusitis include:  Allergies.  Structural abnormalities, such as displacement of the cartilage that separates your nostrils (deviated septum), which can decrease the air flow through your nose and sinuses and affect sinus drainage.  Functional abnormalities, such as when the small hairs (cilia) that line your sinuses and help remove mucus do not work properly or are not present. SYMPTOMS  Symptoms of acute and chronic sinusitis are the same. The primary symptoms are pain and pressure around the affected sinuses. Other symptoms include:  Upper toothache.  Earache.  Headache.  Bad breath.  Decreased sense of smell and taste.  A cough, which worsens when you are lying flat.  Fatigue.  Fever.  Thick drainage from your nose, which often is green and may contain pus (purulent).  Swelling and warmth over the affected sinuses. DIAGNOSIS  Your caregiver will perform a physical exam. During the exam, your caregiver may:  Look in your nose for signs of abnormal growths in your nostrils (nasal  polyps).  Tap over the affected sinus to check for signs of infection.  View the inside of your sinuses (endoscopy) with a special imaging device with a light attached (endoscope), which is inserted into your sinuses. If your caregiver suspects that you have chronic sinusitis, one or more of the following tests may be recommended:  Allergy tests.  Nasal culture A sample of mucus is taken from your nose and sent to a lab and screened for bacteria.  Nasal cytology A sample of mucus is taken from your nose and examined by your caregiver to determine if your sinusitis is related to an allergy. TREATMENT  Most cases of acute sinusitis are related to a viral infection and will resolve on their own within 10 days. Sometimes medicines are prescribed to help relieve symptoms (pain medicine, decongestants, nasal steroid sprays, or saline sprays).  However, for sinusitis related to a bacterial infection, your caregiver will prescribe antibiotic medicines. These are medicines that will help kill the bacteria causing the infection.  Rarely, sinusitis is caused by a fungal infection. In theses cases, your caregiver will prescribe antifungal medicine. For some cases of chronic sinusitis, surgery is needed. Generally, these are cases in which sinusitis recurs more than 3 times per year, despite other treatments. HOME CARE INSTRUCTIONS   Drink plenty of water. Water helps thin the mucus so your sinuses can drain more easily.  Use a humidifier.  Inhale steam 3 to 4 times a day (for example, sit in the bathroom with the shower running).  Apply a warm, moist washcloth to your face 3 to 4 times a day,   or as directed by your caregiver.  Use saline nasal sprays to help moisten and clean your sinuses.  Take over-the-counter or prescription medicines for pain, discomfort, or fever only as directed by your caregiver. SEEK IMMEDIATE MEDICAL CARE IF:  You have increasing pain or severe headaches.  You have  nausea, vomiting, or drowsiness.  You have swelling around your face.  You have vision problems.  You have a stiff neck.  You have difficulty breathing. MAKE SURE YOU:   Understand these instructions.  Will watch your condition.  Will get help right away if you are not doing well or get worse. Document Released: 11/20/2005 Document Revised: 02/12/2012 Document Reviewed: 12/05/2011 [employer] Patient Information 2014 [employer].   

## 2015-12-17 NOTE — Progress Notes (Signed)
Patient presents to clinic today c/o 2-3 weeks of sinus pressure, ear pressure and cough, worsening over the past 4-5 days with sinus pain and facial pain. Endorses low-grade fever. Denies chest congestion but notes a rare dry cough. Denies recent travel. Has taken Mucinex once with little relief in symptoms.  Past Medical History  Diagnosis Date  . Hypertension   . Depression   . Migraine   . Irregular heart rhythm     unclear what arrhythmia  . Pneumothorax     pneumonia  . Torus palatinus   . Supraventricular tachycardia, paroxysmal (HCC)     a. visit to EF 03/2011;  b. Event monitor (@[facility] ) 6/12:  PVCs/PACs, 3 episodes of WCT (SVT with aberrancy)  . Anxiety   . GERD (gastroesophageal reflux disease)   . Anemia   . H. pylori infection   . Hx of cardiovascular stress test     Myoview 6/12:  diaph atten, no ischemia, low risk  . Hx of echocardiogram     Echo 6/12:  EF 50-55%, mild diast dysfn, mild LAE, mild MR    Current Outpatient Prescriptions on File Prior to Visit  Medication Sig Dispense Refill  . Multiple Vitamin (MULTIVITAMIN) tablet Take 1 tablet by mouth daily.    . [DISCONTINUED] omeprazole (PRILOSEC) 20 MG capsule Take 1 capsule (20 mg total) by mouth 2 (two) times daily before a meal. (Patient not taking: Reported on 01/19/2015) 60 capsule 1   No current facility-administered medications on file prior to visit.    No Known Allergies  Family History  Problem Relation Age of Onset  . Depression Mother   . Diabetes Mother   . Heart disease Mother   . Hypertension Mother   . Arthritis Mother   . Mental illness Mother   . Hypertension Father   . Coronary artery disease Father   . Heart disease Father   . Heart failure Brother   . Heart disease Brother   . Hypertension Brother     x2  . Hypertension Sister   . Stomach cancer Neg Hx     Social History   Social History  . Marital Status: Legally Separated    Spouse Name: N/A  . Number of Children: 2    . 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 Smoking status: Never Smoker   . Smokeless tobacco: Never Used  . Alcohol Use: No  . Drug Use: No  . Sexual Activity: Yes   Other Topics Concern  . None   Social History Narrative   Review of Systems - See HPI.  All other ROS are negative.  BP 128/109 mmHg  Pulse 79  Temp(Src) 98.5 F (36.9 C) (Oral)  Ht 5\' 10"  (1.778 m)  Wt 155 lb 12.8 oz (70.67 kg)  BMI 22.35 kg/m2  SpO2 100%  Physical Exam  Constitutional: She is oriented to person, place, and time and well-developed, well-nourished, and in no distress.  HENT:  Head: Normocephalic and atraumatic.  Right Ear: External ear normal.  Left Ear: External ear normal.  Nose: Right sinus exhibits frontal sinus tenderness. Left sinus exhibits frontal sinus tenderness.  Mouth/Throat: Oropharynx is clear and moist.  Eyes: Conjunctivae are normal.  Neck: Neck supple.  Cardiovascular: Normal rate, regular rhythm, normal heart sounds and intact distal pulses.   Pulmonary/Chest: Effort normal and breath sounds normal. No respiratory distress. She has no wheezes. She  has no rales. She exhibits no tenderness.  Lymphadenopathy:    She has no cervical adenopathy.  Neurological: She is alert and oriented to person, place, and time.  Skin: Skin is warm and dry. No rash noted.  Psychiatric: Affect normal.  Vitals reviewed.   No results found for this or any previous visit (from the past 2160 hour(s)).  Assessment/Plan: Acute bacterial sinusitis Rx Augmentin.  Increase fluids.  Rest.  Saline nasal spray.  Probiotic.  Mucinex as directed.  Humidifier in bedroom. Flonase as directed.  Call or return to clinic if symptoms are not improving.

## 2016-03-03 ENCOUNTER — Ambulatory Visit (INDEPENDENT_AMBULATORY_CARE_PROVIDER_SITE_OTHER): Payer: BC Managed Care – PPO | Admitting: Family Medicine

## 2016-03-03 ENCOUNTER — Encounter: Payer: Self-pay | Admitting: Family Medicine

## 2016-03-03 VITALS — BP 148/87 | HR 82 | Temp 99.7°F | Wt 149.2 lb

## 2016-03-03 DIAGNOSIS — R509 Fever, unspecified: Secondary | ICD-10-CM

## 2016-03-03 DIAGNOSIS — J1189 Influenza due to unidentified influenza virus with other manifestations: Secondary | ICD-10-CM | POA: Diagnosis not present

## 2016-03-03 DIAGNOSIS — D649 Anemia, unspecified: Secondary | ICD-10-CM | POA: Diagnosis not present

## 2016-03-03 DIAGNOSIS — J111 Influenza due to unidentified influenza virus with other respiratory manifestations: Secondary | ICD-10-CM

## 2016-03-03 LAB — CBC WITH DIFFERENTIAL/PLATELET
BASOS ABS: 0 10*3/uL (ref 0.0–0.1)
Basophils Relative: 0 % (ref 0–1)
Eosinophils Absolute: 0.2 10*3/uL (ref 0.0–0.7)
Eosinophils Relative: 3 % (ref 0–5)
HCT: 37 % (ref 36.0–46.0)
Hemoglobin: 12.5 g/dL (ref 12.0–15.0)
LYMPHS PCT: 35 % (ref 12–46)
Lymphs Abs: 2 10*3/uL (ref 0.7–4.0)
MCH: 25 pg — AB (ref 26.0–34.0)
MCHC: 33.8 g/dL (ref 30.0–36.0)
MCV: 74 fL — ABNORMAL LOW (ref 78.0–100.0)
MPV: 9.6 fL (ref 8.6–12.4)
Monocytes Absolute: 0.4 10*3/uL (ref 0.1–1.0)
Monocytes Relative: 7 % (ref 3–12)
NEUTROS PCT: 55 % (ref 43–77)
Neutro Abs: 3.1 10*3/uL (ref 1.7–7.7)
PLATELETS: 279 10*3/uL (ref 150–400)
RBC: 5 MIL/uL (ref 3.87–5.11)
RDW: 16.6 % — AB (ref 11.5–15.5)
WBC: 5.6 10*3/uL (ref 4.0–10.5)

## 2016-03-03 MED ORDER — OSELTAMIVIR PHOSPHATE 75 MG PO CAPS: 75.0000 mg | ORAL_CAPSULE | Freq: Two times a day (BID) | ORAL | Status: DC

## 2016-03-03 MED FILL — OSELTAMIVIR PHOS 75 MG CAP: 75 | 5 days supply | Qty: 10 | Fill #0

## 2016-03-03 NOTE — Progress Notes (Signed)
Patient ID: Renee CoderCynthia Garland, female    DOB: 1961/11/20  Age: 55 y.o. MRN: [id number]    Subjective:  Subjective HPI [name] presents for body aches , fever and loose stools x few days --- her son has been sick with the flu all week and she has not been feeling well since shortly after he got sick.    Review of Systems  Constitutional: Positive for fever and fatigue. Negative for diaphoresis, appetite change and unexpected weight change.  HENT: Positive for postnasal drip.   Eyes: Negative for pain, redness and visual disturbance.  Respiratory: Positive for cough. Negative for chest tightness, shortness of breath and wheezing.   Cardiovascular: Negative for chest pain, palpitations and leg swelling.  Endocrine: Negative for cold intolerance, heat intolerance, polydipsia, polyphagia and polyuria.  Genitourinary: Negative for dysuria, frequency and difficulty urinating.  Musculoskeletal: Positive for arthralgias.  Neurological: Negative for dizziness, light-headedness, numbness and headaches.    History Past Medical History  Diagnosis Date  . Hypertension   . Depression   . Migraine   . Irregular heart rhythm     unclear what arrhythmia  . Pneumothorax     pneumonia  . Torus palatinus   . Supraventricular tachycardia, paroxysmal (HCC)     a. visit to EF 03/2011;  b. Event monitor (@[facility] ) 6/12:  PVCs/PACs, 3 episodes of WCT (SVT with aberrancy)  . Anxiety   . GERD (gastroesophageal reflux disease)   . Anemia   . H. pylori infection   . Hx of cardiovascular stress test     Myoview 6/12:  diaph atten, no ischemia, low risk  . Hx of echocardiogram     Echo 6/12:  EF 50-55%, mild diast dysfn, mild LAE, mild MR    She has past surgical history that includes Cesarean section; Abdominal hysterectomy (2005); and Pleural scarification.   Her family history includes Arthritis in her mother; Coronary artery disease in her father; Depression in her mother; Diabetes in her mother;  Heart disease in her brother, father, and mother; Heart failure in her brother; Hypertension in her brother, father, mother, and sister; Mental illness in her mother. There is no history of Stomach cancer.She reports that she has never smoked. She has never used smokeless tobacco. She reports that she does not drink alcohol or use illicit drugs.  Current Outpatient Prescriptions on File Prior to Visit  Medication Sig Dispense Refill  . Multiple Vitamin (MULTIVITAMIN) tablet Take 1 tablet by mouth daily.    . [DISCONTINUED] omeprazole (PRILOSEC) 20 MG capsule Take 1 capsule (20 mg total) by mouth 2 (two) times daily before a meal. (Patient not taking: Reported on 01/19/2015) 60 capsule 1   No current facility-administered medications on file prior to visit.     Objective:  Objective Physical Exam  Constitutional: She is oriented to person, place, and time. She appears well-developed and well-nourished.  HENT:  Head: Normocephalic and atraumatic.  Nose: No mucosal edema or rhinorrhea.  Mouth/Throat: Oropharyngeal exudate present. No posterior oropharyngeal erythema.  Eyes: Conjunctivae and EOM are normal.  Neck: Normal range of motion. Neck supple. No JVD present. Carotid bruit is not present. No thyromegaly present.  Cardiovascular: Normal rate, regular rhythm and normal heart sounds.   No murmur heard. Pulmonary/Chest: Effort normal and breath sounds normal. No respiratory distress. She has no wheezes. She has no rales. She exhibits no tenderness.  Abdominal: Soft. Bowel sounds are normal. She exhibits no distension. There is no tenderness. There is no rebound.  Musculoskeletal:  She exhibits no edema.  Neurological: She is alert and oriented to person, place, and time.  Psychiatric: She has a normal mood and affect.  Nursing note and vitals reviewed.  BP 148/87 mmHg  Pulse 82  Temp(Src) 99.7 F (37.6 C) (Oral)  Wt 149 lb 3.2 oz (67.677 kg)  SpO2 98% Wt Readings from Last 3  Encounters:  03/03/16 149 lb 3.2 oz (67.677 kg)  12/17/15 155 lb 12.8 oz (70.67 kg)  08/17/15 149 lb 6 oz (67.756 kg)      Lab Results  Component Value Date   WBC 5.6 03/03/2016   HGB 12.5 03/03/2016   HCT 37.0 03/03/2016   PLT 279 03/03/2016   GLUCOSE 120* 01/19/2015   CHOL 191 01/19/2010   TRIG 46 01/19/2010   HDL 58 01/19/2010   LDLCALC 124* 01/19/2010   ALT 31 01/19/2015   AST 25 01/19/2015   NA 138 01/19/2015   K 3.5 01/19/2015   CL 107 01/19/2015   CREATININE 0.64 01/19/2015   BUN 14 01/19/2015   CO2 26 01/19/2015   TSH 0.661 02/17/2012   INR 0.98 03/24/2011    Ct Angio Chest Pe W/cm &/or Wo Cm  01/19/2015  CLINICAL DATA:  Short of breath EXAM: CT ANGIOGRAPHY CHEST WITH CONTRAST TECHNIQUE: Multidetector CT imaging of the chest was performed using the standard protocol during bolus administration of intravenous contrast. Multiplanar CT image reconstructions and MIPs were obtained to evaluate the vascular anatomy. CONTRAST:  OMNIPAQUE IOHEXOL 350 MG/ML SOLN COMPARISON:  CT chest 11/10/2014 FINDINGS: Peripheral patchy airspace density in the posterior segment of the right upper lobe abuts the pleural surface and appear slightly smaller. The margins are irregular and there are small irregular nodular densities extending into the adjacent lung parenchyma as noted previously. Surgical clips are present in the area. (Pathology results of right upper lobe biopsy from 03/21/2000 reveal benign lung with patchy subpleural an alveolar hemorrhage. No pneumonitis, granuloma or malignancy). Mild plural and subpleural apical densities bilaterally are stable and most consistent with scarring. 1 cm nodule right lower lobe axial image number 58 is stable. Minimal pleural effusion on the left has accumulated with mild left lower lobe atelectasis. Right medial lung base pleural thickening is unchanged from the prior CT. Negative for pulmonary embolism. Negative for aortic aneurysm or  dissection. Cardiac enlargement without pericardial effusion. Review of the MIP images confirms the above findings. IMPRESSION: Negative for pulmonary embolism. Patchy airspace disease in the right posterior apex is slightly improved from the prior CT. This may represent an area of persistent infection such as atypical pneumonia. Tuberculosis not excluded. Correlate with symptoms. Follow-up CT recommended in 3 months to evaluate for interval change. There has been a prior biopsy of the right upper lobe in 2001 revealing benign findings. 1 cm right lower lobe nodule unchanged. Right medial lung base pleural thickening is unchanged and likely a scarring. There is scarring in both lung apices. Electronically Signed   By: Marlan Palau M.D.   On: 01/19/2015 17:12     Assessment & Plan:  Plan I have discontinued [name]'s amoxicillin-clavulanate and dextromethorphan-guaiFENesin. I am also having her start on oseltamivir. Additionally, I am having her maintain her multivitamin.  Meds ordered this encounter  Medications  . oseltamivir (TAMIFLU) 75 MG capsule    Sig: Take 1 capsule (75 mg total) by mouth 2 (two) times daily.    Dispense:  10 capsule    Refill:  0    Problem List Items  Addressed This Visit    None    Visit Diagnoses    Fever, unspecified    -  Primary    Relevant Orders    POCT Influenza A/B    CBC w/Diff (Completed)    IBC panel (Completed)    Vitamin B12 (Completed)    Influenza with respiratory manifestation other than pneumonia        Relevant Medications    oseltamivir (TAMIFLU) 75 MG capsule    Other Relevant Orders    CBC w/Diff (Completed)    IBC panel (Completed)    Vitamin B12 (Completed)    Anemia, unspecified anemia type        Relevant Orders    CBC w/Diff (Completed)    IBC panel (Completed)    Vitamin B12 (Completed)       Follow-up: Return if symptoms worsen or fail to improve.  Donato Schultz, DO

## 2016-03-03 NOTE — Patient Instructions (Signed)
Influenza Tests WHY AM I HAVING THIS TEST? You may have an influenza test to help your health care provider determine what type of respiratory infection you have. The test may also be used to help determine a treatment plan and to monitor influenza activity within a community. There are two types of influenza virus: types A and B. Often, one strain of type A influenza will be the most common type of influenza in a community during flu season. This is typically between the months of October and May. Influenza tests can help determine which strain of influenza type A is occurring most often in the community. WHAT KIND OF SAMPLE IS TAKEN? Influenza tests are performed by collecting a small sample of fluids (secretions) from your nose or throat using a cotton swab. Tests performed on nasal secretions are more accurate than tests performed on a sample taken from your throat.  Rapid influenza tests are available and have become the most frequently used tests for influenza. They are most accurate when completed within the first 48 hours after your symptoms begin.  Depending on the method, a rapid influenza test may be completed in your health care provider's office in less than 30 minutes. It can also be sent to a lab with the results available the same day.  Depending on the particular type of test used, it can identify influenza type A, a mixture of types A and B, or differentiate between type A and B.  Another test that your health care provider may order is a viral culture. This also requires the collection of secretions from your nose or throat. The sample is then sent to a lab for processing. This may take several days to complete. HOW ARE YOUR TEST RESULTS REPORTED? Your test results will be reported as either positive or negative. A false-negative result can occur. A false-negative result is incorrect because it indicates a condition or finding is not present when it is. It is your responsibility to  obtain your test results. Ask the lab or department performing the test when and how you will get your results. WHAT DO THE RESULTS MEAN?  A positive test means you have influenza. Tests may further determine the type of influenza you have.  A negative influenza test result means it is not likely that you have influenza.  A false-negative result can occur. False-negative results are more likely to happen at the height of the influenza season. Talk with your health care provider to discuss your results, treatment options, and if necessary, the need for more tests. Talk with your health care provider if you have any questions about your results.   This information is not intended to replace advice given to you by your health care provider. Make sure you discuss any questions you have with your health care provider.   Document Released: 08/30/2005 Document Revised: 12/11/2014 Document Reviewed: 04/08/2014 Elsevier Interactive Patient Education 2016 [employer]  

## 2016-03-03 NOTE — Progress Notes (Signed)
Pre visit review using our clinic review tool, if applicable. No additional management support is needed unless otherwise documented below in the visit note. 

## 2016-03-04 LAB — VITAMIN B12: Vitamin B-12: 888 pg/mL (ref 200–1100)

## 2016-03-08 LAB — IBC PANEL
%SAT: 12 % (ref 11–50)
TIBC: 283 ug/dL (ref 250–450)
UIBC: 249 ug/dL (ref 125–400)

## 2016-03-08 LAB — IRON: IRON: 34 ug/dL — AB (ref 45–160)

## 2016-03-13 ENCOUNTER — Other Ambulatory Visit: Payer: Self-pay | Admitting: Family

## 2016-03-13 DIAGNOSIS — Z1231 Encounter for screening mammogram for malignant neoplasm of breast: Secondary | ICD-10-CM

## 2016-03-14 ENCOUNTER — Ambulatory Visit (HOSPITAL_BASED_OUTPATIENT_CLINIC_OR_DEPARTMENT_OTHER)
Admission: RE | Admit: 2016-03-14 | Discharge: 2016-03-14 | Disposition: A | Discharge status: Home / Self Care | Payer: BC Managed Care – PPO | Source: Ambulatory Visit | Attending: Family | Admitting: Family

## 2016-03-14 DIAGNOSIS — Z1231 Encounter for screening mammogram for malignant neoplasm of breast: Secondary | ICD-10-CM | POA: Diagnosis not present

## 2016-03-14 DIAGNOSIS — R928 Other abnormal and inconclusive findings on diagnostic imaging of breast: Secondary | ICD-10-CM | POA: Diagnosis not present

## 2016-03-16 ENCOUNTER — Other Ambulatory Visit: Payer: Self-pay | Admitting: Family

## 2016-03-16 DIAGNOSIS — R928 Other abnormal and inconclusive findings on diagnostic imaging of breast: Secondary | ICD-10-CM

## 2016-03-21 ENCOUNTER — Other Ambulatory Visit: Payer: Self-pay | Admitting: Family

## 2016-03-21 ENCOUNTER — Other Ambulatory Visit: Payer: Self-pay

## 2016-03-21 DIAGNOSIS — R928 Other abnormal and inconclusive findings on diagnostic imaging of breast: Secondary | ICD-10-CM

## 2016-03-22 ENCOUNTER — Ambulatory Visit
Admission: RE | Admit: 2016-03-22 | Discharge: 2016-03-22 | Disposition: A | Discharge status: Home / Self Care | Payer: BC Managed Care – PPO | Source: Ambulatory Visit | Attending: Family | Admitting: Family

## 2016-03-22 ENCOUNTER — Telehealth: Payer: Self-pay | Admitting: Medical

## 2016-03-22 DIAGNOSIS — R928 Other abnormal and inconclusive findings on diagnostic imaging of breast: Secondary | ICD-10-CM

## 2016-03-22 NOTE — Telephone Encounter (Signed)
I canceled CT of chest order today. On 04-23-2015 pt told Clydie BraunKaren that she was not going to get study done. I believe this order keeps showing up in computer showing up as overdue box. Pt on chart review indicated no intention of getting study done so I will cancel.

## 2016-07-17 ENCOUNTER — Emergency Department (HOSPITAL_COMMUNITY)
Admission: EM | Admit: 2016-07-17 | Discharge: 2016-07-17 | Disposition: A | Discharge status: Home / Self Care | Payer: BC Managed Care – PPO | Attending: Emergency Medicine | Admitting: Emergency Medicine

## 2016-07-17 ENCOUNTER — Encounter (HOSPITAL_COMMUNITY): Payer: Self-pay | Admitting: *Deleted

## 2016-07-17 DIAGNOSIS — R404 Transient alteration of awareness: Secondary | ICD-10-CM | POA: Diagnosis not present

## 2016-07-17 DIAGNOSIS — R531 Weakness: Secondary | ICD-10-CM | POA: Insufficient documentation

## 2016-07-17 DIAGNOSIS — I10 Essential (primary) hypertension: Secondary | ICD-10-CM | POA: Insufficient documentation

## 2016-07-17 LAB — URINE MICROSCOPIC-ADD ON
Bacteria, UA: NONE SEEN
Squamous Epithelial / LPF: NONE SEEN
WBC, UA: NONE SEEN WBC/hpf (ref 0–5)

## 2016-07-17 LAB — COMPREHENSIVE METABOLIC PANEL
ALK PHOS: 73 U/L (ref 38–126)
ALT: 17 U/L (ref 14–54)
AST: 21 U/L (ref 15–41)
Albumin: 4.5 g/dL (ref 3.5–5.0)
Anion gap: 9 (ref 5–15)
BILIRUBIN TOTAL: 0.6 mg/dL (ref 0.3–1.2)
BUN: 12 mg/dL (ref 6–20)
CALCIUM: 9.5 mg/dL (ref 8.9–10.3)
CHLORIDE: 107 mmol/L (ref 101–111)
CO2: 25 mmol/L (ref 22–32)
CREATININE: 0.78 mg/dL (ref 0.44–1.00)
Glucose, Bld: 97 mg/dL (ref 65–99)
Potassium: 3.4 mmol/L — ABNORMAL LOW (ref 3.5–5.1)
Sodium: 141 mmol/L (ref 135–145)
TOTAL PROTEIN: 7.4 g/dL (ref 6.5–8.1)

## 2016-07-17 LAB — CBC WITH DIFFERENTIAL/PLATELET
Basophils Absolute: 0 10*3/uL (ref 0.0–0.1)
Basophils Relative: 0 %
EOS PCT: 2 %
Eosinophils Absolute: 0.1 10*3/uL (ref 0.0–0.7)
HEMATOCRIT: 40.2 % (ref 36.0–46.0)
Hemoglobin: 13.5 g/dL (ref 12.0–15.0)
LYMPHS ABS: 2.1 10*3/uL (ref 0.7–4.0)
LYMPHS PCT: 40 %
MCH: 25.3 pg — AB (ref 26.0–34.0)
MCHC: 33.6 g/dL (ref 30.0–36.0)
MCV: 75.4 fL — AB (ref 78.0–100.0)
Monocytes Absolute: 0.3 10*3/uL (ref 0.1–1.0)
Monocytes Relative: 6 %
NEUTROS ABS: 2.6 10*3/uL (ref 1.7–7.7)
Neutrophils Relative %: 52 %
PLATELETS: 239 10*3/uL (ref 150–400)
RBC: 5.33 MIL/uL — AB (ref 3.87–5.11)
RDW: 15.4 % (ref 11.5–15.5)
WBC: 5.2 10*3/uL (ref 4.0–10.5)

## 2016-07-17 LAB — URINALYSIS, ROUTINE W REFLEX MICROSCOPIC
BILIRUBIN URINE: NEGATIVE
GLUCOSE, UA: NEGATIVE mg/dL
KETONES UR: NEGATIVE mg/dL
Leukocytes, UA: NEGATIVE
Nitrite: NEGATIVE
PH: 8 (ref 5.0–8.0)
PROTEIN: NEGATIVE mg/dL
Specific Gravity, Urine: 1.01 (ref 1.005–1.030)

## 2016-07-17 LAB — I-STAT TROPONIN, ED: TROPONIN I, POC: 0 ng/mL (ref 0.00–0.08)

## 2016-07-17 LAB — CK: Total CK: 173 U/L (ref 38–234)

## 2016-07-17 MED ORDER — HYDROCHLOROTHIAZIDE 12.5 MG PO CAPS
12.5000 mg | ORAL_CAPSULE | Freq: Once | ORAL | Status: AC
  Administered 2016-07-17: 12.5 mg via ORAL
  Filled 2016-07-17: qty 1

## 2016-07-17 MED ORDER — HYDROCHLOROTHIAZIDE 12.5 MG PO TABS: 12.5000 mg | ORAL_TABLET | Freq: Every day | ORAL | 0 refills | Status: DC

## 2016-07-17 MED ORDER — SODIUM CHLORIDE 0.9 % IV BOLUS (SEPSIS)
500.0000 mL | Freq: Once | INTRAVENOUS | Status: AC
  Administered 2016-07-17: 500 mL via INTRAVENOUS

## 2016-07-17 NOTE — ED Notes (Signed)
Pt. Ambulated to restroom with one assist. Pt. sts she feels better, but still very weak.

## 2016-07-17 NOTE — ED Triage Notes (Signed)
[facility] Pt states she has been having increasing weakness x [redacted] week along with fatigue. Pt states she hasn't been eating well rt loss of appetite, but endorses drinking plenty of water. Upon EMS arrival pt was htn at 200/100 with frequent PVCs, which were resolved with oxygen. Pt denies pain, unilateral sx, facial droop, or speech difficulties.

## 2016-07-17 NOTE — ED Provider Notes (Signed)
[facility]ENCY DEPT Provider Note   CSN: [id number] Arrival date & time: 07/17/16  1729     History   Chief Complaint Chief Complaint  Patient presents with  . Weakness    HPI [name] is a 55 y.o. female.  Patient is a 55 year old female with a history of anemia, GERD, dysrhythmia, pneumothorax with a normal Myoview in 2012 the mildly abnormal echo with mild MR presenting today for 1 week of generalized fatigue. Patient states she has just not felt well and been very tired over the last 1 week. She does note she has been under an extreme amount of stress for some time now but has been doing a lot this last week and has not been eating like she should but has been drinking plenty of fluids. Today while she was at work after she had been there for approximately 8 hours she felt like she was unable to hold up her own body. She was having a throbbing discomfort in her head but denied vomiting, abdominal pain, chest pain, shortness of breath, palpitations or diarrhea. She currently takes no medications and saw her doctor several months ago and had normal labs. When EMS arrived today her blood pressure was extremely high at 200/120. She states her blood pressure is never been that elevated in the past. She was on blood pressure medication for a while but is currently not taking anything. Here patient is starting to feel better and denies any unilateral symptoms or difficulty with speech.   The history is provided by the patient.  Weakness  This is a new problem. Episode onset: 1 week. The problem occurs constantly. The problem has been gradually improving. Associated symptoms include headaches. Pertinent negatives include no chest pain, no abdominal pain and no shortness of breath. Nothing aggravates the symptoms. Nothing relieves the symptoms. She has tried nothing for the symptoms. The treatment provided significant relief.    Past Medical History:  Diagnosis Date  . Anemia   .  Anxiety   . Depression   . GERD (gastroesophageal reflux disease)   . H. pylori infection   . Hx of cardiovascular stress test    Myoview 6/12:  diaph atten, no ischemia, low risk  . Hx of echocardiogram    Echo 6/12:  EF 50-55%, mild diast dysfn, mild LAE, mild MR  . Hypertension   . Irregular heart rhythm    unclear what arrhythmia  . Migraine   . Pneumothorax    pneumonia  . Supraventricular tachycardia, paroxysmal (HCC)    a. visit to EF 03/2011;  b. Event monitor (@[facility] ) 6/12:  PVCs/PACs, 3 episodes of WCT (SVT with aberrancy)  . Torus palatinus     Patient Active Problem List   Diagnosis Date Noted  . Acute bacterial sinusitis 08/17/2015  . UTI (lower urinary tract infection) 03/24/2015  . Dyspnea 01/19/2015  . Fatigue 01/19/2015  . Compression fracture of body of thoracic vertebra 01/19/2015  . Upper abdominal pain 12/17/2014  . Abnormal CT scan 12/17/2014  . Right sided abdominal pain 12/17/2014  . Flank pain 12/17/2014  . Gastritis and gastroduodenitis 12/17/2014  . Other chest pain 11/24/2014  . CAP (community acquired pneumonia) 11/18/2014  . Reactive airway disease that is not asthma 11/18/2014  . Pulmonary nodules 11/18/2014  . History of pneumothorax 11/10/2014  . Gastroesophageal reflux disease without esophagitis 07/27/2014  . Right flank pain 05/01/2014  . SVT (supraventricular tachycardia) (HCC) 12/27/2012  . DEPRESSION 11/17/2009  . Anemia 02/03/2009  . EXOSTOSIS  OF JAW 01/28/2009  . Anxiety state 01/21/2009  . Migraine without aura 01/21/2009  . Essential hypertension 01/21/2009    Past Surgical History:  Procedure Laterality Date  . ABDOMINAL HYSTERECTOMY  2005  . CESAREAN SECTION     x 2  . PLEURAL SCARIFICATION      OB History    Gravida Para Term Preterm AB Living   2 2           SAB TAB Ectopic Multiple Live Births                   Home Medications    Prior to Admission medications   Medication Sig Start Date End Date Taking?  Authorizing Provider  Multiple Vitamin (MULTIVITAMIN) tablet Take 1 tablet by mouth daily.    Historical Provider, MD  oseltamivir (TAMIFLU) 75 MG capsule Take 1 capsule (75 mg total) by mouth 2 (two) times daily. 03/03/16   Donato Schultz, DO    Family History Family History  Problem Relation Age of Onset  . Depression Mother   . Diabetes Mother   . Heart disease Mother   . Hypertension Mother   . Arthritis Mother   . Mental illness Mother   . Hypertension Father   . Coronary artery disease Father   . Heart disease Father   . Heart failure Brother   . Heart disease Brother   . Hypertension Brother     x2  . Hypertension Sister   . Stomach cancer Neg Hx     Social History Social History  Substance Use Topics  . Smoking status: Never Smoker  . Smokeless tobacco: Never Used  . Alcohol use No     Allergies   Review of patient's allergies indicates no known allergies.   Review of Systems Review of Systems  Respiratory: Negative for shortness of breath.   Cardiovascular: Negative for chest pain.  Gastrointestinal: Negative for abdominal pain.  Neurological: Positive for weakness and headaches.  All other systems reviewed and are negative.    Physical Exam Updated Vital Signs BP (!) 169/105   Pulse 66   Temp 98.8 F (37.1 C) (Oral)   Resp 13   Ht 5\' 10"  (1.778 m)   Wt 155 lb (70.3 kg)   SpO2 98%   BMI 22.24 kg/m   Physical Exam  Constitutional: She is oriented to person, place, and time. She appears well-developed and well-nourished. No distress.  HENT:  Head: Normocephalic and atraumatic.  Mouth/Throat: Oropharynx is clear and moist.  Eyes: Conjunctivae and EOM are normal. Pupils are equal, round, and reactive to light.  Neck: Normal range of motion. Neck supple.  Cardiovascular: Normal rate, regular rhythm and intact distal pulses.   No murmur heard. Pulmonary/Chest: Effort normal and breath sounds normal. No respiratory distress. She has no  wheezes. She has no rales.  Abdominal: Soft. She exhibits no distension. There is no tenderness. There is no rebound and no guarding.  Musculoskeletal: Normal range of motion. She exhibits no edema or tenderness.  Neurological: She is alert and oriented to person, place, and time. She has normal strength. No cranial nerve deficit or sensory deficit. Coordination normal.  Patient is able to walk to the bathroom without any difficulty or need for assistance  Skin: Skin is warm and dry. No rash noted. No erythema.  Psychiatric: She has a normal mood and affect. Her behavior is normal.  Nursing note and vitals reviewed.    ED Treatments / Results  Labs (all labs ordered are listed, but only abnormal results are displayed) Labs Reviewed  CBC WITH DIFFERENTIAL/PLATELET - Abnormal; Notable for the following:       Result Value   RBC 5.33 (*)    MCV 75.4 (*)    MCH 25.3 (*)    All other components within normal limits  COMPREHENSIVE METABOLIC PANEL - Abnormal; Notable for the following:    Potassium 3.4 (*)    All other components within normal limits  CK  URINALYSIS, ROUTINE W REFLEX MICROSCOPIC (NOT AT [facility])  I-STAT TROPOININ, ED    EKG  EKG Interpretation  Date/Time:  Monday July 17 2016 17:37:06 EDT Ventricular Rate:  62 PR Interval:    QRS Duration: 119 QT Interval:  456 QTC Calculation: 464 R Axis:   -41 Text Interpretation:  Sinus rhythm Left atrial enlargement Incomplete RBBB and LAFB Left ventricular hypertrophy Anterior Q waves, possibly due to LVH Similar ekg Dec 2015 Confirmed by Anitra Lauth  MD, Alphonzo Lemmings ([id number]) on 07/17/2016 5:42:21 PM       Radiology No results found.  Procedures Procedures (including critical care time)  Medications Ordered in ED Medications  hydrochlorothiazide (MICROZIDE) capsule 12.5 mg (not administered)  sodium chloride 0.9 % bolus 500 mL (0 mLs Intravenous Stopped 07/17/16 1916)     Initial Impression / Assessment and Plan / ED  Course  I have reviewed the triage vital signs and the nursing notes.  Pertinent labs & imaging results that were available during my care of the patient were reviewed by me and considered in my medical decision making (see chart for details).  Clinical Course   Patient is a 55 year old female presenting today with one week of worsening fatigue to the point today where she was unable to walk and had a throbbing pounding headache. She denies any unilateral symptoms, speech difficulty, syncope, chest pain or shortness of breath. She does have a history of SVT and dysrhythmia but denies any prolonged palpitations. She has not had any unilateral leg pain or swelling concerning for a PE. She was found today to be extremely hypertensive 200/120 by EMS and states she has never had blood pressure like that before. She has known to the anemic in the past but is not currently on iron. Thyroid and blood counts are checked within the last few months and were normal. She does not take blood pressure medication currently and has been off blood para medication for some time. She has been under an extreme amount of stress for months and states this weekend she had been very busy and has not been eating the way she should. On exam patient has no significant findings. She was able to get up on her own and walk to the bathroom without any signs of weakness or ataxia. Vision is intact. No notable heart murmurs and lungs are clear. EKG is abnormal however it is unchanged from prior EKGs in 2015 most likely related to left ventricular hypertrophy. As no symptoms concerning for an MI. Feel her headache may be related to her elevated blood pressure. CBC, CMP, CK and urine are all within normal limits. There is no sign of anemia today. Troponin is negative. Patient is feeling better and blood pressure has improved but remains elevated. Discussed with patient starting her on a low-dose of hydrochlorothiazide and following up with  her PCP. Patient desires to go home. Recommended rest and staying out of work for the next few days and stressed the importance of following up with  her PCP.  Final Clinical Impressions(s) / ED Diagnoses   Final diagnoses:  Generalized weakness  Essential hypertension    New Prescriptions Discharge Medication List as of 07/17/2016  7:44 PM    START taking these medications   Details  hydrochlorothiazide (HYDRODIURIL) 12.5 MG tablet Take 1 tablet (12.5 mg total) by mouth daily., Starting Mon 07/17/2016, Print         Gwyneth SproutWhitney Agueda Houpt, MD 07/17/16 2050

## 2016-07-18 ENCOUNTER — Telehealth: Payer: Self-pay | Admitting: Family

## 2016-07-18 MED FILL — HYDROCHLOROTHIAZIDE 12.5 MG: 12.5 | 30 days supply | Qty: 30 | Fill #0

## 2016-07-18 NOTE — Telephone Encounter (Signed)
Please contact pt to arrange follow up with me in 2 weeks.

## 2016-07-27 NOTE — Telephone Encounter (Signed)
lvm advising patient to schedule an appointment.

## 2016-08-02 ENCOUNTER — Encounter: Payer: Self-pay | Admitting: Family

## 2016-08-02 ENCOUNTER — Ambulatory Visit (INDEPENDENT_AMBULATORY_CARE_PROVIDER_SITE_OTHER): Payer: BC Managed Care – PPO | Admitting: Family

## 2016-08-02 ENCOUNTER — Telehealth: Payer: Self-pay | Admitting: Family

## 2016-08-02 VITALS — BP 125/74 | HR 64 | Temp 98.5°F | Resp 16 | Ht 70.0 in | Wt 153.4 lb

## 2016-08-02 DIAGNOSIS — R5383 Other fatigue: Secondary | ICD-10-CM | POA: Diagnosis not present

## 2016-08-02 DIAGNOSIS — I10 Essential (primary) hypertension: Secondary | ICD-10-CM | POA: Diagnosis not present

## 2016-08-02 DIAGNOSIS — I499 Cardiac arrhythmia, unspecified: Secondary | ICD-10-CM

## 2016-08-02 DIAGNOSIS — R9431 Abnormal electrocardiogram [ECG] [EKG]: Secondary | ICD-10-CM

## 2016-08-02 LAB — BASIC METABOLIC PANEL
BUN: 15 mg/dL (ref 6–23)
CHLORIDE: 105 meq/L (ref 96–112)
CO2: 31 mEq/L (ref 19–32)
CREATININE: 0.74 mg/dL (ref 0.40–1.20)
Calcium: 9 mg/dL (ref 8.4–10.5)
GFR: 103.88 mL/min (ref >60.00)
Glucose, Bld: 84 mg/dL (ref 70–99)
Potassium: 3.7 mEq/L (ref 3.5–5.1)
Sodium: 142 mEq/L (ref 135–145)

## 2016-08-02 LAB — TSH: TSH: 0.55 u[IU]/mL (ref 0.35–4.50)

## 2016-08-02 MED ORDER — HYDROCHLOROTHIAZIDE 12.5 MG PO TABS: 12.5000 mg | ORAL_TABLET | Freq: Every day | ORAL | 5 refills | Status: DC

## 2016-08-02 NOTE — Assessment & Plan Note (Signed)
Stable on hctz, continue same.  

## 2016-08-02 NOTE — Progress Notes (Signed)
Pre visit review using our clinic review tool, if applicable. No additional management support is needed unless otherwise documented below in the visit note. 

## 2016-08-02 NOTE — Progress Notes (Signed)
Subjective:    Patient ID: [name], female    DOB: Feb 25, 1961, 55 y.o.   MRN: [id number]  HPI  [name] is a 55 yr old female who presents today for follow up of her hypertension.  Ws started on hctz after bp was found ot elevated in the ED. ED record is reviewed. Had generalized weakness which resolved.  K+ was noted to be low.     Currently maintained on hctz. BP Readings from Last 3 Encounters:  08/02/16 125/74  07/17/16 157/94  03/03/16 (!) 148/87   She also reports left leg weakness/numbness/ left knee swelling x 2 weeks.    Notes increased stress at work and that her son had been missing x 2 months. She later learned that he was in prison. This was a shock for her.    Review of Systems  Respiratory: Negative for shortness of breath.   Cardiovascular: Negative for chest pain.   Past Medical History:  Diagnosis Date  . Anemia   . Anxiety   . Depression   . GERD (gastroesophageal reflux disease)   . H. pylori infection   . Hx of cardiovascular stress test    Myoview 6/12:  diaph atten, no ischemia, low risk  . Hx of echocardiogram    Echo 6/12:  EF 50-55%, mild diast dysfn, mild LAE, mild MR  . Hypertension   . Irregular heart rhythm    unclear what arrhythmia  . Migraine   . Pneumothorax    pneumonia  . Supraventricular tachycardia, paroxysmal (HCC)    a. visit to EF 03/2011;  b. Event monitor (@[facility] ) 6/12:  PVCs/PACs, 3 episodes of WCT (SVT with aberrancy)  . Torus palatinus      Social History   Social History  . Marital status: Legally Separated    Spouse name: N/A  . Number of children: 2  . 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 Smoking status: Never Smoker  . Smokeless tobacco: Never Used  . Alcohol use No  . Drug use: No  . Sexual activity: Yes   Other Topics Concern  . Not on file   Social History Narrative  . No narrative on file      Past Surgical History:  Procedure Laterality Date  . ABDOMINAL HYSTERECTOMY  2005  . CESAREAN SECTION     x 2  . PLEURAL SCARIFICATION      Family History  Problem Relation Age of Onset  . Depression Mother   . Diabetes Mother   . Heart disease Mother   . Hypertension Mother   . Arthritis Mother   . Mental illness Mother   . Hypertension Father   . Coronary artery disease Father   . Heart disease Father   . Heart failure Brother   . Heart disease Brother   . Hypertension Brother     x2  . Hypertension Sister   . Stomach cancer Neg Hx     No Known Allergies  Current Outpatient Prescriptions on File Prior to Visit  Medication Sig Dispense Refill  . hydrochlorothiazide (HYDRODIURIL) 12.5 MG tablet Take 1 tablet (12.5 mg total) by mouth daily. 30 tablet 0  . Multiple Vitamin (MULTIVITAMIN) tablet Take 1 tablet by mouth daily.    . [DISCONTINUED] omeprazole (PRILOSEC) 20 MG capsule Take 1 capsule (20 mg total) by mouth 2 (two) times daily before a meal. (Patient not  taking: Reported on 01/19/2015) 60 capsule 1   No current facility-administered medications on file prior to visit.     BP 125/74 (BP Location: Right Arm, Patient Position: Sitting, Cuff Size: Normal)   Pulse 64   Temp 98.5 F (36.9 C) (Oral)   Resp 16   Ht 5\' 10"  (1.778 m)   Wt 153 lb 6.4 oz (69.6 kg)   SpO2 100%   BMI 22.01 kg/m       Objective:   Physical Exam  Constitutional: She appears well-developed and well-nourished.  Cardiovascular: Normal heart sounds.   No murmur heard. Irregular rate  Pulmonary/Chest: Effort normal and breath sounds normal. No respiratory distress. She has no wheezes.  Musculoskeletal:       Cervical back: She exhibits no tenderness.       Thoracic back: She exhibits no tenderness.       Lumbar back: She exhibits no tenderness.  Neurological:  Bilateral LE strength is 5/5 Sensation intact and equal to bilateral LE.   Psychiatric: She has a normal mood and  affect. Her behavior is normal. Judgment and thought content normal.          Assessment & Plan:  Fatigue/Weakness- suspect stress is playing a role. Normal neuro exam today. Advised pt to call if she develops increased weakness, numbness or if back pain. TSH is normal.   Hypokalemia- will obtain follow up bmet, especially now that she is on hctz, expect we may need to add a K supplement. Follow up K+ is normal.   Irregular heart rate- EKG notes normal sinus rhythm.  EKG is reviewed and Appears changed compared to previous EKG (New TWI in V1).  Has strong family hx of CAD. Will refer to cardiology for further evaluation.

## 2016-08-02 NOTE — Telephone Encounter (Signed)
Please let pt know that I reviewed her EKG and it looks a bit different from her previous EKG. Given her family hx of heart disease I would like her to meet with cardiology. Is she already established with cardiology and if so who has she seen in the past.

## 2016-08-02 NOTE — Patient Instructions (Addendum)
Please complete lab work prior to leaving. Call if you develop increased weakness, numbness or if you develop back pain.

## 2016-08-03 NOTE — Telephone Encounter (Signed)
Spoke w/ Pt, informed her of EKG. She has previously seen cardiologist [facility], Dr. Bryan Lemmaavid Harding. Pt has agreed to see cardiology and will await call from referral.

## 2016-08-28 DIAGNOSIS — Z23 Encounter for immunization: Secondary | ICD-10-CM | POA: Diagnosis not present

## 2016-09-15 ENCOUNTER — Ambulatory Visit: Payer: BC Managed Care – PPO | Admitting: Internal Medicine

## 2016-09-22 ENCOUNTER — Telehealth: Payer: Self-pay | Admitting: Family

## 2016-09-22 NOTE — Telephone Encounter (Signed)
-----   Message from Fayrene HelperWilliam W Yancey sent at 09/21/2016  4:32 PM EDT ----- Hello,  Patient voiced she does not have the funds to make an appointment and she will call later once she receives the funds to afford an appointment and she does not know when that'll be. I will remove the patient from our workqueue list until further notice.  Thanks!

## 2016-10-02 ENCOUNTER — Ambulatory Visit: Payer: BC Managed Care – PPO | Admitting: Family

## 2016-10-02 MED FILL — HYDROCHLOROTHIAZIDE 12.5 MG: 12.5 | 30 days supply | Qty: 30 | Fill #0

## 2016-10-13 ENCOUNTER — Ambulatory Visit: Payer: BC Managed Care – PPO | Admitting: Cardiology

## 2016-11-02 MED FILL — HYDROCHLOROTHIAZIDE 12.5 MG: 12.5 | 30 days supply | Qty: 30 | Fill #1

## 2016-12-01 MED FILL — HYDROCHLOROTHIAZIDE 12.5 MG: 12.5 | 30 days supply | Qty: 30 | Fill #2

## 2017-01-05 MED FILL — HYDROCHLOROTHIAZIDE 12.5 MG: 12.5 | 30 days supply | Qty: 30 | Fill #3

## 2017-02-07 MED FILL — HYDROCHLOROTHIAZIDE 12.5 MG: 12.5 | 30 days supply | Qty: 30 | Fill #4

## 2017-03-12 MED FILL — HYDROCHLOROTHIAZIDE 12.5 MG: 12.5 | 30 days supply | Qty: 30 | Fill #5

## 2017-04-10 ENCOUNTER — Other Ambulatory Visit: Payer: Self-pay | Admitting: Family

## 2017-04-11 NOTE — Telephone Encounter (Signed)
30 day supply of HCTZ sent to pharmacy. Pt was due for CPE in October.  Please call pt to schedule appt. Thanks!

## 2017-04-12 MED FILL — HYDROCHLOROTHIAZIDE 12.5 MG: 12.5 | 30 days supply | Qty: 30 | Fill #0

## 2017-04-12 NOTE — Telephone Encounter (Signed)
Called to make pt aware. She says that she will call back to schedule CPE as soon as possible.

## 2017-05-18 ENCOUNTER — Ambulatory Visit (INDEPENDENT_AMBULATORY_CARE_PROVIDER_SITE_OTHER): Payer: BC Managed Care – PPO | Admitting: Family

## 2017-05-18 ENCOUNTER — Encounter: Payer: Self-pay | Admitting: Family

## 2017-05-18 VITALS — BP 129/89 | HR 74 | Temp 98.6°F | Resp 16 | Ht 70.0 in | Wt 163.0 lb

## 2017-05-18 DIAGNOSIS — I10 Essential (primary) hypertension: Secondary | ICD-10-CM | POA: Diagnosis not present

## 2017-05-18 DIAGNOSIS — Z0000 Encounter for general adult medical examination without abnormal findings: Secondary | ICD-10-CM

## 2017-05-18 LAB — BASIC METABOLIC PANEL
BUN: 12 mg/dL (ref 6–23)
CALCIUM: 9.1 mg/dL (ref 8.4–10.5)
CO2: 26 mEq/L (ref 19–32)
CREATININE: 0.92 mg/dL (ref 0.40–1.20)
Chloride: 109 mEq/L (ref 96–112)
GFR: 80.57 mL/min (ref >60.00)
Glucose, Bld: 76 mg/dL (ref 70–99)
Potassium: 3.4 mEq/L — ABNORMAL LOW (ref 3.5–5.1)
Sodium: 142 mEq/L (ref 135–145)

## 2017-05-18 NOTE — Patient Instructions (Addendum)
OK to remain off of HCTZ. Complete lab work prior to leaving.

## 2017-05-18 NOTE — Assessment & Plan Note (Addendum)
BP OK despite being off of low dose HCTZ x 4 days. Will give trial off of hctz, plan to repeat BP in the office in 1 month. In the meantime, patient will check her BP daily at home. Obtain follow up bmet.

## 2017-05-18 NOTE — Progress Notes (Signed)
Subjective:    Patient ID: [name], female    DOB: 09/15/1961, 56 y.o.   MRN: [id number]  HPI  [name] is a 56 yr old female who presents today for follow up of her hypertension.  Hypertension- her only BP medication is hctz 12.5mg  once daily.  She ran out of her medication 4 days ago. Denies swelling.  BP Readings from Last 3 Encounters:  05/18/17 129/89  08/02/16 125/74  07/17/16 157/94      Review of Systems See HPI  Past Medical History:  Diagnosis Date  . Anemia   . Anxiety   . Depression   . GERD (gastroesophageal reflux disease)   . H. pylori infection   . Hx of cardiovascular stress test    Myoview 6/12:  diaph atten, no ischemia, low risk  . Hx of echocardiogram    Echo 6/12:  EF 50-55%, mild diast dysfn, mild LAE, mild MR  . Hypertension   . Irregular heart rhythm    unclear what arrhythmia  . Migraine   . Pneumothorax    pneumonia  . Supraventricular tachycardia, paroxysmal (HCC)    a. visit to EF 03/2011;  b. Event monitor (@[facility] ) 6/12:  PVCs/PACs, 3 episodes of WCT (SVT with aberrancy)  . Torus palatinus      Social History   Social History  . Marital status: Legally Separated    Spouse name: N/A  . Number of children: 2  . Years of education: N/A   Occupational History  . 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 Smoking status: Never Smoker  . Smokeless tobacco: Never Used  . Alcohol use No  . Drug use: No  . Sexual activity: Yes   Other Topics Concern  . Not on file   Social History Narrative  . No narrative on file    Past Surgical History:  Procedure Laterality Date  . ABDOMINAL HYSTERECTOMY  2005  . CESAREAN SECTION     x 2  . PLEURAL SCARIFICATION      Family History  Problem Relation Age of Onset  . Depression Mother   . Diabetes Mother   . Heart disease Mother   . Hypertension Mother   . Arthritis Mother   . Mental illness Mother   . Hypertension Father   .  Coronary artery disease Father   . Heart disease Father   . Heart failure Brother   . Heart disease Brother   . Hypertension Brother        x2  . Hypertension Sister   . Stomach cancer Neg Hx     No Known Allergies  Current Outpatient Prescriptions on File Prior to Visit  Medication Sig Dispense Refill  . hydrochlorothiazide (HYDRODIURIL) 12.5 MG tablet TAKE 1 TABLET (12.5 MG TOTAL) BY MOUTH DAILY. 30 tablet 0  . Multiple Vitamin (MULTIVITAMIN) tablet Take 1 tablet by mouth daily.    . [DISCONTINUED] omeprazole (PRILOSEC) 20 MG capsule Take 1 capsule (20 mg total) by mouth 2 (two) times daily before a meal. (Patient not taking: Reported on 01/19/2015) 60 capsule 1   No current facility-administered medications on file prior to visit.     BP 129/89 (BP Location: Right Arm, Cuff Size: Normal)   Pulse 74   Temp 98.6 F (37 C) (Oral)   Resp 16   Ht 5\' 10"  (1.778 m)   Wt 163 lb (73.9 kg)   SpO2 100%   BMI 23.39 kg/m  Objective:   Physical Exam  Constitutional: She is oriented to person, place, and time. She appears well-developed and well-nourished.  Cardiovascular: Normal rate, regular rhythm and normal heart sounds.   No murmur heard. Pulmonary/Chest: Effort normal and breath sounds normal. No respiratory distress. She has no wheezes.  Musculoskeletal: She exhibits no edema.  Neurological: She is alert and oriented to person, place, and time.  Skin: Skin is warm and dry.  Psychiatric: She has a normal mood and affect. Her behavior is normal. Judgment and thought content normal.          Assessment & Plan:

## 2017-05-18 NOTE — Addendum Note (Signed)
Addended by: Sandford Craze'SULLIVAN, Cali Hope on: 05/18/2017 02:06 PM   Modules accepted: Level of Service

## 2017-05-21 ENCOUNTER — Telehealth: Payer: Self-pay | Admitting: Family

## 2017-05-21 DIAGNOSIS — E876 Hypokalemia: Secondary | ICD-10-CM

## 2017-05-21 NOTE — Telephone Encounter (Signed)
Potassium is low. Advise pt to focus on potassium rich foods and repeat bmet in 1 week, dx hypokalemia.   High potassium content foods  Highest content (>25 meq/100 g) High content (>6.2 meq/100 g)   Vegetables   Spinach   Tomatoes   Broccoli   Winter squash   Beets   Carrots   Cauliflower   Potatoes   Fruits   Bananas  Dried figs Cantaloupe  Molasses Kiwis  Seaweed Oranges  Very high content (>12.5 meq/100 g) Mangos  Dried fruits (dates, prunes) Meats  Nuts Ground beef  Avocados Steak  Bran cereals Pork  Wheat germ Veal  Lima beans Randa LynnLamb

## 2017-05-22 NOTE — Telephone Encounter (Addendum)
Notified pt and she voices understanding. States she will call back to schedule lab appt as she is currently at work. Future lab order entered.

## 2017-05-25 ENCOUNTER — Ambulatory Visit (HOSPITAL_BASED_OUTPATIENT_CLINIC_OR_DEPARTMENT_OTHER)
Admission: RE | Admit: 2017-05-25 | Discharge: 2017-05-25 | Disposition: A | Discharge status: Home / Self Care | Payer: BC Managed Care – PPO | Source: Ambulatory Visit | Attending: Family | Admitting: Family

## 2017-05-25 DIAGNOSIS — Z0000 Encounter for general adult medical examination without abnormal findings: Secondary | ICD-10-CM

## 2017-05-25 DIAGNOSIS — Z1231 Encounter for screening mammogram for malignant neoplasm of breast: Secondary | ICD-10-CM | POA: Diagnosis not present

## 2017-06-05 ENCOUNTER — Telehealth: Payer: Self-pay | Admitting: Family

## 2017-06-05 ENCOUNTER — Encounter: Payer: Self-pay | Admitting: Internal Medicine

## 2017-06-05 ENCOUNTER — Ambulatory Visit (INDEPENDENT_AMBULATORY_CARE_PROVIDER_SITE_OTHER): Payer: BC Managed Care – PPO | Admitting: Internal Medicine

## 2017-06-05 VITALS — BP 150/100 | HR 75 | Temp 98.1°F | Wt 166.5 lb

## 2017-06-05 DIAGNOSIS — R5383 Other fatigue: Secondary | ICD-10-CM | POA: Diagnosis not present

## 2017-06-05 DIAGNOSIS — R42 Dizziness and giddiness: Secondary | ICD-10-CM

## 2017-06-05 DIAGNOSIS — I10 Essential (primary) hypertension: Secondary | ICD-10-CM | POA: Diagnosis not present

## 2017-06-05 DIAGNOSIS — F439 Reaction to severe stress, unspecified: Secondary | ICD-10-CM

## 2017-06-05 MED ORDER — HYDROCHLOROTHIAZIDE 12.5 MG PO TABS: 12.5000 mg | ORAL_TABLET | Freq: Every day | ORAL | 0 refills | Status: DC

## 2017-06-05 MED FILL — HYDROCHLOROTHIAZIDE 12.5 MG: 12.5 | 30 days supply | Qty: 30 | Fill #0

## 2017-06-05 NOTE — Telephone Encounter (Signed)
Patient Name: Renee CoderCYNTHIA Ferrero DOB: May 12, 1961 Initial Comment pt is feeling bad - stopped taking her blood pressure medication. Would like to be advised on what to do. Nurse Assessment Nurse: Delford FieldWright, RN, Ivin BootyJoshua Date/Time (Eastern Time): 06/05/2017 2:30:08 PM Confirm and document reason for call. If symptomatic, describe symptoms. ---Caller states that she has stopped taking her blood pressure medication for about 3 weeks or longer. Her MD said that since she was out of it so long, to just stay off of it. She has body aches all over, sore throat, head hurts, and tired/achy. BP today is 160/129 Does the patient have any new or worsening symptoms? ---Yes Will a triage be completed? ---Yes Related visit to physician within the last 2 weeks? ---Yes Does the PT have any chronic conditions? (i.e. diabetes, asthma, etc.) ---No Is this a behavioral health or substance abuse call? ---No Guidelines Guideline Title Affirmed Question Affirmed Notes High Blood Pressure [1] Systolic BP >= 200 OR Diastolic >= 120 AND [2] having NO cardiac or neurologic symptoms Final Disposition User See Physician within 4 Hours (or PCP triage) Delford FieldWright, RN, Ivin BootyJoshua Comments No appt with PCP or primary office, [facility] Dr[facility] Fabian SharpPanosh at 3:30p Referrals GO TO FACILITY OTHER - SPECIFY Disagree/Comply: Comply

## 2017-06-05 NOTE — Progress Notes (Signed)
Chief Complaint  Patient presents with  . Hypertension  . Headache  . Dizziness    HPI: [name] 56 y.o.  SDA acute appt sent in by team health . PCP NA    Hasn't felt well for the last 3 or 4 days complaining of soreness and achiness and evening sore throat swords without documented fever. Has been taking TheraFlu last time it about 2:00 today initially it helped her feel better but not since then. Was out  Ofhctz  med  Low dose  For 5 days at her last OV  So  Off med    Not sure if bp up because feeling bad or other  illness  Or off meds .  And bp readings  Was 160/  Range  And light headed. In past days   bp 140 150 range   Has lots of stress and has been on vacation  And feeling bad  Staying in cause of heat.  No vomiting neuro events falling  No racing heart has palpitations anyway.  Has had low borderline potassium no cramps.   Last blood tests   2-3 weeks ago  Neg tad  ROS: See pertinent positives and negatives per HPI. No cp sob  Due for  Cards check  Past Medical History:  Diagnosis Date  . Anemia   . Anxiety   . Depression   . GERD (gastroesophageal reflux disease)   . H. pylori infection   . Hx of cardiovascular stress test    Myoview 6/12:  diaph atten, no ischemia, low risk  . Hx of echocardiogram    Echo 6/12:  EF 50-55%, mild diast dysfn, mild LAE, mild MR  . Hypertension   . Irregular heart rhythm    unclear what arrhythmia  . Migraine   . Pneumothorax    pneumonia  . Supraventricular tachycardia, paroxysmal (HCC)    a. visit to [facility] 03/2011;  b. Event monitor (@[facility] ) 6/12:  PVCs/PACs, 3 episodes of WCT (SVT with aberrancy)  . Torus palatinus     Family History  Problem Relation Age of Onset  . Depression Mother   . Diabetes Mother   . Heart disease Mother   . Hypertension Mother   . Arthritis Mother   . Mental illness Mother   . Hypertension Father   . Coronary artery disease Father   . Heart disease Father   . Heart failure Brother   .  Heart disease Brother   . Hypertension Brother        x2  . Hypertension Sister   . Stomach cancer Neg Hx     Social History   Social History  . Marital status: Legally Separated    Spouse name: N/A  . Number of children: 2  . Years of education: N/A   Occupational History  . 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 Smoking status: Never Smoker  . Smokeless tobacco: Never Used  . Alcohol use No  . Drug use: No  . Sexual activity: Yes   Other Topics Concern  . None   Social History Narrative  . None    Outpatient Medications Prior to Visit  Medication Sig Dispense Refill  . Multiple Vitamin (MULTIVITAMIN) tablet Take 1 tablet by mouth daily.     No facility-administered medications prior to visit.      EXAM:  BP (!) 150/100 (BP Location: Left Arm, Patient Position: Sitting, Cuff Size: Normal)  Pulse 75   Temp 98.1 F (36.7 C) (Oral)   Wt 166 lb 8 oz (75.5 kg)   BMI 23.89 kg/m   Body mass index is 23.89 kg/m.  GENERAL: vitals reviewed and listed above, alert, oriented, appears well hydrated and in no acute distress  Mildly   Ill nl speech non toxic  HEENT: atraumatic, conjunctiva  clear, no obvious abnormalities on inspection of external nose and ears tm clear OP : no lesion edema or exudate  NECK: no obvious masses on inspection palpation  LUNGS: clear to auscultation bilaterally, no wheezes, rales or rhonchi, good air movement CV: HRRR, ocass premature beat   no clubbing cyanosis or  peripheral edema nl cap refill  MS: moves all extremities without noticeable focal  Abnormality Abdomen:  Sof,t normal bowel sounds without hepatosplenomegaly, no guarding rebound or masses no CVA tenderness  Neuro no focal;  PSYCH: pleasant and cooperative, Lab Results  Component Value Date   WBC 5.2 07/17/2016   HGB 13.5 07/17/2016   HCT 40.2 07/17/2016   PLT 239 07/17/2016   GLUCOSE 76 05/18/2017   CHOL 191 01/19/2010    TRIG 46 01/19/2010   HDL 58 01/19/2010   LDLCALC 124 (H) 01/19/2010   ALT 17 07/17/2016   AST 21 07/17/2016   NA 142 05/18/2017   K 3.4 (L) 05/18/2017   CL 109 05/18/2017   CREATININE 0.92 05/18/2017   BUN 12 05/18/2017   CO2 26 05/18/2017   TSH 0.55 08/02/2016   INR 0.98 03/24/2011    ASSESSMENT AND PLAN:  Discussed the following assessment and plan:  Hypertension, unspecified type - recent relapsing sx but interim sx  off med and  flu med also   Other fatigue  Dizziness - poss from theraflu plus illness   Stress I suspect some of her symptoms are from TheraFlu she had been taking for possible illness. Her blood pressure elevation seems to be recent in could've been affected by medication nevertheless will go back on low-dose HCTZ that she had been on up until the last month. Her potassium will have to be followed up she does have stress and there is a financial burden with co-pays and seeing specialist. Follow-up visit with PCP or send in readings in 2 week about progress and plan. Did notify her that she would have to have her potassium rechecked at some point.  She has an occasional premature beat but no recent heart racing tachycardia. -Patient advised to return or notify health care team  if symptoms worsen ,persist or new concerns arise.  Patient Instructions  I think this is a combination of  Illness   Side effect of the theraflu or cold med and   bp elevation .  Stay hydrated  And go back on medication .   Low dose   Then plan  FU  readings sending to   [facility]  ... Caution with heat.   Depending on plan will need repeat  BMP potassium check .     Neta Mends. Fifi Schindler M.D.

## 2017-06-05 NOTE — Patient Instructions (Addendum)
I think this is a combination of  Illness   Side effect of the theraflu or cold med and   bp elevation .  Stay hydrated  And go back on medication .   Low dose   Then plan  FU  readings sending to   [facility]  ... Caution with heat.   Depending on plan will need repeat  BMP potassium check .

## 2017-06-05 NOTE — Telephone Encounter (Signed)
Since pt has [facility] we will defer medication determination to them at visit today.

## 2017-06-05 NOTE — Telephone Encounter (Signed)
Pt called in to request a refill on her BP medication. Pt says that she haven't been taking medication because at her last visit her numbers were good. Pt says that she have started feeling bad. I asked pt to elaborate, she said that she's not sure how. No dizziness just feels bad. I informed pt that I will send message to provider to make her aware. I also transferred pt to team health for further triaging due to her feeling bad. She would like to know if her not taking medication could be the cause of her not feeling well? She said that she would like to have Rx sent to pharmacy below?   Pharmacy: [facility]

## 2017-06-28 ENCOUNTER — Telehealth: Payer: Self-pay | Admitting: *Deleted

## 2017-06-28 ENCOUNTER — Other Ambulatory Visit: Payer: Self-pay | Admitting: Internal Medicine

## 2017-06-28 MED ORDER — HYDROCHLOROTHIAZIDE 12.5 MG PO TABS: 12.5000 mg | ORAL_TABLET | Freq: Every day | ORAL | 5 refills | Status: DC

## 2017-06-28 MED FILL — HYDROCHLOROTHIAZIDE 12.5 MG: 12.5 | 30 days supply | Qty: 30 | Fill #0

## 2017-06-28 NOTE — Telephone Encounter (Signed)
[facility] 07/26

## 2017-08-03 MED FILL — HYDROCHLOROTHIAZIDE 12.5 MG: 12.5 | 30 days supply | Qty: 30 | Fill #1

## 2017-08-08 ENCOUNTER — Ambulatory Visit (INDEPENDENT_AMBULATORY_CARE_PROVIDER_SITE_OTHER): Payer: BC Managed Care – PPO | Admitting: Family

## 2017-08-08 VITALS — BP 130/90 | HR 69 | Temp 98.4°F | Ht 70.0 in | Wt 165.0 lb

## 2017-08-08 DIAGNOSIS — K219 Gastro-esophageal reflux disease without esophagitis: Secondary | ICD-10-CM

## 2017-08-08 LAB — HEPATIC FUNCTION PANEL
ALT: 14 U/L (ref 0–35)
AST: 17 U/L (ref 0–37)
Albumin: 4.3 g/dL (ref 3.5–5.2)
Alkaline Phosphatase: 66 U/L (ref 39–117)
Bilirubin, Direct: 0.1 mg/dL (ref 0.0–0.3)
Total Bilirubin: 0.5 mg/dL (ref 0.2–1.2)
Total Protein: 7 g/dL (ref 6.0–8.3)

## 2017-08-08 LAB — CBC WITH DIFFERENTIAL/PLATELET
BASOS PCT: 0.8 % (ref 0.0–3.0)
Basophils Absolute: 0 10*3/uL (ref 0.0–0.1)
Eosinophils Absolute: 0.2 10*3/uL (ref 0.0–0.7)
Eosinophils Relative: 3.4 % (ref 0.0–5.0)
HEMATOCRIT: 39.4 % (ref 36.0–46.0)
Hemoglobin: 13.1 g/dL (ref 12.0–15.0)
Lymphocytes Relative: 34.6 % (ref 12.0–46.0)
Lymphs Abs: 1.7 10*3/uL (ref 0.7–4.0)
MCHC: 33.1 g/dL (ref 30.0–36.0)
MCV: 75.6 fl — ABNORMAL LOW (ref 78.0–100.0)
MONO ABS: 0.4 10*3/uL (ref 0.1–1.0)
Monocytes Relative: 7.9 % (ref 3.0–12.0)
NEUTROS ABS: 2.7 10*3/uL (ref 1.4–7.7)
Neutrophils Relative %: 53.3 % (ref 43.0–77.0)
PLATELETS: 282 10*3/uL (ref 150.0–400.0)
RBC: 5.21 Mil/uL — ABNORMAL HIGH (ref 3.87–5.11)
RDW: 15.7 % — AB (ref 11.5–15.5)
WBC: 5 10*3/uL (ref 4.0–10.5)

## 2017-08-08 LAB — LIPASE: LIPASE: 43 U/L (ref 11.0–59.0)

## 2017-08-08 MED ORDER — PANTOPRAZOLE SODIUM 40 MG PO TBEC: 40.0000 mg | DELAYED_RELEASE_TABLET | Freq: Every day | ORAL | 3 refills | Status: DC

## 2017-08-08 NOTE — Progress Notes (Signed)
Subjective:    Patient ID: [name], female    DOB: [birth year], 58 y.o.   MRN: [id number]  HPI  [name] is a 56 yr old female who presents today with c/o burning sensation in stomach and radiates up into her esophagus, worse when laying down.  Symptoms have been present for several weeks. Reports that she is feeling better today but "I'm not eating." Has tried baking soda, pepto bismol.  These measures helped "at first."  She denies vomiting. She did have some nausea on Saturday night.   Review of Systems See HPI  Past Medical History:  Diagnosis Date  . Anemia   . Anxiety   . Depression   . GERD (gastroesophageal reflux disease)   . H. pylori infection   . Hx of cardiovascular stress test    Myoview 6/12:  diaph atten, no ischemia, low risk  . Hx of echocardiogram    Echo 6/12:  EF 50-55%, mild diast dysfn, mild LAE, mild MR  . Hypertension   . Irregular heart rhythm    unclear what arrhythmia  . Migraine   . Pneumothorax    pneumonia  . Supraventricular tachycardia, paroxysmal (HCC)    a. visit to EF 03/2011;  b. Event monitor (@[facility] ) 6/12:  PVCs/PACs, 3 episodes of WCT (SVT with aberrancy)  . Torus palatinus      Social History   Social History  . Marital status: Legally Separated    Spouse name: N/A  . Number of children: 2  . Years of education: N/A   Occupational History  . 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 Smoking status: Never Smoker  . Smokeless tobacco: Never Used  . Alcohol use No  . Drug use: No  . Sexual activity: Yes   Other Topics Concern  . Not on file   Social History Narrative  . No narrative on file    Past Surgical History:  Procedure Laterality Date  . ABDOMINAL HYSTERECTOMY  2005  . CESAREAN SECTION     x 2  . PLEURAL SCARIFICATION      Family History  Problem Relation Age of Onset  . Depression Mother   . Diabetes Mother   . Heart disease Mother   . Hypertension  Mother   . Arthritis Mother   . Mental illness Mother   . Hypertension Father   . Coronary artery disease Father   . Heart disease Father   . Heart failure Brother   . Heart disease Brother   . Hypertension Brother        x2  . Hypertension Sister   . Stomach cancer Neg Hx     No Known Allergies  Current Outpatient Prescriptions on File Prior to Visit  Medication Sig Dispense Refill  . hydrochlorothiazide (HYDRODIURIL) 12.5 MG tablet Take 1 tablet (12.5 mg total) by mouth daily. 30 tablet 5  . Multiple Vitamin (MULTIVITAMIN) tablet Take 1 tablet by mouth daily.    . [DISCONTINUED] omeprazole (PRILOSEC) 20 MG capsule Take 1 capsule (20 mg total) by mouth 2 (two) times daily before a meal. (Patient not taking: Reported on 01/19/2015) 60 capsule 1   No current facility-administered medications on file prior to visit.     BP 130/90   Pulse 69   Temp 98.4 F (36.9 C) (Oral)   Ht 5\' 10"  (1.778 m)   Wt 165 lb (74.8 kg)   SpO2 97%   BMI 23.68 kg/m  Objective:   Physical Exam  Constitutional: She is oriented to person, place, and time. She appears well-developed and well-nourished.  HENT:  Head: Normocephalic and atraumatic.  Cardiovascular: Normal rate, regular rhythm and normal heart sounds.   No murmur heard. Pulmonary/Chest: Effort normal and breath sounds normal. No respiratory distress. She has no wheezes.  Abdominal: Soft. Bowel sounds are normal. She exhibits no distension and no mass. There is no tenderness. There is no rebound and no guarding.  Musculoskeletal: She exhibits no edema.  Neurological: She is alert and oriented to person, place, and time.  Psychiatric: She has a normal mood and affect. Her behavior is normal. Judgment and thought content normal.          Assessment & Plan:  GERD- symptoms most consistent with GERD. Advised pt to begin protonix.  Discussed foods to avoid. Obtain lft, lipase, cbc.  If abnormal LFT or if symptoms fail to improve  will consider abdominal US.

## 2017-08-08 NOTE — Patient Instructions (Signed)
Complete lab work prior to leaving.  Please begin protonix (antacid medication) once daily. Call if new/worsening symptoms. Try to avoid foods that aggravate GERD (see below).   Food Choices for Gastroesophageal Reflux Disease, Adult When you have gastroesophageal reflux disease (GERD), the foods you eat and your eating habits are very important. Choosing the right foods can help ease the discomfort of GERD. Consider working with a diet and nutrition specialist (dietitian) to help you make healthy food choices. What general guidelines should I follow? Eating plan  Choose healthy foods low in fat, such as fruits, vegetables, whole grains, low-fat dairy products, and lean meat, fish, and poultry.  Eat frequent, small meals instead of three large meals each day. Eat your meals slowly, in a relaxed setting. Avoid bending over or lying down until 2-3 hours after eating.  Limit high-fat foods such as fatty meats or fried foods.  Limit your intake of oils, butter, and shortening to less than 8 teaspoons each day.  Avoid the following: ? Foods that cause symptoms. These may be different for different people. Keep a food diary to keep track of foods that cause symptoms. ? Alcohol. ? Drinking large amounts of liquid with meals. ? Eating meals during the 2-3 hours before bed.  Cook foods using methods other than frying. This may include baking, grilling, or broiling. Lifestyle   Maintain a healthy weight. Ask your health care provider what weight is healthy for you. If you need to lose weight, work with your health care provider to do so safely.  Exercise for at least 30 minutes on 5 or more days each week, or as told by your health care provider.  Avoid wearing clothes that fit tightly around your waist and chest.  Do not use any products that contain nicotine or tobacco, such as cigarettes and e-cigarettes. If you need help quitting, ask your health care provider.  Sleep with the head of  your bed raised. Use a wedge under the mattress or blocks under the bed frame to raise the head of the bed. What foods are not recommended? The items listed may not be a complete list. Talk with your dietitian about what dietary choices are best for you. Grains Pastries or quick breads with added fat. [place] toast. Vegetables Deep fried vegetables. [place] fries. Any vegetables prepared with added fat. Any vegetables that cause symptoms. For some people this may include tomatoes and tomato products, chili peppers, onions and garlic, and horseradish. Fruits Any fruits prepared with added fat. Any fruits that cause symptoms. For some people this may include citrus fruits, such as oranges, grapefruit, pineapple, and lemons. Meats and other protein foods High-fat meats, such as fatty beef or pork, hot dogs, ribs, ham, sausage, salami and bacon. Fried meat or protein, including fried fish and fried chicken. Nuts and nut butters. Dairy Whole milk and chocolate milk. Sour cream. Cream. Ice cream. Cream cheese. Milk shakes. Beverages Coffee and tea, with or without caffeine. Carbonated beverages. Sodas. Energy drinks. Fruit juice made with acidic fruits (such as orange or grapefruit). Tomato juice. Alcoholic drinks. Fats and oils Butter. Margarine. Shortening. Ghee. Sweets and desserts Chocolate and cocoa. Donuts. Seasoning and other foods Pepper. Peppermint and spearmint. Any condiments, herbs, or seasonings that cause symptoms. For some people, this may include curry, hot sauce, or vinegar-based salad dressings. Summary  When you have gastroesophageal reflux disease (GERD), food and lifestyle choices are very important to help ease the discomfort of GERD.  Eat frequent, small meals  instead of three large meals each day. Eat your meals slowly, in a relaxed setting. Avoid bending over or lying down until 2-3 hours after eating.  Limit high-fat foods such as fatty meat or fried foods. This  information is not intended to replace advice given to you by your health care provider. Make sure you discuss any questions you have with your health care provider. Document Released: 11/20/2005 Document Revised: 11/21/2016 Document Reviewed: 11/21/2016 Elsevier Interactive Patient Education  2017 [employer]

## 2017-08-14 ENCOUNTER — Emergency Department (HOSPITAL_BASED_OUTPATIENT_CLINIC_OR_DEPARTMENT_OTHER): Payer: BC Managed Care – PPO

## 2017-08-14 ENCOUNTER — Encounter (HOSPITAL_BASED_OUTPATIENT_CLINIC_OR_DEPARTMENT_OTHER): Payer: Self-pay | Admitting: Emergency Medicine

## 2017-08-14 ENCOUNTER — Emergency Department (HOSPITAL_BASED_OUTPATIENT_CLINIC_OR_DEPARTMENT_OTHER)
Admission: EM | Admit: 2017-08-14 | Discharge: 2017-08-14 | Disposition: A | Discharge status: Home / Self Care | Payer: BC Managed Care – PPO | Attending: Emergency Medicine | Admitting: Emergency Medicine

## 2017-08-14 DIAGNOSIS — Z79899 Other long term (current) drug therapy: Secondary | ICD-10-CM | POA: Diagnosis not present

## 2017-08-14 DIAGNOSIS — I10 Essential (primary) hypertension: Secondary | ICD-10-CM | POA: Insufficient documentation

## 2017-08-14 DIAGNOSIS — R7989 Other specified abnormal findings of blood chemistry: Secondary | ICD-10-CM | POA: Diagnosis not present

## 2017-08-14 DIAGNOSIS — R079 Chest pain, unspecified: Secondary | ICD-10-CM | POA: Diagnosis not present

## 2017-08-14 DIAGNOSIS — R0789 Other chest pain: Secondary | ICD-10-CM | POA: Insufficient documentation

## 2017-08-14 LAB — CBC
HEMATOCRIT: 38.7 % (ref 36.0–46.0)
HEMOGLOBIN: 13.5 g/dL (ref 12.0–15.0)
MCH: 24.9 pg — AB (ref 26.0–34.0)
MCHC: 34.9 g/dL (ref 30.0–36.0)
MCV: 71.4 fL — AB (ref 78.0–100.0)
Platelets: 281 10*3/uL (ref 150–400)
RBC: 5.42 MIL/uL — ABNORMAL HIGH (ref 3.87–5.11)
RDW: 15.8 % — ABNORMAL HIGH (ref 11.5–15.5)
WBC: 5.5 10*3/uL (ref 4.0–10.5)

## 2017-08-14 LAB — BASIC METABOLIC PANEL
Anion gap: 7 (ref 5–15)
BUN: 10 mg/dL (ref 6–20)
CHLORIDE: 103 mmol/L (ref 101–111)
CO2: 30 mmol/L (ref 22–32)
CREATININE: 0.74 mg/dL (ref 0.44–1.00)
Calcium: 9.5 mg/dL (ref 8.9–10.3)
GFR calc Af Amer: >60 mL/min (ref >60)
GFR calc non Af Amer: >60 mL/min (ref >60)
Glucose, Bld: 85 mg/dL (ref 65–99)
POTASSIUM: 3.2 mmol/L — AB (ref 3.5–5.1)
Sodium: 140 mmol/L (ref 135–145)

## 2017-08-14 LAB — TROPONIN I
Troponin I: <0.03 ng/mL (ref <0.03)
Troponin I: <0.03 ng/mL (ref <0.03)

## 2017-08-14 LAB — D-DIMER, QUANTITATIVE: D-Dimer, Quant: 0.59 ug/mL-FEU — ABNORMAL HIGH (ref 0.00–0.50)

## 2017-08-14 MED ORDER — GI COCKTAIL ~~LOC~~
30.0000 mL | Freq: Once | ORAL | Status: AC
  Administered 2017-08-14: 30 mL via ORAL
  Filled 2017-08-14: qty 30

## 2017-08-14 MED ORDER — KETOROLAC TROMETHAMINE 30 MG/ML IJ SOLN
15.0000 mg | Freq: Once | INTRAMUSCULAR | Status: AC
  Administered 2017-08-14: 15 mg via INTRAVENOUS
  Filled 2017-08-14: qty 1

## 2017-08-14 MED ORDER — IOPAMIDOL (ISOVUE-370) INJECTION 76%
100.0000 mL | Freq: Once | INTRAVENOUS | Status: AC | PRN
  Administered 2017-08-14: 100 mL via INTRAVENOUS

## 2017-08-14 MED FILL — PANTOPRAZOLE SOD DR 40 MG T: 40 | 30 days supply | Qty: 30 | Fill #0

## 2017-08-14 NOTE — ED Notes (Signed)
ED Provider at bedside. 

## 2017-08-14 NOTE — ED Triage Notes (Signed)
Centralized chest pain this am.  Has occurred before and seen by PCP.  Was told she had gastric reflux and was prescribed medications.  Pt has not started medication yet.

## 2017-08-14 NOTE — ED Notes (Signed)
Patient transported to CT 

## 2017-08-14 NOTE — ED Notes (Signed)
Pt on monitor 

## 2017-08-14 NOTE — ED Provider Notes (Signed)
[facility] Provider Note   CSN: [id number] Arrival date & time: 08/14/17  4098     History   Chief Complaint Chief Complaint  Patient presents with  . Chest Pain    HPI [name] is a 56 y.o. female.  Patient is a 56 year old female with a history of anemia, GERD, paroxysmal SVT, pneumothorax who is presenting today with chest pain. Patient states that last week she was having a lot of burning in her epigastric and lower chest area and saw her PCP and was diagnosed with GERD. She started to feel better with diet changes and never filled her prescription. This morning she felt in her normal state of health and around 8:00 after she had gotten out of her car she developed a discomfort in the center lower part portion of her chest. She states it is a dull constant pain that is just very uncomfortable. It seems to be slightly worse with taking a deep breath. After the pain started she started to feel a little shortness of breath but she's not sure if that occurred because she was scared her because she was really short of breath. She denies any shortness of breath currently. The pain did not seem to get worse with walking. She ate a muffin after the pain started which did not help her symptoms. However they also did not make them worse. She denies radiation of the pain. She has no abdominal pain. No nausea or vomiting. She has noticed for over a year that she will intermittently have swelling in her left upper leg but there is never any pain. Mother with a history of PE but no other family history. Father died of an MI in his 37s. She has no siblings with MIs or blood clots. She takes no hormonal replacements.  She has not had fever, cough, chills   The history is provided by the patient.    Past Medical History:  Diagnosis Date  . Anemia   . Anxiety   . Depression   . GERD (gastroesophageal reflux disease)   . H. pylori infection   . Hx of cardiovascular stress test    Myoview 6/12:  diaph atten, no ischemia, low risk  . Hx of echocardiogram    Echo 6/12:  EF 50-55%, mild diast dysfn, mild LAE, mild MR  . Hypertension   . Irregular heart rhythm    unclear what arrhythmia  . Migraine   . Pneumothorax    pneumonia  . Supraventricular tachycardia, paroxysmal (HCC)    a. visit to EF 03/2011;  b. Event monitor ( ) 6/12:  PVCs/PACs, 3 episodes of WCT (SVT with aberrancy)  . Torus palatinus     Patient Active Problem List   Diagnosis Date Noted  . Fatigue 01/19/2015  . Compression fracture of body of thoracic vertebra (HCC) 01/19/2015  . Abnormal CT scan 12/17/2014  . Reactive airway disease that is not asthma 11/18/2014  . Pulmonary nodules 11/18/2014  . History of pneumothorax 11/10/2014  . Gastroesophageal reflux disease without esophagitis 07/27/2014  . SVT (supraventricular tachycardia) (HCC) 12/27/2012  . DEPRESSION 11/17/2009  . Anemia 02/03/2009  . 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 Anxiety state 01/21/2009  . Migraine without aura 01/21/2009  . Essential hypertension 01/21/2009    Past Surgical History:  Procedure Laterality Date  . ABDOMINAL HYSTERECTOMY  2005  . CESAREAN SECTION     x 2  . PLEURAL SCARIFICATION      OB History    Gravida  Para Term Preterm AB Living   2 2           SAB TAB Ectopic Multiple Live Births                   Home Medications    Prior to Admission medications   Medication Sig Start Date End Date Taking? Authorizing Provider  hydrochlorothiazide (HYDRODIURIL) 12.5 MG tablet Take 1 tablet (12.5 mg total) by mouth daily. 06/28/17   Sandford Craze'Sullivan, Melissa, NP  Multiple Vitamin (MULTIVITAMIN) tablet Take 1 tablet by mouth daily.    [provider]  pantoprazole (PROTONIX) 40 MG tablet Take 1 tablet (40 mg total) by mouth daily. 08/08/17   Sandford Craze'Sullivan, Melissa, NP    Family History Family History  Problem Relation Age of Onset  . Depression Mother   . Diabetes Mother   . Heart disease Mother     . Hypertension Mother   . Arthritis Mother   . Mental illness Mother   . Hypertension Father   . Coronary artery disease Father   . Heart disease Father   . Heart failure Brother   . Heart disease Brother   . Hypertension Brother        x2  . Hypertension Sister   . Stomach cancer Neg Hx     Social History Social History  Substance Use Topics  . Smoking status: Never Smoker  . Smokeless tobacco: Never Used  . Alcohol use No     Allergies   Patient has no known allergies.   Review of Systems Review of Systems  All other systems reviewed and are negative.    Physical Exam Updated Vital Signs BP (!) 147/99   Pulse 63   Temp 98.8 F (37.1 C) (Oral)   Resp 16   Ht 5\' 10"  (1.778 m)   Wt 74.8 kg (165 lb)   SpO2 100%   BMI 23.68 kg/m   Physical Exam  Constitutional: She is oriented to person, place, and time. She appears well-developed and well-nourished. No distress.  HENT:  Head: Normocephalic and atraumatic.  Mouth/Throat: Oropharynx is clear and moist.  Eyes: Pupils are equal, round, and reactive to light. Conjunctivae and EOM are normal.  Neck: Normal range of motion. Neck supple.  Cardiovascular: Normal rate, regular rhythm and intact distal pulses.   No murmur heard. Pulmonary/Chest: Effort normal and breath sounds normal. No respiratory distress. She has no wheezes. She has no rales. She exhibits no tenderness.  Abdominal: Soft. She exhibits no distension. There is no tenderness. There is no rebound and no guarding.  Musculoskeletal: Normal range of motion. She exhibits no edema or tenderness.  Neurological: She is alert and oriented to person, place, and time.  Skin: Skin is warm and dry. No rash noted. No erythema.  Psychiatric: She has a normal mood and affect. Her behavior is normal.  Nursing note and vitals reviewed.    ED Treatments / Results  Labs (all labs ordered are listed, but only abnormal results are displayed) Labs Reviewed  BASIC  METABOLIC PANEL - Abnormal; Notable for the following:       Result Value   Potassium 3.2 (*)    All other components within normal limits  CBC - Abnormal; Notable for the following:    RBC 5.42 (*)    MCV 71.4 (*)    MCH 24.9 (*)    RDW 15.8 (*)    All other components within normal limits  D-DIMER, QUANTITATIVE (NOT AT [facility]) -  Abnormal; Notable for the following:    D-Dimer, Quant 0.59 (*)    All other components within normal limits  TROPONIN I  TROPONIN I    EKG  EKG Interpretation  Date/Time:  Tuesday August 14 2017 09:39:25 EDT Ventricular Rate:  71 PR Interval:    QRS Duration: 117 QT Interval:  413 QTC Calculation: 449 R Axis:   -43 Text Interpretation:  Sinus rhythm Ventricular trigeminy Left atrial enlargement Incomplete RBBB and LAFB Left ventricular hypertrophy Anterior Q waves, possibly due to LVH No significant change since last tracing Confirmed by Gwyneth Sprout ([id number]) on 08/14/2017 9:45:01 AM       Radiology Dg Chest 2 View  Result Date: 08/14/2017 CLINICAL DATA:  Chest pain.  Pain since this morning. EXAM: CHEST  2 VIEW COMPARISON:  CT chest 01/19/2015 FINDINGS: There is biapical scarring. There is no focal parenchymal opacity. There is no pleural effusion or pneumothorax. There is stable cardiomegaly. There is an S-shaped curvature of the thoracolumbar spine. IMPRESSION: No active cardiopulmonary disease. Electronically Signed   By: Elige Ko   On: 08/14/2017 10:03   Ct Angio Chest Pe W And/or Wo Contrast  Result Date: 08/14/2017 CLINICAL DATA:  Chest pain and shortness of breath with slightly elevated D-dimer. EXAM: CT ANGIOGRAPHY CHEST WITH CONTRAST TECHNIQUE: Multidetector CT imaging of the chest was performed using the standard protocol during bolus administration of intravenous contrast. Multiplanar CT image reconstructions and MIPs were obtained to evaluate the vascular anatomy. CONTRAST:  100 cc Isovue 370 intravenous contrast. COMPARISON:  CT  chest dated January 19, 2015. FINDINGS: Cardiovascular: Satisfactory opacification of the pulmonary arteries to the segmental level. No evidence of pulmonary embolism. Mild enlargement of the main pulmonary artery. Cardiomegaly. No pericardial effusion. Mediastinum/Nodes: No enlarged mediastinal, hilar, or axillary lymph nodes. Thyroid gland, trachea, and esophagus demonstrate no significant findings. Lungs/Pleura: Unchanged biapical pleuroparenchymal scarring. Unchanged surgical clips in the right upper lobe. Unchanged right medial lung base pleural thickening/atelectasis. No pleural effusion or pneumothorax. No suspicious pulmonary nodule. Upper Abdomen: No acute abnormality. Musculoskeletal: No chest wall abnormality. No acute or significant osseous findings. Unchanged bone island in the left anterior sixth rib. Review of the MIP images confirms the above findings. IMPRESSION: 1. No evidence of pulmonary embolism. No acute intrathoracic process. 2. Cardiomegaly, unchanged. Electronically Signed   By: Obie Dredge M.D.   On: 08/14/2017 12:53    Procedures Procedures (including critical care time)  Medications Ordered in ED Medications  gi cocktail (Maalox,Lidocaine,Donnatal) (30 mLs Oral Given 08/14/17 1036)     Initial Impression / Assessment and Plan / ED Course  I have reviewed the triage vital signs and the nursing notes.  Pertinent labs & imaging results that were available during my care of the patient were reviewed by me and considered in my medical decision making (see chart for details).     Patient presenting with atypical chest pain that started suddenly at 8:00 today.  She had mild associated shortness of breath that started after the pain but it does not seem to be exertional. She was recently diagnosed with GERD but never filled the prescription can she was feeling better. The pain does not radiate. She has no abdominal pain today or concern for cholecystitis, pancreatitis or  bowel issue. Pain could be a result of esophageal spasming GERD however prior to the start of the pain she had not eaten and had been feeling normal. Also possibility for PE even though she is low risk Wells criteria.  We'll check a d-dimer. EKG without acute findings other than LVH and occasional PVCs. This is unchanged from prior EKGs. Patient's initial troponin is 0 however within 2 hours of pain starting will need to recheck a troponin. Chest x-ray within normal limits without evidence of pneumothorax, space occupying lesion or acute abnormality. Patient's vital signs are reassuring. Patient given GI cocktail and will reevaluate. CBC without evidence of anemia and BMP without significant hypokalemia.  12:13 PM No improvement with gi cocktail and pt given toradol.  Dimer is positive.  CTA pending and 2nd trop pending  1:52 PM CT negative for PE or pneumothorax. No acute findings. Repeat troponin is within normal limits. Patient improved after Toradol. Nonspecific chest pain at this time but low suspicion for ACS. Will allow patient to go home and recommended follow-up with PCP if symptoms do not improve.  Final Clinical Impressions(s) / ED Diagnoses   Final diagnoses:  Atypical chest pain    New Prescriptions New Prescriptions   No medications on file     Gwyneth Sprout, MD 08/14/17 1353

## 2017-08-14 NOTE — ED Notes (Signed)
Pt verbalized understanding of discharge instructions and denies any further questions at this time.   

## 2017-08-14 NOTE — ED Notes (Signed)
Patient transported to X-ray 

## 2017-09-04 MED FILL — HYDROCHLOROTHIAZIDE 12.5 MG: 12.5 | 30 days supply | Qty: 30 | Fill #2

## 2017-10-10 MED FILL — HYDROCHLOROTHIAZIDE 12.5 MG: 12.5 | 30 days supply | Qty: 30 | Fill #3

## 2017-11-14 MED FILL — HYDROCHLOROTHIAZIDE 12.5 MG: 12.5 | 30 days supply | Qty: 30 | Fill #4

## 2017-12-14 MED FILL — HYDROCHLOROTHIAZIDE 12.5 MG: 12.5 | 30 days supply | Qty: 30 | Fill #5

## 2018-05-13 ENCOUNTER — Ambulatory Visit (INDEPENDENT_AMBULATORY_CARE_PROVIDER_SITE_OTHER): Payer: BLUE CROSS/BLUE SHIELD | Admitting: Family Medicine

## 2018-05-13 ENCOUNTER — Encounter: Payer: Self-pay | Admitting: Family Medicine

## 2018-05-13 VITALS — BP 160/100 | HR 78 | Temp 98.8°F | Ht 70.0 in | Wt 164.5 lb

## 2018-05-13 DIAGNOSIS — R030 Elevated blood-pressure reading, without diagnosis of hypertension: Secondary | ICD-10-CM

## 2018-05-13 DIAGNOSIS — J209 Acute bronchitis, unspecified: Secondary | ICD-10-CM | POA: Diagnosis not present

## 2018-05-13 DIAGNOSIS — H6121 Impacted cerumen, right ear: Secondary | ICD-10-CM

## 2018-05-13 MED ORDER — BENZONATATE 100 MG PO CAPS: 100.0000 mg | ORAL_CAPSULE | Freq: Three times a day (TID) | ORAL | 0 refills | Status: DC | PRN

## 2018-05-13 MED ORDER — AZITHROMYCIN 250 MG PO TABS: ORAL_TABLET | ORAL | 0 refills | Status: DC

## 2018-05-13 MED FILL — BENZONATATE 100 MG CAPSULE: 100 | 10 days supply | Qty: 30 | Fill #0

## 2018-05-13 NOTE — Progress Notes (Signed)
Pre visit review using our clinic review tool, if applicable. No additional management support is needed unless otherwise documented below in the visit note. 

## 2018-05-13 NOTE — Progress Notes (Signed)
Chief Complaint  Patient presents with  . Cough    congestion    Aram BeechamCynthia Konz here for URI complaints.  Duration: 1 week  Associated symptoms: sinus congestion, sinus pain, rhinorrhea, ear pain, shortness of breath and cough Denies: itchy watery eyes, ear drainage, sore throat, wheezing and fevers Treatment to date: Mucinex Sick contacts: Yes   Hx of cerumen impaction. She does use Qtips.  Hx of HTN, has weaned herself off of blood pressure meds.   ROS:  Const: Denies fevers HEENT: As noted in HPI Lungs: +cough  Past Medical History:  Diagnosis Date  . Anemia   . Anxiety   . Depression   . GERD (gastroesophageal reflux disease)   . H. pylori infection   . Hx of cardiovascular stress test    Myoview 6/12:  diaph atten, no ischemia, low risk  . Hx of echocardiogram    Echo 6/12:  EF 50-55%, mild diast dysfn, mild LAE, mild MR  . Hypertension   . Irregular heart rhythm    unclear what arrhythmia  . Migraine   . Pneumothorax    pneumonia  . Supraventricular tachycardia, paroxysmal (HCC)    a. visit to EF 03/2011;  b. Event monitor (@[facility] ) 6/12:  PVCs/PACs, 3 episodes of WCT (SVT with aberrancy)  . Torus palatinus    Family History  Problem Relation Age of Onset  . Depression Mother   . Diabetes Mother   . Heart disease Mother   . Hypertension Mother   . Arthritis Mother   . Mental illness Mother   . Hypertension Father   . Coronary artery disease Father   . Heart disease Father   . Heart failure Brother   . Heart disease Brother   . Hypertension Brother        x2  . Hypertension Sister   . Stomach cancer Neg Hx    Allergies as of 05/13/2018   No Known Allergies     Medication List        Accurate as of 05/13/18  3:32 PM. Always use your most recent med list.          azithromycin 250 MG tablet Commonly known as:  ZITHROMAX Take 2 tabs the first day and then 1 tab daily until you run out.   benzonatate 100 MG capsule Commonly known as:   TESSALON Take 1 capsule (100 mg total) by mouth 3 (three) times daily as needed.   hydrochlorothiazide 12.5 MG tablet Commonly known as:  HYDRODIURIL Take 1 tablet (12.5 mg total) by mouth daily.   multivitamin tablet Take 1 tablet by mouth daily.   pantoprazole 40 MG tablet Commonly known as:  PROTONIX Take 1 tablet (40 mg total) by mouth daily.      BP (!) 160/100 (BP Location: Left Arm, Patient Position: Sitting, Cuff Size: Normal)   Pulse 78   Temp 98.8 F (37.1 C) (Oral)   Ht 5\' 10"  (1.778 m)   Wt 164 lb 8 oz (74.6 kg)   SpO2 98%   BMI 23.60 kg/m  General: Awake, alert, appears stated age HEENT: AT, [place], ears patent b/l and TM's neg, nares patent w/o discharge, pharynx pink and without exudates, MMM Neck: No masses or asymmetry Heart: RRR Lungs: CTAB, no accessory muscle use Psych: Age appropriate judgment and insight, normal mood and affect  Procedure note: R cerumen lavage Verbal consent obtained Robin Ewing, CMA irrigated ear with warm water and Dulcolax solution Cerumen successfully removed Pt tolerated well No  immediate complications noted  Acute bronchitis, unspecified organism - Plan: azithromycin (ZITHROMAX) 250 MG tablet, benzonatate (TESSALON) 100 MG capsule  Impacted cerumen of right ear  Elevated blood pressure reading  Zpak given worsening, only to be used in next few days if no improvement.  Continue to push fluids, practice good hand hygiene, cover mouth when coughing. Home management for cerumen rec'd. Ck BP at home. Bring bp readings to next appt. Letter for work given. F/u in 1 mo to ck bp. Pt voiced understanding and agreement to the plan.  Jilda Roche [place], DO 05/13/18 3:30 PM

## 2018-05-13 NOTE — Patient Instructions (Addendum)
OK to use Debrox (peroxide) in the ear to loosen up wax. Also recommend using a bulb syringe (for removing boogers from baby's noses) to flush through warm water. Do not use Q-tips as this can impact wax further.  I think you have bronchitis. Most causes of bronchitis are viral in nature. Because of this I want you to wait 2-3 days before using antibiotic (azithromycin).  Continue to push fluids, practice good hand hygiene, and cover your mouth if you cough.  If you start having fevers, shaking or shortness of breath, seek immediate care.  Check your blood pressures at home. I want you to follow up with Melissa in 1 month to discuss your home readings and see what your blood pressure is then. Cancel appointment if you are getting more normal readings.  Ibuprofen 400-600 mg (2-3 over the counter strength tabs) every 6 hours as needed for pain.  OK to take Tylenol 1000 mg (2 extra strength tabs) or 975 mg (3 regular strength tabs) every 6 hours as needed.  Let us know if you need anything.

## 2018-05-14 ENCOUNTER — Other Ambulatory Visit: Payer: Self-pay | Admitting: Family

## 2018-05-14 ENCOUNTER — Telehealth: Payer: Self-pay

## 2018-05-14 NOTE — Telephone Encounter (Signed)
[facility] go [facility] HCTZ, as pt. recently saw Dr.Wendling and BP was elevated. Pt. states she does not really want to go back on her medication, but notices that she has been getting dizzy with high BP readings. Pt. stated she was 140/104 this AM when she felt dizzy. Later in the day, however, she was in the 120s/60s, without any BP meds. Pt. is currently taking azithromycin for acute bronchitis. Pt has appointment with Sandford CrazeMelissa O'Sullivan on 7/10 to follow up on BP. Routed to Sandford CrazeMelissa O'Sullivan, NP, to advise.

## 2018-05-14 NOTE — Telephone Encounter (Signed)
Author phoned pt. to evaluate home BP readings, and discuss pt. request to go back on her HCTZ. No answer, so author left VM asking for call back [phone]. Per Dr. Hollie BeachWendling's note on 6/10, Dr. Javier DockerWanted pt to monitor home Bps and return in one month to discuss readings with PCP.No changes in BP meds made. Appointment with Sandford CrazeMelissa O'Sullivan already made for 7/11. OK for [facility] to discuss and triage.

## 2018-05-14 NOTE — Telephone Encounter (Signed)
Pt states she is wanting refill on this medication. She had stopped for a while but wanting to restart due to yesterday OV BP being high 160/100 (with Dr. Carmelia RollerWendling). Please advise.  [facility] [place], [place] - [id number] [phone] (Phone) [phone] (Fax)

## 2018-05-15 MED ORDER — AMLODIPINE BESYLATE 2.5 MG PO TABS
2.5000 mg | ORAL_TABLET | Freq: Every day | ORAL | 3 refills | Status: DC
Start: 2018-05-15 — End: 2018-09-24

## 2018-05-15 MED FILL — AMLODIPINE 2.5 MG TABLET: 2.5 | 30 days supply | Qty: 30 | Fill #0

## 2018-05-15 NOTE — Telephone Encounter (Signed)
rx sent for amlodipine, see order encounter.

## 2018-05-15 NOTE — Telephone Encounter (Signed)
Recommend that she start low dose amlodipine 2.5mg  once daily (not hctz since it has lowered her potassium in the past). Follow up with me as scheduled in July.

## 2018-05-15 NOTE — Telephone Encounter (Signed)
Noted and agree. 

## 2018-05-15 NOTE — Telephone Encounter (Signed)
See other encounter. Pt has already been notified.

## 2018-05-15 NOTE — Telephone Encounter (Signed)
Notified pt and she voices understanding but states that she doesn't remember HCTZ causing her potassium to be low, she just remembers taking herself off the medication. Pt is agreeable to try amlodipine. Advised pt to let us know if her readings continue to be 100 or greater diastolic and if any readings less than 110/60 after starting medication and otherwise keep f/u on 06/12/18.

## 2018-06-12 ENCOUNTER — Ambulatory Visit: Payer: BLUE CROSS/BLUE SHIELD | Admitting: Family

## 2018-06-17 MED FILL — AMLODIPINE 2.5 MG TABLET: 2.5 | 30 days supply | Qty: 30 | Fill #1

## 2018-07-02 ENCOUNTER — Ambulatory Visit: Payer: BLUE CROSS/BLUE SHIELD | Admitting: Family

## 2018-07-02 DIAGNOSIS — Z0289 Encounter for other administrative examinations: Secondary | ICD-10-CM

## 2018-07-17 ENCOUNTER — Encounter: Payer: Self-pay | Admitting: Family

## 2018-07-22 MED FILL — AMLODIPINE 2.5 MG TABLET: 2.5 | 30 days supply | Qty: 30 | Fill #2

## 2018-08-08 ENCOUNTER — Other Ambulatory Visit: Payer: Self-pay | Admitting: Family

## 2018-08-08 DIAGNOSIS — Z1231 Encounter for screening mammogram for malignant neoplasm of breast: Secondary | ICD-10-CM

## 2018-08-14 ENCOUNTER — Ambulatory Visit (HOSPITAL_BASED_OUTPATIENT_CLINIC_OR_DEPARTMENT_OTHER)
Admission: RE | Admit: 2018-08-14 | Discharge: 2018-08-14 | Disposition: A | Discharge status: Home / Self Care | Payer: BLUE CROSS/BLUE SHIELD | Source: Ambulatory Visit | Attending: Family | Admitting: Family

## 2018-08-14 DIAGNOSIS — Z1231 Encounter for screening mammogram for malignant neoplasm of breast: Secondary | ICD-10-CM

## 2018-08-19 MED FILL — AMLODIPINE 2.5 MG TABLET: 2.5 | 30 days supply | Qty: 30 | Fill #3

## 2018-09-24 ENCOUNTER — Other Ambulatory Visit: Payer: Self-pay | Admitting: Family

## 2018-09-24 NOTE — Telephone Encounter (Signed)
Amlodipine refilled for 30 days. Pt was due for follow up on 06/12/18 but cancelled and then "no showed" appt on 07/02/18. Letter mailed to pt to schedule OV for further refills.

## 2018-09-25 DIAGNOSIS — R300 Dysuria: Secondary | ICD-10-CM | POA: Diagnosis not present

## 2018-09-25 DIAGNOSIS — N760 Acute vaginitis: Secondary | ICD-10-CM | POA: Diagnosis not present

## 2018-09-25 DIAGNOSIS — R319 Hematuria, unspecified: Secondary | ICD-10-CM | POA: Diagnosis not present

## 2018-09-25 DIAGNOSIS — Z113 Encounter for screening for infections with a predominantly sexual mode of transmission: Secondary | ICD-10-CM | POA: Diagnosis not present

## 2018-09-25 DIAGNOSIS — R82998 Other abnormal findings in urine: Secondary | ICD-10-CM | POA: Diagnosis not present

## 2018-09-30 MED FILL — levoFLOXacin 750 MG TABS: 750 | 7 days supply | Qty: 7 | Fill #0

## 2018-10-01 MED FILL — AMLODIPINE 2.5 MG TABLET: 2.5 | 30 days supply | Qty: 30 | Fill #0

## 2018-10-28 ENCOUNTER — Ambulatory Visit (INDEPENDENT_AMBULATORY_CARE_PROVIDER_SITE_OTHER): Payer: BLUE CROSS/BLUE SHIELD

## 2018-10-28 DIAGNOSIS — Z23 Encounter for immunization: Secondary | ICD-10-CM | POA: Diagnosis not present

## 2018-10-28 NOTE — Progress Notes (Signed)
Patient came in today to have her flu shot done she tolerated the vaccine in her right deltoid IM well with no complications

## 2018-11-12 ENCOUNTER — Other Ambulatory Visit: Payer: Self-pay | Admitting: Family

## 2018-11-14 MED FILL — AMLODIPINE 2.5 MG TABLET: 2.5 | 14 days supply | Qty: 14 | Fill #0

## 2018-12-02 ENCOUNTER — Other Ambulatory Visit: Payer: Self-pay

## 2018-12-02 MED ORDER — AMLODIPINE BESYLATE 2.5 MG PO TABS: 2.5000 mg | ORAL_TABLET | Freq: Every day | ORAL | 0 refills | Status: DC

## 2018-12-02 MED FILL — AMLODIPINE 2.5 MG TABLET: 2.5 | 30 days supply | Qty: 30 | Fill #0

## 2018-12-07 DIAGNOSIS — J101 Influenza due to other identified influenza virus with other respiratory manifestations: Secondary | ICD-10-CM | POA: Diagnosis not present

## 2018-12-09 ENCOUNTER — Encounter: Payer: Self-pay | Admitting: Family

## 2018-12-09 ENCOUNTER — Ambulatory Visit (INDEPENDENT_AMBULATORY_CARE_PROVIDER_SITE_OTHER): Payer: BLUE CROSS/BLUE SHIELD | Admitting: Family

## 2018-12-09 VITALS — BP 151/79 | HR 70 | Temp 98.9°F | Resp 16 | Ht 70.0 in | Wt 161.0 lb

## 2018-12-09 DIAGNOSIS — I10 Essential (primary) hypertension: Secondary | ICD-10-CM | POA: Diagnosis not present

## 2018-12-09 DIAGNOSIS — J101 Influenza due to other identified influenza virus with other respiratory manifestations: Secondary | ICD-10-CM

## 2018-12-09 MED ORDER — AMLODIPINE BESYLATE 5 MG PO TABS
5.0000 mg | ORAL_TABLET | Freq: Every day | ORAL | 3 refills | Status: DC
Start: 2018-12-09 — End: 2019-04-21

## 2018-12-09 MED FILL — AMLODIPINE BESYLATE 5 MG TA: 5 | 30 days supply | Qty: 30 | Fill #0

## 2018-12-09 NOTE — Progress Notes (Signed)
Subjective:    Patient ID: [name], female    DOB: 29-Aug-1961, 58 y.o.   MRN: [id number]  HPI  Patient is a 58 yr old female who presents today for follow up.  HTN- maintained on amlodipine 2.5 mg once daily.  Reports that her last dose was yesterday around this time.   BP Readings from Last 3 Encounters:  12/09/18 (!) 151/79  05/13/18 (!) 160/100  08/14/17 (!) 126/98   Flu- pt was diagnosed with flu B on 12/07/17 at urgent care. Was not treated with tamiflu. Reports that her symptoms began on New Year's day.  She did have a flu shot.  She reports that symptoms are slowly improvement.  Just has fatigue.  She did have a flu shot.   Review of Systems See  HPI  Past Medical History:  Diagnosis Date  . Anemia   . Anxiety   . Depression   . GERD (gastroesophageal reflux disease)   . H. pylori infection   . Hx of cardiovascular stress test    Myoview 6/12:  diaph atten, no ischemia, low risk  . Hx of echocardiogram    Echo 6/12:  EF 50-55%, mild diast dysfn, mild LAE, mild MR  . Hypertension   . Irregular heart rhythm    unclear what arrhythmia  . Migraine   . Pneumothorax    pneumonia  . Supraventricular tachycardia, paroxysmal (HCC)    a. visit to EF 03/2011;  b. Event monitor (@[facility] ) 6/12:  PVCs/PACs, 3 episodes of WCT (SVT with aberrancy)  . Torus palatinus      Social History   Socioeconomic History  . Marital status: Legally Separated    Spouse name: Not on file  . Number of children: 2  . Years of education: Not on file  . Highest education level: Not on file  Occupational History  . Occupation: Conservator, museum/gallery: [employer]  Social Needs  . Financial resource strain: Not on file  . Food insecurity:    Worry: Not on file    Inability: Not on file  . Transportation needs:    Medical: Not on file    Non-medical: Not on file  Tobacco Use  . Smoking status: Never Smoker  . Smokeless tobacco: Never Used  Substance  and Sexual Activity  . Alcohol use: No  . Drug use: No  . Sexual activity: Yes  Lifestyle  . Physical activity:    Days per week: Not on file    Minutes per session: Not on file  . Stress: Not on file  Relationships  . Social connections:    Talks on phone: Not on file    Gets together: Not on file    Attends religious service: Not on file    Active member of club or organization: Not on file    Attends meetings of clubs or organizations: Not on file    Relationship status: Not on file  . Intimate partner violence:    Fear of current or ex partner: Not on file    Emotionally abused: Not on file    Physically abused: Not on file    Forced sexual activity: Not on file  Other Topics Concern  . Not on file  Social History Narrative  . Not on file    Past Surgical History:  Procedure Laterality Date  . ABDOMINAL HYSTERECTOMY  2005  . CESAREAN SECTION     x 2  . PLEURAL SCARIFICATION  Family History  Problem Relation Age of Onset  . Depression Mother   . Diabetes Mother   . Heart disease Mother   . Hypertension Mother   . Arthritis Mother   . Mental illness Mother   . Hypertension Father   . Coronary artery disease Father   . Heart disease Father   . Heart failure Brother   . Heart disease Brother   . Hypertension Brother        x2  . Hypertension Sister   . Stomach cancer Neg Hx     No Known Allergies  Current Outpatient Medications on File Prior to Visit  Medication Sig Dispense Refill  . amLODipine (NORVASC) 2.5 MG tablet Take 1 tablet (2.5 mg total) by mouth daily. 30 tablet 0   No current facility-administered medications on file prior to visit.     BP (!) 151/79 (BP Location: Right Arm, Patient Position: Sitting, Cuff Size: Small)   Pulse 70   Temp 98.9 F (37.2 C) (Oral)   Resp 16   Ht 5\' 10"  (1.778 m)   Wt 161 lb (73 kg)   SpO2 100%   BMI 23.10 kg/m       Objective:   Physical Exam Constitutional:      Appearance: She is  well-developed.  HENT:     Right Ear: Tympanic membrane and ear canal normal.     Left Ear: Tympanic membrane and ear canal normal.  Neck:     Musculoskeletal: Neck supple.     Thyroid: No thyromegaly.  Cardiovascular:     Rate and Rhythm: Normal rate and regular rhythm.     Heart sounds: Normal heart sounds. No murmur.  Pulmonary:     Effort: Pulmonary effort is normal. No respiratory distress.     Breath sounds: Normal breath sounds. No wheezing.  Skin:    General: Skin is warm and dry.  Neurological:     Mental Status: She is alert and oriented to person, place, and time.  Psychiatric:        Behavior: Behavior normal.        Thought Content: Thought content normal.        Judgment: Judgment normal.           Assessment & Plan:  HTN- uncontrolled. Increase amlodipine from 2.5 to 5mg . Follow up in 1 month.  Influenza B- discussed supportive measures. Pt is advised to call if symptoms worsen or if they do not continue to improve.

## 2018-12-09 NOTE — Patient Instructions (Signed)
Please increase amlodipine from 2.5mg  to 5mg . Call if flu symptoms worsen or if not improved in 3-4 days.

## 2018-12-11 ENCOUNTER — Emergency Department (HOSPITAL_BASED_OUTPATIENT_CLINIC_OR_DEPARTMENT_OTHER)
Admission: EM | Admit: 2018-12-11 | Discharge: 2018-12-11 | Disposition: A | Discharge status: Home / Self Care | Payer: BLUE CROSS/BLUE SHIELD | Attending: Emergency Medicine | Admitting: Emergency Medicine

## 2018-12-11 ENCOUNTER — Other Ambulatory Visit: Payer: Self-pay

## 2018-12-11 ENCOUNTER — Encounter (HOSPITAL_BASED_OUTPATIENT_CLINIC_OR_DEPARTMENT_OTHER): Payer: Self-pay

## 2018-12-11 ENCOUNTER — Emergency Department (HOSPITAL_BASED_OUTPATIENT_CLINIC_OR_DEPARTMENT_OTHER): Payer: BLUE CROSS/BLUE SHIELD

## 2018-12-11 ENCOUNTER — Ambulatory Visit: Payer: Self-pay | Admitting: *Deleted

## 2018-12-11 DIAGNOSIS — R0602 Shortness of breath: Secondary | ICD-10-CM | POA: Insufficient documentation

## 2018-12-11 DIAGNOSIS — Z79899 Other long term (current) drug therapy: Secondary | ICD-10-CM | POA: Diagnosis not present

## 2018-12-11 DIAGNOSIS — K219 Gastro-esophageal reflux disease without esophagitis: Secondary | ICD-10-CM | POA: Insufficient documentation

## 2018-12-11 DIAGNOSIS — I10 Essential (primary) hypertension: Secondary | ICD-10-CM | POA: Diagnosis not present

## 2018-12-11 DIAGNOSIS — R079 Chest pain, unspecified: Secondary | ICD-10-CM | POA: Diagnosis not present

## 2018-12-11 LAB — COMPREHENSIVE METABOLIC PANEL
ALBUMIN: 4.6 g/dL (ref 3.5–5.0)
ALK PHOS: 69 U/L (ref 38–126)
ALT: 20 U/L (ref 0–44)
AST: 21 U/L (ref 15–41)
Anion gap: 8 (ref 5–15)
BUN: 14 mg/dL (ref 6–20)
CALCIUM: 9.3 mg/dL (ref 8.9–10.3)
CHLORIDE: 104 mmol/L (ref 98–111)
CO2: 27 mmol/L (ref 22–32)
CREATININE: 0.63 mg/dL (ref 0.44–1.00)
GFR calc non Af Amer: >60 mL/min (ref >60)
GLUCOSE: 92 mg/dL (ref 70–99)
Potassium: 3.6 mmol/L (ref 3.5–5.1)
SODIUM: 139 mmol/L (ref 135–145)
Total Bilirubin: 0.7 mg/dL (ref 0.3–1.2)
Total Protein: 7.6 g/dL (ref 6.5–8.1)

## 2018-12-11 LAB — CBC WITH DIFFERENTIAL/PLATELET
Abs Immature Granulocytes: 0.02 10*3/uL (ref 0.00–0.07)
BASOS ABS: 0 10*3/uL (ref 0.0–0.1)
BASOS PCT: 0 %
EOS PCT: 2 %
Eosinophils Absolute: 0.1 10*3/uL (ref 0.0–0.5)
HCT: 40.4 % (ref 36.0–46.0)
HEMOGLOBIN: 13 g/dL (ref 12.0–15.0)
Immature Granulocytes: 0 %
LYMPHS PCT: 34 %
Lymphs Abs: 2 10*3/uL (ref 0.7–4.0)
MCH: 24.2 pg — AB (ref 26.0–34.0)
MCHC: 32.2 g/dL (ref 30.0–36.0)
MCV: 75.2 fL — ABNORMAL LOW (ref 80.0–100.0)
MONO ABS: 0.5 10*3/uL (ref 0.1–1.0)
Monocytes Relative: 9 %
NEUTROS ABS: 3.3 10*3/uL (ref 1.7–7.7)
NRBC: 0 % (ref 0.0–0.2)
Neutrophils Relative %: 55 %
PLATELETS: 273 10*3/uL (ref 150–400)
RBC: 5.37 MIL/uL — AB (ref 3.87–5.11)
RDW: 15.2 % (ref 11.5–15.5)
WBC: 5.9 10*3/uL (ref 4.0–10.5)

## 2018-12-11 LAB — URINALYSIS, MICROSCOPIC (REFLEX): WBC UA: NONE SEEN WBC/hpf (ref 0–5)

## 2018-12-11 LAB — URINALYSIS, ROUTINE W REFLEX MICROSCOPIC
Bilirubin Urine: NEGATIVE
Glucose, UA: NEGATIVE mg/dL
Ketones, ur: NEGATIVE mg/dL
Leukocytes, UA: NEGATIVE
Nitrite: NEGATIVE
PH: 6.5 (ref 5.0–8.0)
Protein, ur: NEGATIVE mg/dL

## 2018-12-11 LAB — TROPONIN I: Troponin I: <0.03 ng/mL (ref <0.03)

## 2018-12-11 MED ORDER — PANTOPRAZOLE SODIUM 40 MG PO TBEC
40.0000 mg | DELAYED_RELEASE_TABLET | Freq: Once | ORAL | Status: AC
  Administered 2018-12-11: 40 mg via ORAL
  Filled 2018-12-11: qty 1

## 2018-12-11 MED ORDER — PANTOPRAZOLE SODIUM 40 MG PO TBEC: 40.0000 mg | DELAYED_RELEASE_TABLET | Freq: Every day | ORAL | 0 refills | Status: DC

## 2018-12-11 MED ORDER — ALUM & MAG HYDROXIDE-SIMETH 200-200-20 MG/5ML PO SUSP
30.0000 mL | Freq: Once | ORAL | Status: AC
  Administered 2018-12-11: 30 mL via ORAL
  Filled 2018-12-11: qty 30

## 2018-12-11 MED ORDER — LIDOCAINE VISCOUS HCL 2 % MT SOLN
15.0000 mL | Freq: Once | OROMUCOSAL | Status: AC
  Administered 2018-12-11: 15 mL via ORAL
  Filled 2018-12-11: qty 15

## 2018-12-11 NOTE — ED Triage Notes (Addendum)
C/o intermittent CP x 1 week-"heartburn"-worse today-pt was dx with the flu 1/4 -NAD-steady gait

## 2018-12-11 NOTE — Telephone Encounter (Signed)
Patient has strong history of reflux- she states it woke her up last night and she has been treating it all day- it is not going away.Medication tried- Zantac, Pepto, Rolaids- all not helping. Patient advised that she does needs to get this checked- within the 4 hour window if possible- she is concerned enough that she is willing to go to ED. Patient agrees to go to ED now.  Reason for Disposition . [1] MILD-MODERATE pain AND [2] constant AND [3] present > 2 hours  Answer Assessment - Initial Assessment Questions 1. LOCATION: "Where does it hurt?"      Center of chest 2. RADIATION: "Does the pain shoot anywhere else?" (e.g., chest, back)     no 3. ONSET: "When did the pain begin?" (e.g., minutes, hours or days ago)      4;00 this morning the acid reflux woke her- 1 hour ago it can back 4. SUDDEN: "Gradual or sudden onset?"     gradual 5. PATTERN "Does the pain come and go, or is it constant?"    - If constant: "Is it getting better, staying the same, or worsening?"      (Note: Constant means the pain never goes away completely; most serious pain is constant and it progresses)     - If intermittent: "How long does it last?" "Do you have pain now?"     (Note: Intermittent means the pain goes away completely between bouts)     Comes and goes 6. SEVERITY: "How bad is the pain?"  (e.g., Scale 1-10; mild, moderate, or severe)    - MILD (1-3): doesn't interfere with normal activities, abdomen soft and not tender to touch     - MODERATE (4-7): interferes with normal activities or awakens from sleep, tender to touch     - SEVERE (8-10): excruciating pain, doubled over, unable to do any normal activities       moderate 7. RECURRENT SYMPTOM: "Have you ever had this type of abdominal pain before?" If so, ask: "When was the last time?" and "What happened that time?"      Reflux before- it has been a long time 8. AGGRAVATING FACTORS: "Does anything seem to cause this pain?" (e.g., foods, stress,  alcohol)     No- salad with blueberries 9. CARDIAC SYMPTOMS: "Do you have any of the following symptoms: chest pain, difficulty breathing, sweating, nausea?"     Some SOB- patient is getting over the flu 10. OTHER SYMPTOMS: "Do you have any other symptoms?" (e.g., fever, vomiting, diarrhea)       no 11. PREGNANCY: "Is there any chance you are pregnant?" "When was your last menstrual period?"       n/a  Protocols used: ABDOMINAL PAIN - UPPER-A-AH

## 2018-12-11 NOTE — ED Provider Notes (Signed)
[facility] EMERGENCY DEPARTMENT Provider Note   CSN: [id number] Arrival date & time: 12/11/18  1536     History   Chief Complaint Chief Complaint  Patient presents with  . Chest Pain    HPI [name] is a 58 y.o. female.  The history is provided by the patient. No language interpreter was used.  Chest Pain   [name] is a 58 y.o. female who presents to the Emergency Department complaining of CP. She presents to the emergency department complaining of chest pain that she describes as reflux. She has experienced one week of indigestion type symptoms with burning in her chest. Pain is worse at night and with laying flat, improved with sitting up. Today at 4 o'clock she developed severe central chest burning. She took one of her sons Zantac as well as Tums with no significant improvement in her symptoms. She also had ongoing pain throughout the day that was slightly worse with activity. She also has associated shortness of breath. Over the last several weeks she has been feeling very fatigued. Five days ago she went to urgent care and was diagnosed with the flu. She has a mild, dry cough. She denies any fevers, vomiting, abdominal pain, leg swelling or pain. She does have mild diarrhea. She has a history of SVT and hypertension, no additional medical problems. She does not smoke, drink alcohol or use drugs. Past Medical History:  Diagnosis Date  . Anemia   . Anxiety   . Depression   . GERD (gastroesophageal reflux disease)   . H. pylori infection   . Hx of cardiovascular stress test    Myoview 6/12:  diaph atten, no ischemia, low risk  . Hx of echocardiogram    Echo 6/12:  EF 50-55%, mild diast dysfn, mild LAE, mild MR  . Hypertension   . Irregular heart rhythm    unclear what arrhythmia  . Migraine   . Pneumothorax    pneumonia  . Supraventricular tachycardia, paroxysmal (HCC)    a. visit to EF 03/2011;  b. Event monitor (@[facility] ) 6/12:  PVCs/PACs, 3 episodes  of WCT (SVT with aberrancy)  . Torus palatinus     Patient Active Problem List   Diagnosis Date Noted  . Needs flu shot 10/28/2018  . Fatigue 01/19/2015  . Compression fracture of body of thoracic vertebra (HCC) 01/19/2015  . Abnormal CT scan 12/17/2014  . Reactive airway disease that is not asthma 11/18/2014  . Pulmonary nodules 11/18/2014  . History of pneumothorax 11/10/2014  . Gastroesophageal reflux disease without esophagitis 07/27/2014  . SVT (supraventricular tachycardia) (HCC) 12/27/2012  . DEPRESSION 11/17/2009  . Anemia 02/03/2009  . 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 Anxiety state 01/21/2009  . Migraine without aura 01/21/2009  . Essential hypertension 01/21/2009    Past Surgical History:  Procedure Laterality Date  . ABDOMINAL HYSTERECTOMY  2005  . CESAREAN SECTION     x 2  . PLEURAL SCARIFICATION       OB History    Gravida  2   Para  2   Term      Preterm      AB      Living        SAB      TAB      Ectopic      Multiple      Live Births               Home Medications    Prior  to Admission medications   Medication Sig Start Date End Date Taking? Authorizing Provider  amLODipine (NORVASC) 5 MG tablet Take 1 tablet (5 mg total) by mouth daily. 12/09/18  Yes Sandford Craze, NP  pantoprazole (PROTONIX) 40 MG tablet Take 1 tablet (40 mg total) by mouth daily. 12/11/18   Tilden Fossa, MD    Family History Family History  Problem Relation Age of Onset  . Depression Mother   . Diabetes Mother   . Heart disease Mother   . Hypertension Mother   . Arthritis Mother   . Mental illness Mother   . Hypertension Father   . Coronary artery disease Father   . Heart disease Father   . Heart failure Brother   . Heart disease Brother   . Hypertension Brother        x2  . Hypertension Sister   . Stomach cancer Neg Hx     Social History Social History   Tobacco Use  . Smoking status: Never Smoker  . Smokeless tobacco: Never Used    Substance Use Topics  . Alcohol use: No  . Drug use: No     Allergies   Patient has no known allergies.   Review of Systems Review of Systems  Cardiovascular: Positive for chest pain.  All other systems reviewed and are negative.    Physical Exam Updated Vital Signs BP 119/79 (BP Location: Left Arm)   Pulse 71   Temp 98.2 F (36.8 C) (Oral)   Resp 16   Ht 5\' 10"  (1.778 m)   Wt 72.1 kg   SpO2 100%   BMI 22.81 kg/m   Physical Exam Vitals signs and nursing note reviewed.  Constitutional:      Appearance: She is well-developed.  HENT:     Head: Normocephalic and atraumatic.  Cardiovascular:     Rate and Rhythm: Normal rate and regular rhythm.     Heart sounds: No murmur.  Pulmonary:     Effort: Pulmonary effort is normal. No respiratory distress.     Breath sounds: Normal breath sounds.  Abdominal:     Palpations: Abdomen is soft.     Tenderness: There is no abdominal tenderness. There is no guarding or rebound.  Musculoskeletal:        General: No swelling or tenderness.  Skin:    General: Skin is warm and dry.  Neurological:     Mental Status: She is alert and oriented to person, place, and time.  Psychiatric:        Mood and Affect: Mood normal.        Behavior: Behavior normal.      ED Treatments / Results  Labs (all labs ordered are listed, but only abnormal results are displayed) Labs Reviewed  CBC WITH DIFFERENTIAL/PLATELET - Abnormal; Notable for the following components:      Result Value   RBC 5.37 (*)    MCV 75.2 (*)    MCH 24.2 (*)    All other components within normal limits  URINALYSIS, ROUTINE W REFLEX MICROSCOPIC - Abnormal; Notable for the following components:   Color, Urine STRAW (*)    Specific Gravity, Urine <1.005 (*)    Hgb urine dipstick SMALL (*)    All other components within normal limits  URINALYSIS, MICROSCOPIC (REFLEX) - Abnormal; Notable for the following components:   Bacteria, UA RARE (*)    All other components  within normal limits  COMPREHENSIVE METABOLIC PANEL  TROPONIN I  TROPONIN I  EKG EKG Interpretation  Date/Time:  Wednesday December 11 2018 15:50:44 EST Ventricular Rate:  79 PR Interval:  162 QRS Duration: 110 QT Interval:  404 QTC Calculation: 463 R Axis:   -49 Text Interpretation:  Sinus rhythm ventricular trigeminy Possible Left atrial enlargement Incomplete right bundle branch block Left anterior fascicular block Left ventricular hypertrophy Cannot rule out Septal infarct , age undetermined Abnormal ECG No significant change since last tracing Confirmed by Tilden Fossaees, Liza Czerwinski [phone]([id number]) on 12/11/2018 3:56:34 PM   Radiology Dg Chest 2 View  Result Date: 12/11/2018 CLINICAL DATA:  Chest pain short of breath EXAM: CHEST - 2 VIEW COMPARISON:  CT 08/14/2017, chest two-view 08/14/2017 FINDINGS: Heart size mildly enlarged without evidence of heart failure. Lungs are clear without infiltrate effusion or mass. Apical scarring right greater than left appears unchanged. IMPRESSION: No active cardiopulmonary disease. Electronically Signed   By: Marlan Palauharles  Clark M.D.   On: 12/11/2018 17:11    Procedures Procedures (including critical care time)  Medications Ordered in ED Medications  pantoprazole (PROTONIX) EC tablet 40 mg (40 mg Oral Given 12/11/18 1620)  alum & mag hydroxide-simeth (MAALOX/MYLANTA) 200-200-20 MG/5ML suspension 30 mL (30 mLs Oral Given 12/11/18 1620)    And  lidocaine (XYLOCAINE) 2 % viscous mouth solution 15 mL (15 mLs Oral Given 12/11/18 1620)     Initial Impression / Assessment and Plan / ED Course  I have reviewed the triage vital signs and the nursing notes.  Pertinent labs & imaging results that were available during my care of the patient were reviewed by me and considered in my medical decision making (see chart for details).     Patient here for evaluation of one week of intermittent chest pain, now worsening. She is non-toxic appearing on evaluation with no respiratory  distress. EKG is and trigeminy, unchanged from prior. Troponin is negative times two. She is partially improved following treatment in the emergency department. Current presentation is not consistent with PE, ACS, dissection, pneumonia, CHF, acute abdomen. Gus with patient home care for reflux. Discussed outpatient follow-up and return precautions.  Final Clinical Impressions(s) / ED Diagnoses   Final diagnoses:  Nonspecific chest pain  Gastroesophageal reflux disease, esophagitis presence not specified    ED Discharge Orders         Ordered    pantoprazole (PROTONIX) 40 MG tablet  Daily     12/11/18 2012           Tilden Fossaees, Javan Gonzaga, MD 12/11/18 2016

## 2018-12-11 NOTE — ED Notes (Signed)
ED Provider at bedside. 

## 2018-12-12 MED FILL — PANTOPRAZOLE SOD DR 40 MG T: 40 | 30 days supply | Qty: 30 | Fill #0

## 2019-01-14 ENCOUNTER — Telehealth: Payer: Self-pay | Admitting: Family

## 2019-01-14 MED ORDER — PANTOPRAZOLE SODIUM 40 MG PO TBEC: 40.0000 mg | DELAYED_RELEASE_TABLET | Freq: Every day | ORAL | 2 refills | Status: DC

## 2019-01-14 MED FILL — PANTOPRAZOLE SOD DR 40 MG T: 40 | 30 days supply | Qty: 30 | Fill #0

## 2019-01-14 NOTE — Telephone Encounter (Signed)
Please call patient to schedule ed follow up visit, thanks.

## 2019-01-14 NOTE — Telephone Encounter (Signed)
Please contact pt to arrange ED follow up. 

## 2019-01-17 MED FILL — AMLODIPINE BESYLATE 5 MG TA: 5 | 30 days supply | Qty: 30 | Fill #1

## 2019-01-23 ENCOUNTER — Ambulatory Visit: Payer: BLUE CROSS/BLUE SHIELD | Admitting: Family

## 2019-01-23 VITALS — BP 133/87 | HR 67 | Temp 98.7°F | Resp 16 | Ht 70.0 in | Wt 156.8 lb

## 2019-01-23 DIAGNOSIS — K219 Gastro-esophageal reflux disease without esophagitis: Secondary | ICD-10-CM

## 2019-01-23 NOTE — Progress Notes (Signed)
Subjective:    Patient ID: [name], female    DOB: 07-17-1961, 58 y.o.   MRN: [id number]  HPI  Patient is a 58 yr old female who presents today for ER follow up. ER summary is reviewed from 12/11/18. She presented with c/o chest pain that she described as reflux.  She also was diagnosed with flu on 12/06/18. She had EKG which was felt to be stable and troponin was negative x 2.  She was started on protonix at her discharge. She reports that since that time she has had no further reflux symptoms.  She has also modified her diet to help improve her reflux symptoms.    Review of Systems See HPI  Past Medical History:  Diagnosis Date  . Anemia   . Anxiety   . Depression   . GERD (gastroesophageal reflux disease)   . H. pylori infection   . Hx of cardiovascular stress test    Myoview 6/12:  diaph atten, no ischemia, low risk  . Hx of echocardiogram    Echo 6/12:  EF 50-55%, mild diast dysfn, mild LAE, mild MR  . Hypertension   . Irregular heart rhythm    unclear what arrhythmia  . Migraine   . Pneumothorax    pneumonia  . Supraventricular tachycardia, paroxysmal (HCC)    a. visit to EF 03/2011;  b. Event monitor (@[facility] ) 6/12:  PVCs/PACs, 3 episodes of WCT (SVT with aberrancy)  . Torus palatinus      Social History   Socioeconomic History  . Marital status: Legally Separated    Spouse name: Not on file  . Number of children: 2  . Years of education: Not on file  . Highest education level: Not on file  Occupational History  . Occupation: Conservator, museum/gallery: [employer]  Social Needs  . Financial resource strain: Not on file  . Food insecurity:    Worry: Not on file    Inability: Not on file  . Transportation needs:    Medical: Not on file    Non-medical: Not on file  Tobacco Use  . Smoking status: Never Smoker  . Smokeless tobacco: Never Used  Substance and Sexual Activity  . Alcohol use: No  . Drug use: No  . Sexual  activity: Not on file  Lifestyle  . Physical activity:    Days per week: Not on file    Minutes per session: Not on file  . Stress: Not on file  Relationships  . Social connections:    Talks on phone: Not on file    Gets together: Not on file    Attends religious service: Not on file    Active member of club or organization: Not on file    Attends meetings of clubs or organizations: Not on file    Relationship status: Not on file  . Intimate partner violence:    Fear of current or ex partner: Not on file    Emotionally abused: Not on file    Physically abused: Not on file    Forced sexual activity: Not on file  Other Topics Concern  . Not on file  Social History Narrative  . Not on file    Past Surgical History:  Procedure Laterality Date  . ABDOMINAL HYSTERECTOMY  2005  . CESAREAN SECTION     x 2  . PLEURAL SCARIFICATION      Family History  Problem Relation Age of Onset  .  Depression Mother   . Diabetes Mother   . Heart disease Mother   . Hypertension Mother   . Arthritis Mother   . Mental illness Mother   . Hypertension Father   . Coronary artery disease Father   . Heart disease Father   . Heart failure Brother   . Heart disease Brother   . Hypertension Brother        x2  . Hypertension Sister   . Stomach cancer Neg Hx     No Known Allergies  Current Outpatient Medications on File Prior to Visit  Medication Sig Dispense Refill  . amLODipine (NORVASC) 5 MG tablet Take 1 tablet (5 mg total) by mouth daily. 30 tablet 3  . pantoprazole (PROTONIX) 40 MG tablet Take 1 tablet (40 mg total) by mouth daily. 30 tablet 2   No current facility-administered medications on file prior to visit.     BP 133/87 (BP Location: Left Arm, Patient Position: Sitting, Cuff Size: Small)   Pulse 67   Temp 98.7 F (37.1 C) (Oral)   Resp 16   Ht 5\' 10"  (1.778 m)   Wt 156 lb 12.8 oz (71.1 kg)   SpO2 100%   BMI 22.50 kg/m       Objective:   Physical  Exam Constitutional:      Appearance: She is well-developed.  Neck:     Musculoskeletal: Neck supple.     Thyroid: No thyromegaly.  Cardiovascular:     Rate and Rhythm: Normal rate and regular rhythm.     Heart sounds: Normal heart sounds. No murmur.  Pulmonary:     Effort: Pulmonary effort is normal. No respiratory distress.     Breath sounds: Normal breath sounds. No wheezing.  Skin:    General: Skin is warm and dry.  Neurological:     Mental Status: She is alert and oriented to person, place, and time.  Psychiatric:        Behavior: Behavior normal.        Thought Content: Thought content normal.        Judgment: Judgment normal.           Assessment & Plan:  GERD- improved on protonix. Continue same. Plan follow up in 3 months and will consider transitioning to pepcid bid at that time.

## 2019-01-23 NOTE — Patient Instructions (Signed)
Continue protonix and reflux diet precautions.

## 2019-02-19 MED FILL — AMLODIPINE BESYLATE 5 MG TA: 5 | 30 days supply | Qty: 30 | Fill #2

## 2019-03-22 MED FILL — AMLODIPINE BESYLATE 5 MG TA: 5 | 30 days supply | Qty: 30 | Fill #3

## 2019-04-21 ENCOUNTER — Ambulatory Visit (INDEPENDENT_AMBULATORY_CARE_PROVIDER_SITE_OTHER): Payer: BLUE CROSS/BLUE SHIELD | Admitting: Family

## 2019-04-21 ENCOUNTER — Other Ambulatory Visit: Payer: Self-pay

## 2019-04-21 DIAGNOSIS — F32A Depression, unspecified: Secondary | ICD-10-CM

## 2019-04-21 DIAGNOSIS — I10 Essential (primary) hypertension: Secondary | ICD-10-CM | POA: Diagnosis not present

## 2019-04-21 DIAGNOSIS — M8580 Other specified disorders of bone density and structure, unspecified site: Secondary | ICD-10-CM

## 2019-04-21 DIAGNOSIS — F411 Generalized anxiety disorder: Secondary | ICD-10-CM | POA: Diagnosis not present

## 2019-04-21 DIAGNOSIS — F329 Major depressive disorder, single episode, unspecified: Secondary | ICD-10-CM | POA: Diagnosis not present

## 2019-04-21 DIAGNOSIS — K219 Gastro-esophageal reflux disease without esophagitis: Secondary | ICD-10-CM

## 2019-04-21 MED ORDER — AMLODIPINE BESYLATE 5 MG PO TABS: 2.5000 mg | ORAL_TABLET | Freq: Every day | ORAL | 3 refills | Status: DC

## 2019-04-21 MED ORDER — AMLODIPINE BESYLATE 2.5 MG PO TABS: 2.5000 mg | ORAL_TABLET | Freq: Every day | ORAL | 1 refills | Status: DC

## 2019-04-21 MED FILL — AMLODIPINE 2.5 MG TABLET: 2.5 | 90 days supply | Qty: 90 | Fill #0

## 2019-04-21 NOTE — Progress Notes (Signed)
Virtual Visit via Video Note  I connected with [name] on 04/21/19 at  7:40 AM EDT by a video enabled telemedicine application and verified that I am speaking with the correct person using two identifiers. This visit type was conducted due to national recommendations for restrictions regarding the COVID-19 Pandemic (e.g. social distancing).  This format is felt to be most appropriate for this patient at this time.   I discussed the limitations of evaluation and management by telemedicine and the availability of in person appointments. The patient expressed understanding and agreed to proceed.  Only the patient and myself were on today's video visit. The patient was at home and I was in my office at the time of today's visit.   History of Present Illness:  Patient is a 58 yr old female who presents today for routine follow up.  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 Went back to work last week. Reports that her mood has been good.  GERD- reports that she has occasional gerd as long as she watches what she eats.    History of compression fracture noted incidentally back in 2016 on cxr.  A bone density test was ordered at that time but was cancelled for some reason.     Observations/Objective:   Gen: Awake, alert, no acute distress Resp: Breathing is even and non-labored Psych: calm/pleasant demeanor Neuro: Alert and Oriented x 3, + facial symmetry, speech is clear.   Assessment and Plan:  Anxiety/Depression- stable without medication.  Continue to monitor.  HTN- has had some variable BP readings and one low reading.  Advised pt to decrease amlodipine form 5mg  to 2.5mg  once daily. Check bp once daily for 1 week and then send me readings via mychart for review.  GERD- stable without protonix.    Hx of compression fracture/osteopenia on xray- will order bone density for further evaluation.   Follow Up Instructions:    I discussed the assessment and treatment plan with the patient. The patient was provided an opportunity to ask questions and all were answered. The patient agreed with the plan and demonstrated an understanding of the instructions.   The patient was advised to call back or seek an in-person evaluation if the symptoms worsen or if the condition fails to improve as anticipated.    Lemont Fillers, NP

## 2019-04-23 ENCOUNTER — Ambulatory Visit: Payer: BLUE CROSS/BLUE SHIELD | Admitting: Family

## 2019-04-24 ENCOUNTER — Ambulatory Visit (HOSPITAL_BASED_OUTPATIENT_CLINIC_OR_DEPARTMENT_OTHER)
Admission: RE | Admit: 2019-04-24 | Discharge: 2019-04-24 | Disposition: A | Discharge status: Home / Self Care | Payer: BLUE CROSS/BLUE SHIELD | Source: Ambulatory Visit | Attending: Family | Admitting: Family

## 2019-04-24 ENCOUNTER — Other Ambulatory Visit: Payer: Self-pay

## 2019-04-24 DIAGNOSIS — M8580 Other specified disorders of bone density and structure, unspecified site: Secondary | ICD-10-CM | POA: Diagnosis not present

## 2019-04-24 DIAGNOSIS — M8588 Other specified disorders of bone density and structure, other site: Secondary | ICD-10-CM | POA: Diagnosis not present

## 2019-04-25 ENCOUNTER — Telehealth: Payer: Self-pay | Admitting: Family

## 2019-04-25 ENCOUNTER — Encounter: Payer: Self-pay | Admitting: Family

## 2019-04-25 DIAGNOSIS — M8580 Other specified disorders of bone density and structure, unspecified site: Secondary | ICD-10-CM

## 2019-04-25 DIAGNOSIS — I10 Essential (primary) hypertension: Secondary | ICD-10-CM

## 2019-04-25 HISTORY — DX: Other specified disorders of bone density and structure, unspecified site: M85.80

## 2019-04-25 MED ORDER — CALCIUM CARBONATE-VITAMIN D 600-400 MG-UNIT PO CHEW: 1.0000 | CHEWABLE_TABLET | Freq: Two times a day (BID) | ORAL | Status: DC

## 2019-04-25 NOTE — Telephone Encounter (Signed)
Patient calling in and requesting a detailed message be left when call returned due to patient being at work.

## 2019-04-25 NOTE — Telephone Encounter (Signed)
Opened in error

## 2019-04-25 NOTE — Telephone Encounter (Signed)
LVM with this information for the patient.

## 2019-04-25 NOTE — Telephone Encounter (Signed)
Please let pt know that I reviewed her bone density. It notes mild bone thinning. I recommend that she add caltrate 600mg  with D bid.  I would also like to check a vitamin D level.  No rush. She can schedule lab visit at her convenience in the next few months.

## 2019-04-25 NOTE — Telephone Encounter (Signed)
Left detailed message with below recommendations.

## 2019-05-09 ENCOUNTER — Ambulatory Visit: Payer: Self-pay

## 2019-05-09 ENCOUNTER — Ambulatory Visit (INDEPENDENT_AMBULATORY_CARE_PROVIDER_SITE_OTHER): Payer: BC Managed Care – PPO | Admitting: Family

## 2019-05-09 DIAGNOSIS — Z20828 Contact with and (suspected) exposure to other viral communicable diseases: Secondary | ICD-10-CM | POA: Diagnosis not present

## 2019-05-09 DIAGNOSIS — Z20822 Contact with and (suspected) exposure to covid-19: Secondary | ICD-10-CM

## 2019-05-09 NOTE — Progress Notes (Signed)
Virtual Visit via Video Note  I connected with [name] on 05/09/19 at  2:40 PM EDT by a video enabled telemedicine application and verified that I am speaking with the correct person using two identifiers. This visit type was conducted due to national recommendations for restrictions regarding the COVID-19 Pandemic (e.g. social distancing).  This format is felt to be most appropriate for this patient at this time.   I discussed the limitations of evaluation and management by telemedicine and the availability of in person appointments. The patient expressed understanding and agreed to proceed.  Only the patient and myself were on today's video visit. The patient was at home and I was in my office at the time of today's visit.   History of Present Illness:  Patient is a 58 yr old female who presents today following covid-19 exposure at work. Reports that she had close contact with a customer on Sunday 05/04/19 while at work. Customer was an Charity fundraiser and contacted patient today to advise her that she tested positive for COVID-19. Patient reports that they both wore a mask.   Denies cough, sob, fever, myalgia, sore throat, headache.    Past Medical History:  Diagnosis Date  . Anemia   . Anxiety   . Depression   . GERD (gastroesophageal reflux disease)   . H. pylori infection   . Hx of cardiovascular stress test    Myoview 6/12:  diaph atten, no ischemia, low risk  . Hx of echocardiogram    Echo 6/12:  EF 50-55%, mild diast dysfn, mild LAE, mild MR  . Hypertension   . Irregular heart rhythm    unclear what arrhythmia  . Migraine   . Osteopenia 04/25/2019  . Pneumothorax    pneumonia  . Supraventricular tachycardia, paroxysmal ([facility])    a. visit to EF 03/2011;  b. Event monitor (@[facility] ) 6/12:  PVCs/PACs, 3 episodes of WCT (SVT with aberrancy)  . Torus palatinus      Social History   Socioeconomic History  . Marital status: Legally Separated    Spouse name: Not on file  . Number of  children: 2  . Years of education: Not on file  . Highest education level: Not on file  Occupational History  . Occupation: Conservator, museum/gallery: [employer]  Social Needs  . Financial resource strain: Not on file  . Food insecurity:    Worry: Not on file    Inability: Not on file  . Transportation needs:    Medical: Not on file    Non-medical: Not on file  Tobacco Use  . Smoking status: Never Smoker  . Smokeless tobacco: Never Used  Substance and Sexual Activity  . Alcohol use: No  . Drug use: No  . Sexual activity: Not on file  Lifestyle  . Physical activity:    Days per week: Not on file    Minutes per session: Not on file  . Stress: Not on file  Relationships  . Social connections:    Talks on phone: Not on file    Gets together: Not on file    Attends religious service: Not on file    Active member of club or organization: Not on file    Attends meetings of clubs or organizations: Not on file    Relationship status: Not on file  . Intimate partner violence:    Fear of current or ex partner: Not on file    Emotionally abused: Not on  file    Physically abused: Not on file    Forced sexual activity: Not on file  Other Topics Concern  . Not on file  Social History Narrative  . Not on file    Past Surgical History:  Procedure Laterality Date  . ABDOMINAL HYSTERECTOMY  2005  . CESAREAN SECTION     x 2  . PLEURAL SCARIFICATION      Family History  Problem Relation Age of Onset  . Depression Mother   . Diabetes Mother   . Heart disease Mother   . Hypertension Mother   . Arthritis Mother   . Mental illness Mother   . Hypertension Father   . Coronary artery disease Father   . Heart disease Father   . Heart failure Brother   . Heart disease Brother   . Hypertension Brother        x2  . Hypertension Sister   . Stomach cancer Neg Hx     No Known Allergies  Current Outpatient Medications on File Prior to Visit   Medication Sig Dispense Refill  . amLODipine (NORVASC) 2.5 MG tablet Take 1 tablet (2.5 mg total) by mouth daily. 90 tablet 1  . Calcium Carbonate-Vitamin D 600-400 MG-UNIT chew tablet Chew 1 tablet by mouth 2 (two) times daily.     No current facility-administered medications on file prior to visit.     There were no vitals taken for this visit.    Observations/Objective:   Gen: Awake, alert, no acute distress Resp: Breathing is even and non-labored Psych: calm/pleasant demeanor Neuro: Alert and Oriented x 3, + facial symmetry, speech is clear.  Assessment and Plan:  Covid-19 exposure- I advised pt per CDC recommendations she should self quarantine for 14 days following exposure and call if she becomes symptomatic so that we can arrange testing.  Pt verbalizes understanding.    Follow Up Instructions:    I discussed the assessment and treatment plan with the patient. The patient was provided an opportunity to ask questions and all were answered. The patient agreed with the plan and demonstrated an understanding of the instructions.   The patient was advised to call back or seek an in-person evaluation if the symptoms worsen or if the condition fails to improve as anticipated.    Lemont FillersMelissa S O'Sullivan, NP

## 2019-05-09 NOTE — Telephone Encounter (Signed)
Virtual visit scheduled.  

## 2019-05-09 NOTE — Telephone Encounter (Signed)
Call returned to patient.  She states that Sunday she spent an hour with a client and today that customer called to report that she tested positive for the COVID-19. Per Ms Kuyper she believes the customer wore a mask.  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 Ms Hinnenkamp has no symptoms. She has Hx of lung surgery and A-fib. Home care quarantine advice read to patient. Patient verbalized understanding of all instructions.  Call transferred to office for follow up.   Reason for Disposition . [1] COVID-19 EXPOSURE (Close Contact) AND [2] within last 14 days BUT [3] NO symptoms  Answer Assessment - Initial Assessment Questions 1. CLOSE CONTACT: "Who is the person with the confirmed or suspected COVID-19 infection that you were exposed to?"     customer 2. PLACE of CONTACT: "Where were you when you were exposed to COVID-19?" (e.g., home, school, medical waiting room; which city?)     work 3. TYPE of CONTACT: "How much contact was there?" (e.g., sitting next to, live in same house, work in same office, same building)    1 hour with conversation 4. DURATION of CONTACT: "How long were you in contact with the COVID-19 patient?" (e.g., a few seconds, passed by person, a few minutes, live with the patient)     1 hour 5. DATE of CONTACT: "When did you have contact with a COVID-19 patient?" (e.g., how many days ago)     Sunday 6. TRAVEL: "Have you traveled out of the country recently?" If so, "When and where?"     * Also ask about out-of-state travel, since the CDC has identified some high-risk cities for community spread in the [place].     * Note: Travel becomes less relevant if there is widespread community transmission where the patient lives.    no7. COMMUNITY SPREAD: "Are there lots of cases of COVID-19 (community spread) where you live?" (See public health department website, if unsure)       [place] 8. SYMPTOMS: "Do you have any symptoms?" (e.g., fever, cough, breathing difficulty)    no 9.  PREGNANCY OR POSTPARTUM: "Is there any chance you are pregnant?" "When was your last menstrual period?" "Did you deliver in the last 2 weeks?"     NA 10. HIGH RISK: "Do you have any heart or lung problems? Do you have a weak immune system?" (e.g., CHF, COPD, asthma, HIV positive, chemotherapy, renal failure, diabetes mellitus, sickle cell anemia)      A fib, lung surgery,  Protocols used: CORONAVIRUS (COVID-19) EXPOSURE-A-AH

## 2019-05-10 ENCOUNTER — Encounter: Payer: Self-pay | Admitting: Family

## 2019-05-14 DIAGNOSIS — Z7189 Other specified counseling: Secondary | ICD-10-CM | POA: Diagnosis not present

## 2019-05-14 DIAGNOSIS — Z1159 Encounter for screening for other viral diseases: Secondary | ICD-10-CM | POA: Diagnosis not present

## 2019-05-14 DIAGNOSIS — Z20828 Contact with and (suspected) exposure to other viral communicable diseases: Secondary | ICD-10-CM | POA: Diagnosis not present

## 2019-05-15 DIAGNOSIS — Z20828 Contact with and (suspected) exposure to other viral communicable diseases: Secondary | ICD-10-CM | POA: Diagnosis not present

## 2019-06-05 DIAGNOSIS — I83813 Varicose veins of bilateral lower extremities with pain: Secondary | ICD-10-CM | POA: Diagnosis not present

## 2019-07-03 DIAGNOSIS — I8312 Varicose veins of left lower extremity with inflammation: Secondary | ICD-10-CM | POA: Diagnosis not present

## 2019-07-03 DIAGNOSIS — I8311 Varicose veins of right lower extremity with inflammation: Secondary | ICD-10-CM | POA: Diagnosis not present

## 2019-07-03 DIAGNOSIS — I83813 Varicose veins of bilateral lower extremities with pain: Secondary | ICD-10-CM | POA: Diagnosis not present

## 2019-07-28 MED FILL — AMLODIPINE 2.5 MG TABLET: 2.5 | 90 days supply | Qty: 90 | Fill #1

## 2019-08-06 MED FILL — AMLODIPINE 2.5 MG TABLET: 2.5 | 90 days supply | Qty: 90 | Fill #1

## 2019-10-02 ENCOUNTER — Other Ambulatory Visit: Payer: Self-pay

## 2019-10-02 ENCOUNTER — Ambulatory Visit (INDEPENDENT_AMBULATORY_CARE_PROVIDER_SITE_OTHER): Payer: BC Managed Care – PPO

## 2019-10-02 DIAGNOSIS — Z23 Encounter for immunization: Secondary | ICD-10-CM

## 2019-10-03 ENCOUNTER — Other Ambulatory Visit: Payer: Self-pay

## 2019-10-03 DIAGNOSIS — Z20822 Contact with and (suspected) exposure to covid-19: Secondary | ICD-10-CM

## 2019-10-04 LAB — NOVEL CORONAVIRUS, NAA: SARS-CoV-2, NAA: NOT DETECTED

## 2019-10-07 DIAGNOSIS — I8311 Varicose veins of right lower extremity with inflammation: Secondary | ICD-10-CM | POA: Diagnosis not present

## 2019-10-07 DIAGNOSIS — I83811 Varicose veins of right lower extremities with pain: Secondary | ICD-10-CM | POA: Diagnosis not present

## 2019-10-13 DIAGNOSIS — I8311 Varicose veins of right lower extremity with inflammation: Secondary | ICD-10-CM | POA: Diagnosis not present

## 2019-10-23 DIAGNOSIS — I83811 Varicose veins of right lower extremities with pain: Secondary | ICD-10-CM | POA: Diagnosis not present

## 2019-10-23 DIAGNOSIS — I8311 Varicose veins of right lower extremity with inflammation: Secondary | ICD-10-CM | POA: Diagnosis not present

## 2019-11-11 ENCOUNTER — Other Ambulatory Visit: Payer: Self-pay | Admitting: Family

## 2019-11-11 DIAGNOSIS — I8311 Varicose veins of right lower extremity with inflammation: Secondary | ICD-10-CM | POA: Diagnosis not present

## 2019-11-11 MED FILL — AMLODIPINE 2.5 MG TABLET: 2.5 | 90 days supply | Qty: 90 | Fill #0

## 2019-11-18 ENCOUNTER — Other Ambulatory Visit: Payer: Self-pay | Admitting: Family

## 2019-11-18 NOTE — Telephone Encounter (Signed)
Pt is due for follow up. Can be done in person or virtual, pt preference.

## 2019-11-19 MED FILL — AMLODIPINE 2.5 MG TABLET: 2.5 | 90 days supply | Qty: 90 | Fill #0

## 2019-11-19 NOTE — Telephone Encounter (Signed)
Doxy set up for Monday

## 2019-11-24 ENCOUNTER — Ambulatory Visit (INDEPENDENT_AMBULATORY_CARE_PROVIDER_SITE_OTHER): Payer: BC Managed Care – PPO | Admitting: Family

## 2019-11-24 ENCOUNTER — Other Ambulatory Visit: Payer: Self-pay

## 2019-11-24 ENCOUNTER — Encounter: Payer: Self-pay | Admitting: Family

## 2019-11-24 DIAGNOSIS — M8580 Other specified disorders of bone density and structure, unspecified site: Secondary | ICD-10-CM | POA: Diagnosis not present

## 2019-11-24 DIAGNOSIS — I10 Essential (primary) hypertension: Secondary | ICD-10-CM | POA: Diagnosis not present

## 2019-11-24 DIAGNOSIS — Z0000 Encounter for general adult medical examination without abnormal findings: Secondary | ICD-10-CM

## 2019-11-24 MED ORDER — AMLODIPINE BESYLATE 2.5 MG PO TABS: 2.5000 mg | ORAL_TABLET | Freq: Every day | ORAL | 0 refills | Status: DC

## 2019-11-24 NOTE — Progress Notes (Signed)
Virtual Visit via Video Note  I connected with [name] on 11/24/19 at 10:00 AM EST by a video enabled telemedicine application and verified that I am speaking with the correct person using two identifiers.  Location: Patient: home Provider: work   I discussed the limitations of evaluation and management by telemedicine and the availability of in person appointments. The patient expressed understanding and agreed to proceed.  History of Present Illness:  Patient is a 58 yr old female who presents today for follow up.  HTN- 122/93 this AM.  She is maintained on amlodipine 2.5mg  once daily. Previously she had not checked her blood pressure since October.   BP Readings from Last 3 Encounters:  01/23/19 133/87  12/11/18 119/79  12/09/18 (!) 151/79   Osteopenia- she states that she had some GI upset with caltrate so she stopped.   Past Medical History:  Diagnosis Date  . Anemia   . Anxiety   . Depression   . GERD (gastroesophageal reflux disease)   . H. pylori infection   . Hx of cardiovascular stress test    Myoview 6/12:  diaph atten, no ischemia, low risk  . Hx of echocardiogram    Echo 6/12:  EF 50-55%, mild diast dysfn, mild LAE, mild MR  . Hypertension   . Irregular heart rhythm    unclear what arrhythmia  . Migraine   . Osteopenia 04/25/2019  . Pneumothorax    pneumonia  . Supraventricular tachycardia, paroxysmal ([facility])    a. visit to EF 03/2011;  b. Event monitor (@[facility] ) 6/12:  PVCs/PACs, 3 episodes of WCT (SVT with aberrancy)  . Torus palatinus      Social History   Socioeconomic History  . Marital status: Legally Separated    Spouse name: Not on file  . Number of children: 2  . Years of education: Not on file  . Highest education level: Not on file  Occupational History  . Occupation: Occupational hygienist: [employer]  Tobacco Use  . Smoking status: Never Smoker  . Smokeless tobacco: Never Used  Substance  and Sexual Activity  . Alcohol use: No  . Drug use: No  . Sexual activity: Not on file  Other Topics Concern  . Not on file  Social History Narrative  . Not on file   Social Determinants of Health   Financial Resource Strain:   . Difficulty of Paying Living Expenses: Not on file  Food Insecurity:   . Worried About Charity fundraiser in the Last Year: Not on file  . Ran Out of Food in the Last Year: Not on file  Transportation Needs:   . Lack of Transportation (Medical): Not on file  . Lack of Transportation (Non-Medical): Not on file  Physical Activity:   . Days of Exercise per Week: Not on file  . Minutes of Exercise per Session: Not on file  Stress:   . Feeling of Stress : Not on file  Social Connections:   . Frequency of Communication with Friends and Family: Not on file  . Frequency of Social Gatherings with Friends and Family: Not on file  . Attends Religious Services: Not on file  . Active Member of Clubs or Organizations: Not on file  . Attends Archivist Meetings: Not on file  . Marital Status: Not on file  Intimate Partner Violence:   . Fear of Current or Ex-Partner: Not on file  . Emotionally Abused: Not on  file  . Physically Abused: Not on file  . Sexually Abused: Not on file    Past Surgical History:  Procedure Laterality Date  . ABDOMINAL HYSTERECTOMY  2005  . CESAREAN SECTION     x 2  . PLEURAL SCARIFICATION      Family History  Problem Relation Age of Onset  . Depression Mother   . Diabetes Mother   . Heart disease Mother   . Hypertension Mother   . Arthritis Mother   . Mental illness Mother   . Hypertension Father   . Coronary artery disease Father   . Heart disease Father   . Heart failure Brother   . Heart disease Brother   . Hypertension Brother        x2  . Hypertension Sister   . Stomach cancer Neg Hx     No Known Allergies  Current Outpatient Medications on File Prior to Visit  Medication Sig Dispense Refill  .  Calcium Carbonate-Vitamin D 600-400 MG-UNIT chew tablet Chew 1 tablet by mouth 2 (two) times daily.     No current facility-administered medications on file prior to visit.    There were no vitals taken for this visit.     Observations/Objective:   Gen: Awake, alert, no acute distress Resp: Breathing is even and non-labored Psych: calm/pleasant demeanor Neuro: Alert and Oriented x 3, + facial symmetry, speech is clear.   Assessment and Plan:  HTN-DBP mildly elevated. I advised her to check her blood pressure twice a week and let me know if DBP remains elevated.  Osteopenia- she plans to retry the caltrate. If she can't tolerate I recommended that she take tums ES one tab BID and add vit D 2000 iu once daily.  She is due for mammogram- will order.     Follow Up Instructions:    I discussed the assessment and treatment plan with the patient. The patient was provided an opportunity to ask questions and all were answered. The patient agreed with the plan and demonstrated an understanding of the instructions.   The patient was advised to call back or seek an in-person evaluation if the symptoms worsen or if the condition fails to improve as anticipated.  Lemont Fillers, NP

## 2019-11-26 DIAGNOSIS — I8311 Varicose veins of right lower extremity with inflammation: Secondary | ICD-10-CM | POA: Diagnosis not present

## 2019-11-26 DIAGNOSIS — M7981 Nontraumatic hematoma of soft tissue: Secondary | ICD-10-CM | POA: Diagnosis not present

## 2019-12-01 ENCOUNTER — Ambulatory Visit: Payer: Self-pay | Admitting: *Deleted

## 2019-12-01 NOTE — Telephone Encounter (Signed)
Pt states that BP approximately 10 min before calling the nurse was 161/102. Pt states that she started feeling bad at work today. Pt states that she  got off at 6pm. Nose bleeding but not that bad. Nose bleeding stopped by the end of call with nurse. Pt states she has experienced nose bleeds in the past but it was a while ago.   Pt states that she experienced one episode of diarrhea today after getting home from work. Pt states that when she is upset she does experience diarrhea. 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 No known exposure to COVID. BP taken while on the phone with nurse and was noted to be 154/106, P-81.Pt states that she is on BP medications and took her dose this morning. This morning BP was 137/90 and yesterday it was 125/94. Has slight dizziness feels tired.Felt a little tired yesterday but today head started hurting a hour before leaving work. Pt not able to rate headache pain at this time and states that it is more annoying can't rate but I know it is there. Feels better now than it was prior to calling. Patient given home care advice and advised to seek treatment in the ED for worsening symptoms. Pt verbalized understanding. Pt can be contacted at [phone] to schedule appt with PCP.  Reason for Disposition . Systolic BP  >= 656 OR Diastolic >= 812 . [1] Mild-moderate nosebleed AND [2] bleeding stopped now  Answer Assessment - Initial Assessment Questions 1. BLOOD PRESSURE: "What is the blood pressure?" "Did you take at least two measurements 5 minutes apart?"     161/102 2. ONSET: "When did you take your blood pressure?"    About 10 min prior to calling 3. HOW: "How did you obtain the blood pressure?" (e.g., visiting nurse, automatic home BP monitor)     Home  4. HISTORY: "Do you have a history of high blood pressure?"     yes 5. MEDICATIONS: "Are you taking any medications for blood pressure?" "Have you missed any doses recently?"     Yes and takes BP in the morning 6. OTHER  SYMPTOMS: "Do you have any symptoms?" (e.g., headache, chest pain, blurred vision, difficulty breathing, weakness)     Headache but feels tired and has diarrhea-just one 7. PREGNANCY: "Is there any chance you are pregnant?" "When was your last menstrual period?"     N/a  Protocols used: [place], NOSEBLEED-A-AH

## 2019-12-02 NOTE — Telephone Encounter (Signed)
Attempted to contact patient. No answer. Left detailed voicemail advising patient to contact the office for an in person visit tomorrow if not feeling better. If she has any further questions she can send a message via my chart.

## 2019-12-02 NOTE — Telephone Encounter (Signed)
Called patient regarding bp. Patient states she was in bed and had not moved from bed due to not feeling well. Patient was able to get up and get her bp while on the phone with me. BP on call was 135/95. Patient states she has had a HA and a lot of fatigue over the past day. Advised of any worsening symptoms to go to ED advised I would give call back once I speak with provider regarding symptoms.  Please advise.

## 2020-02-11 ENCOUNTER — Encounter: Payer: Self-pay | Admitting: Family

## 2020-02-11 ENCOUNTER — Ambulatory Visit: Payer: BC Managed Care – PPO | Admitting: Family

## 2020-02-11 ENCOUNTER — Other Ambulatory Visit: Payer: Self-pay

## 2020-02-11 VITALS — BP 144/95 | HR 64 | Temp 96.0°F | Resp 16 | Ht 70.0 in | Wt 158.0 lb

## 2020-02-11 DIAGNOSIS — N63 Unspecified lump in unspecified breast: Secondary | ICD-10-CM | POA: Diagnosis not present

## 2020-02-11 DIAGNOSIS — I10 Essential (primary) hypertension: Secondary | ICD-10-CM

## 2020-02-11 NOTE — Progress Notes (Signed)
Subjective:    Patient ID: [name], female    DOB: Sep 25, 1961, 59 y.o.   MRN: [id number]  HPI  Patient is a 59 yr old female who presents today with chief complaint of lump on her right breast. Noticed 4 weeks ago. Initially she thought she pulled a muscle.   Reports bp running 130's BP Readings from Last 3 Encounters:  02/11/20 (!) 144/95  01/23/19 133/87  12/11/18 119/79      Review of Systems See HPI    Past Medical History:  Diagnosis Date  . Anemia   . Anxiety   . Depression   . GERD (gastroesophageal reflux disease)   . H. pylori infection   . Hx of cardiovascular stress test    Myoview 6/12:  diaph atten, no ischemia, low risk  . Hx of echocardiogram    Echo 6/12:  EF 50-55%, mild diast dysfn, mild LAE, mild MR  . Hypertension   . Irregular heart rhythm    unclear what arrhythmia  . Migraine   . Osteopenia 04/25/2019  . Pneumothorax    pneumonia  . Supraventricular tachycardia, paroxysmal ([facility])    a. visit to EF 03/2011;  b. Event monitor (@[facility] ) 6/12:  PVCs/PACs, 3 episodes of WCT (SVT with aberrancy)  . Torus palatinus      Social History   Socioeconomic History  . Marital status: Legally Separated    Spouse name: Not on file  . Number of children: 2  . Years of education: Not on file  . Highest education level: Not on file  Occupational History  . 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 Smoking status: Never Smoker  . Smokeless tobacco: Never Used  Substance and Sexual Activity  . Alcohol use: No  . Drug use: No  . Sexual activity: Not on file  Other Topics Concern  . Not on file  Social History Narrative  . Not on file   Social Determinants of Health   Financial Resource Strain:   . Difficulty of Paying Living Expenses: Not on file  Food Insecurity:   . Worried About Conservator, museum/gallery in the Last Year: Not on file  . Ran Out of Food in the Last Year: Not on file    Transportation Needs:   . Lack of Transportation (Medical): Not on file  . Lack of Transportation (Non-Medical): Not on file  Physical Activity:   . Days of Exercise per Week: Not on file  . Minutes of Exercise per Session: Not on file  Stress:   . Feeling of Stress : Not on file  Social Connections:   . Frequency of Communication with Friends and Family: Not on file  . Frequency of Social Gatherings with Friends and Family: Not on file  . Attends Religious Services: Not on file  . Active Member of Clubs or Organizations: Not on file  . Attends Programme researcher, broadcasting/film/video Meetings: Not on file  . Marital Status: Not on file  Intimate Partner Violence:   . Fear of Current or Ex-Partner: Not on file  . Emotionally Abused: Not on file  . Physically Abused: Not on file  . Sexually Abused: Not on file    Past Surgical History:  Procedure Laterality Date  . ABDOMINAL HYSTERECTOMY  2005  . CESAREAN SECTION     x 2  . PLEURAL SCARIFICATION      Family History  Problem Relation Age of Onset  .  Depression Mother   . Diabetes Mother   . Heart disease Mother   . Hypertension Mother   . Arthritis Mother   . Mental illness Mother   . Hypertension Father   . Coronary artery disease Father   . Heart disease Father   . Heart failure Brother   . Heart disease Brother   . Hypertension Brother        x2  . Hypertension Sister   . Stomach cancer Neg Hx     No Known Allergies  Current Outpatient Medications on File Prior to Visit  Medication Sig Dispense Refill  . amLODipine (NORVASC) 2.5 MG tablet Take 1 tablet (2.5 mg total) by mouth daily. 90 tablet 0  . Calcium Carbonate-Vitamin D 600-400 MG-UNIT chew tablet Chew 1 tablet by mouth 2 (two) times daily. (Patient not taking: Reported on 02/11/2020)     No current facility-administered medications on file prior to visit.    BP (!) 144/95 (BP Location: Right Arm, Patient Position: Sitting, Cuff Size: Small)   Pulse 64   Temp (!) 96 F  (35.6 C) (Temporal)   Resp 16   Ht 5\' 10"  (1.778 m)   Wt 158 lb (71.7 kg)   SpO2 100%   BMI 22.67 kg/m    Objective:   Physical Exam Constitutional:      Appearance: She is well-developed.  Neck:     Thyroid: No thyromegaly.  Cardiovascular:     Rate and Rhythm: Normal rate and regular rhythm.     Heart sounds: Normal heart sounds. No murmur.  Pulmonary:     Effort: Pulmonary effort is normal. No respiratory distress.     Breath sounds: Normal breath sounds. No wheezing.  Musculoskeletal:     Cervical back: Neck supple.  Skin:    General: Skin is warm and dry.  Neurological:     Mental Status: She is alert and oriented to person, place, and time.  Psychiatric:        Behavior: Behavior normal.        Thought Content: Thought content normal.        Judgment: Judgment normal.   Breasts:  R breast note is made of a firm pea sized mass at 5 oclock adjacent to sternum- appears to have opening consistent with sebaceous cyst.  L breast is normal.        Assessment & Plan:  R breast mass- most consistent with sebaceous cyst. However will obtain diagnostic mammo/US to confirm.  HTN- bp mildly elevated today. She reports good compliance with amlodipine. She reports better home readings. May be anxious today due to breast mass.  I have asked her to check blood pressure once daily x 1 week and send me her readings via mychart.   This visit occurred during the SARS-CoV-2 public health emergency.  Safety protocols were in place, including screening questions prior to the visit, additional usage of staff PPE, and extensive cleaning of exam room while observing appropriate contact time as indicated for disinfecting solutions.

## 2020-02-11 NOTE — Patient Instructions (Signed)
Please check your blood pressure once daily for 1 week, then send me your readings via mychart. You should be contacted about scheduling your breast imaging at the breast center.

## 2020-02-18 ENCOUNTER — Telehealth: Payer: Self-pay | Admitting: Family

## 2020-02-18 NOTE — Telephone Encounter (Signed)
See mychart.  

## 2020-02-20 MED FILL — AMLODIPINE 2.5 MG TABLET: 2.5 | 90 days supply | Qty: 90 | Fill #0

## 2020-02-26 ENCOUNTER — Other Ambulatory Visit: Payer: Self-pay

## 2020-02-26 ENCOUNTER — Ambulatory Visit
Admission: RE | Admit: 2020-02-26 | Discharge: 2020-02-26 | Disposition: A | Discharge status: Home / Self Care | Payer: PRIVATE HEALTH INSURANCE | Source: Ambulatory Visit | Attending: Family | Admitting: Family

## 2020-02-26 ENCOUNTER — Ambulatory Visit
Admission: RE | Admit: 2020-02-26 | Discharge: 2020-02-26 | Disposition: A | Discharge status: Home / Self Care | Payer: BC Managed Care – PPO | Source: Ambulatory Visit | Attending: Family | Admitting: Family

## 2020-02-26 DIAGNOSIS — N63 Unspecified lump in unspecified breast: Secondary | ICD-10-CM

## 2020-05-27 MED FILL — AMLODIPINE 2.5 MG TABLET: 2.5 | 90 days supply | Qty: 90 | Fill #0

## 2020-08-21 ENCOUNTER — Other Ambulatory Visit: Payer: Self-pay

## 2020-08-21 ENCOUNTER — Encounter: Payer: Self-pay | Admitting: Family Medicine

## 2020-08-21 ENCOUNTER — Telehealth (INDEPENDENT_AMBULATORY_CARE_PROVIDER_SITE_OTHER): Payer: PRIVATE HEALTH INSURANCE | Admitting: Family Medicine

## 2020-08-21 DIAGNOSIS — R5383 Other fatigue: Secondary | ICD-10-CM

## 2020-08-21 DIAGNOSIS — J0100 Acute maxillary sinusitis, unspecified: Secondary | ICD-10-CM

## 2020-08-21 DIAGNOSIS — J301 Allergic rhinitis due to pollen: Secondary | ICD-10-CM

## 2020-08-21 DIAGNOSIS — H8110 Benign paroxysmal vertigo, unspecified ear: Secondary | ICD-10-CM

## 2020-08-21 MED ORDER — MECLIZINE HCL 25 MG PO TABS: 25.0000 mg | ORAL_TABLET | Freq: Three times a day (TID) | ORAL | 0 refills | Status: DC | PRN

## 2020-08-21 MED ORDER — FLUTICASONE PROPIONATE 50 MCG/ACT NA SUSP: 1.0000 | Freq: Every day | NASAL | 6 refills | Status: DC

## 2020-08-21 MED ORDER — AZITHROMYCIN 250 MG PO TABS: ORAL_TABLET | ORAL | 0 refills | Status: DC

## 2020-08-21 NOTE — Progress Notes (Signed)
I have discussed the procedure for the virtual visit with the patient who has given consent to proceed with assessment and treatment.  ° °Sharlena Kristensen A Sahmya Arai, CMA ° ° ° ° °

## 2020-08-21 NOTE — Progress Notes (Signed)
Virtual Visit via Video Note  Subjective  CC:  Chief Complaint  Patient presents with   Nasal Congestion    x two weeks, drainage, [facility] [address] if medication needs to be sent in -states previous intolerance to amoxicillin    Dizziness    started yesterday, having to sit down frequently     Evaluated [facility]. New pt to me. Chart reviewed.  I connected with Bartholomew Boards on 08/21/20 at  9:20 AM EDT by a video enabled telemedicine application and verified that I am speaking with the correct person using two identifiers. Location patient: Home Location provider: [facility] [address] Office Persons participating in the virtual visit: [name], Willow Ora, MD Adela Glimpse CMA  I discussed the limitations of evaluation and management by telemedicine and the availability of in person appointments. The patient expressed understanding and agreed to proceed. HPI: [name] is a 59 y.o. female who was contacted today to address the problems listed above in the chief complaint.  59 year old mostly healthy female with history of hypertension presents due to new onset vertigo that started yesterday.  Over the last 2 weeks however she has had increasing nasal congestion, thick postnasal drainage, frontal pressure with mild headache and significant fatigue without fever, cough, sore throat, loss of taste or smell, shortness of breath.  She is fully vaccinated against Covid.  She has had no known exposures.  She has had a sinus infection in the past and has mild allergies although they have not been very active this summer.  Her vertigo is described as dizziness with head movement.  She noted if she turned her head yesterday at work she got very dizzy.  Today she is feeling significantly better.  She said no GI symptoms.  She denies paresis, significant headache, diplopia, dysarthria.   Assessment  1.  Benign paroxysmal positional vertigo, unspecified laterality   2. Acute non-recurrent maxillary sinusitis   3. Seasonal allergic rhinitis due to pollen   4. Other fatigue      Plan   Symptom complex most consistent with allergies, possible secondary bacterial sinus infection and positional vertigo: Educated on differential diagnosis, etiology of the vertigo and treatment options. Elect Flonase, Z-Pak and meclizine as needed.  Safety issues discussed in detail.  Symptoms are not typical of Covid but if worsening I do recommend a Covid test.  If vertigo worsens or does not improve over time, follow-up with PCP.  Total time spent reviewing chart and with patient, 30 minutes. I discussed the assessment and treatment plan with the patient. The patient was provided an opportunity to ask questions and all were answered. The patient agreed with the plan and demonstrated an understanding of the instructions.   The patient was advised to call back or seek an in-person evaluation if the symptoms worsen or if the condition fails to improve as anticipated. Follow up: As needed Visit date not found  Meds ordered this encounter  Medications   fluticasone (FLONASE) 50 MCG/ACT nasal spray    Sig: Place 1 spray into both nostrils daily.    Dispense:  16 g    Refill:  6   meclizine (ANTIVERT) 25 MG tablet    Sig: Take 1 tablet (25 mg total) by mouth 3 (three) times daily as needed for dizziness.    Dispense:  30 tablet    Refill:  0   azithromycin (ZITHROMAX) 250  MG tablet    Sig: Take 2 tabs today, then 1 tab daily for 4 days    Dispense:  1 each    Refill:  0      I reviewed the patients updated PMH, FH, and SocHx.    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   Calcium Carbonate-Vitamin D 600-400 MG-UNIT chew tablet Chew 1 tablet by mouth 2 (two) times daily.    Allergies: Patient has No Known Allergies. Family History: Patient family history includes Arthritis in her mother; Coronary artery disease in her father; Depression in her mother; Diabetes in her mother; Heart disease in her brother, father, and mother; Heart failure in her brother; Hypertension in her brother, father, mother, and sister; Mental illness in her mother. Social History:  Patient  reports that she has never smoked. She has never used smokeless tobacco. She reports that she does not drink alcohol and does not use drugs.  Review of Systems: Constitutional: Negative for fever malaise or anorexia Cardiovascular: negative for chest pain Respiratory: negative for SOB or persistent cough Gastrointestinal: negative for abdominal pain  OBJECTIVE Vitals: There were no vitals taken for this visit. General: no acute distress , A&Ox3, No respiratory distress Normal speech  Willow Ora, MD

## 2020-08-25 ENCOUNTER — Other Ambulatory Visit: Payer: Self-pay | Admitting: Family

## 2020-08-25 MED FILL — AMLODIPINE 2.5 MG TABLET: 2.5 | 30 days supply | Qty: 30 | Fill #0

## 2020-09-02 ENCOUNTER — Other Ambulatory Visit: Payer: Self-pay

## 2020-09-02 DIAGNOSIS — Z20822 Contact with and (suspected) exposure to covid-19: Secondary | ICD-10-CM

## 2020-09-03 LAB — SARS-COV-2, NAA 2 DAY TAT

## 2020-09-03 LAB — NOVEL CORONAVIRUS, NAA: SARS-CoV-2, NAA: NOT DETECTED

## 2020-10-07 ENCOUNTER — Other Ambulatory Visit: Payer: Self-pay | Admitting: Family

## 2020-10-12 ENCOUNTER — Other Ambulatory Visit: Payer: Self-pay | Admitting: Family

## 2020-10-13 ENCOUNTER — Encounter: Payer: Self-pay | Admitting: Family

## 2020-10-13 ENCOUNTER — Other Ambulatory Visit: Payer: Self-pay

## 2020-10-13 ENCOUNTER — Other Ambulatory Visit: Payer: Self-pay | Admitting: Family

## 2020-10-13 ENCOUNTER — Ambulatory Visit: Payer: BC Managed Care – PPO | Admitting: Family

## 2020-10-13 VITALS — BP 137/93 | HR 79 | Temp 99.2°F | Resp 16 | Wt 161.0 lb

## 2020-10-13 DIAGNOSIS — Z23 Encounter for immunization: Secondary | ICD-10-CM

## 2020-10-13 DIAGNOSIS — I10 Essential (primary) hypertension: Secondary | ICD-10-CM

## 2020-10-13 MED ORDER — AMLODIPINE BESYLATE 2.5 MG PO TABS: 2.5000 mg | ORAL_TABLET | Freq: Every day | ORAL | 0 refills | Status: DC

## 2020-10-13 MED FILL — AMLODIPINE 2.5 MG TABLET: 2.5 | 90 days supply | Qty: 90 | Fill #0

## 2020-10-13 NOTE — Progress Notes (Signed)
Subjective:    Patient ID: [name], female    DOB: [birth year], 59 y.o.   MRN: [id number]  HPI  Patient is a 59 yr old female who presents today for follow up.   HTN-  BP Readings from Last 3 Encounters:  10/13/20 (!) 137/93  02/11/20 (!) 144/95  01/23/19 133/87   Reports that she ran out of amlodipine 2.5mg   1 week ago.   Completed pfizer vaccine series.  Flu shot today.    Reports that she was treated for sinusitis. Using flonase and claritin.  Reports zpak was helpful.  Symptoms are improved.   Review of Systems See HPI  Past Medical History:  Diagnosis Date   Anemia    Anxiety    Depression    GERD (gastroesophageal reflux disease)    H. pylori infection    Hx of cardiovascular stress test    Myoview 6/12:  diaph atten, no ischemia, low risk   Hx of echocardiogram    Echo 6/12:  EF 50-55%, mild diast dysfn, mild LAE, mild MR   Hypertension    Irregular heart rhythm    unclear what arrhythmia   Migraine    Osteopenia 04/25/2019   Pneumothorax    pneumonia   Supraventricular tachycardia, paroxysmal (HCC)    a. visit to EF 03/2011;  b. 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    Employer: [employer]  Tobacco Use   Smoking status: Never Smoker   Smokeless tobacco: Never Used  Substance and Sexual Activity   Alcohol use: No   Drug use: No   Sexual activity: Not on file  Other Topics Concern   Not on file  Social History Narrative   Not on file   Social Determinants of Health   Financial Resource Strain:    Difficulty of Paying Living Expenses: Not on file  Food Insecurity:    Worried  About Presenter, broadcasting in the Last Year: Not on file   Programme researcher, broadcasting/film/video of Food in the Last Year: Not on file  Transportation Needs:    Lack of Transportation (Medical): Not on file   Lack of Transportation (Non-Medical): Not on file  Physical Activity:    Days of Exercise per Week: Not on file   Minutes of Exercise per Session: Not on file  Stress:    Feeling of Stress : Not on file  Social Connections:    Frequency of Communication with Friends and Family: Not on file   Frequency of Social Gatherings with Friends and Family: Not on file   Attends Religious Services: Not on file   Active Member of Clubs or Organizations: Not on file   Attends [employer] Meetings: Not on file   Marital Status: Not on file  Intimate Partner Violence:    Fear of Current or Ex-Partner: Not on file   Emotionally Abused: Not on file   Physically Abused: Not on file   Sexually Abused: Not on file    Past Surgical History:  Procedure Laterality Date   ABDOMINAL HYSTERECTOMY  2005   CESAREAN SECTION  x 2   PLEURAL SCARIFICATION      Family History  Problem Relation Age of Onset   Depression Mother    Diabetes Mother    Heart disease Mother    Hypertension Mother    Arthritis Mother    Mental illness Mother    Hypertension Father    Coronary artery disease Father    Heart disease Father    Heart failure Brother    Heart disease Brother    Hypertension Brother        x2   Hypertension Sister    Stomach cancer Neg Hx     No Known Allergies  Current Outpatient Medications on File Prior to Visit  Medication Sig Dispense Refill   azithromycin (ZITHROMAX) 250 MG tablet Take 2 tabs today, then 1 tab daily for 4 days 1 each 0   Calcium Carbonate-Vitamin D 600-400 MG-UNIT chew tablet Chew 1 tablet by mouth 2 (two) times daily.     fluticasone (FLONASE) 50 MCG/ACT nasal spray Place 1 spray into both nostrils daily. 16 g 6   meclizine (ANTIVERT) 25 MG  tablet Take 1 tablet (25 mg total) by mouth 3 (three) times daily as needed for dizziness. 30 tablet 0   No current facility-administered medications on file prior to visit.    BP (!) 137/93 (BP Location: Right Arm, Patient Position: Sitting, Cuff Size: Small)    Pulse 79    Temp 99.2 F (37.3 C) (Oral)    Resp 16    Wt 161 lb (73 kg)    SpO2 99%    BMI 23.10 kg/m       Objective:   Physical Exam Constitutional:      Appearance: She is well-developed.  Cardiovascular:     Rate and Rhythm: Normal rate and regular rhythm.     Heart sounds: Normal heart sounds. No murmur heard.   Pulmonary:     Effort: Pulmonary effort is normal. No respiratory distress.     Breath sounds: Normal breath sounds. No wheezing.  Psychiatric:        Behavior: Behavior normal.        Thought Content: Thought content normal.        Judgment: Judgment normal.           Assessment & Plan:  HTN- DBP above goal. Advised pt to restart amlodipine 2.5mg  once daily.  Flu shot today.  This visit occurred during the SARS-CoV-2 public health emergency.  Safety protocols were in place, including screening questions prior to the visit, additional usage of staff PPE, and extensive cleaning of exam room while observing appropriate contact time as indicated for disinfecting solutions.

## 2020-10-13 NOTE — Patient Instructions (Signed)
Follow up in 3 months

## 2021-01-19 ENCOUNTER — Other Ambulatory Visit: Payer: Self-pay

## 2021-01-19 ENCOUNTER — Ambulatory Visit: Payer: BC Managed Care – PPO | Admitting: Family

## 2021-01-19 ENCOUNTER — Other Ambulatory Visit: Payer: Self-pay | Admitting: Family

## 2021-01-19 ENCOUNTER — Encounter: Payer: Self-pay | Admitting: Family

## 2021-01-19 VITALS — BP 139/94 | HR 65 | Temp 97.5°F | Resp 16 | Ht 70.0 in | Wt 159.0 lb

## 2021-01-19 DIAGNOSIS — M5416 Radiculopathy, lumbar region: Secondary | ICD-10-CM | POA: Diagnosis not present

## 2021-01-19 DIAGNOSIS — I10 Essential (primary) hypertension: Secondary | ICD-10-CM

## 2021-01-19 MED ORDER — NEBIVOLOL HCL 5 MG PO TABS: 5.0000 mg | ORAL_TABLET | Freq: Every day | ORAL | 3 refills | Status: DC

## 2021-01-19 MED ORDER — MELOXICAM 7.5 MG PO TABS: 7.5000 mg | ORAL_TABLET | Freq: Every day | ORAL | 0 refills | Status: DC

## 2021-01-19 MED ORDER — AMLODIPINE BESYLATE 5 MG PO TABS: 5.0000 mg | ORAL_TABLET | Freq: Every day | ORAL | 1 refills | Status: DC

## 2021-01-19 MED FILL — NEBIVOLOL HCL 5 MG TABS: 5 | 30 days supply | Qty: 30 | Fill #0

## 2021-01-19 MED FILL — MELOXICAM 7.5 MG TABLET: 7.5 | 14 days supply | Qty: 14 | Fill #0

## 2021-01-19 NOTE — Progress Notes (Signed)
Subjective:    Patient ID: [name], female    DOB: 1961/08/13, 60 y.o.   MRN: [id number]  HPI  HTN- Last visit we restarted amlodipine 2.5mg .  Reports that DBP has been as high as 102 at home.    BP Readings from Last 3 Encounters:  01/19/21 (!) 139/94  10/13/20 (!) 137/93  02/11/20 (!) 144/95   LLE numbness-  Some days it feels week.  Does not stop her from doing anything.  She denies low back pain.  Numbness is located on the LLE.  A few weeks ago she noted some numbness to the touch of the right shin.  She denies loss of bowel/bladder.     Review of Systems See HPI  Past Medical History:  Diagnosis Date  . Anemia   . Anxiety   . Depression   . GERD (gastroesophageal reflux disease)   . H. pylori infection   . Hx of cardiovascular stress test    Myoview 6/12:  diaph atten, no ischemia, low risk  . Hx of echocardiogram    Echo 6/12:  EF 50-55%, mild diast dysfn, mild LAE, mild MR  . Hypertension   . Irregular heart rhythm    unclear what arrhythmia  . Migraine   . Osteopenia 04/25/2019  . Pneumothorax    pneumonia  . Supraventricular tachycardia, paroxysmal (HCC)    a. visit to EF 03/2011;  b. Event monitor (@[facility] ) 6/12:  PVCs/PACs, 3 episodes of WCT (SVT with aberrancy)  . Torus palatinus      Social History   Socioeconomic History  . Marital status: Divorced    Spouse name: Not on file  . Number of children: 2  . Years of education: Not on file  . Highest education level: Not on file  Occupational History  . 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 Smoking status: Never Smoker  . Smokeless tobacco: Never Used  Substance and Sexual Activity  . Alcohol use: No  . Drug use: No  . Sexual activity: Not on file  Other Topics Concern  . Not on file  Social History Narrative  . Not on file   Social Determinants of Health   Financial Resource Strain: Not on file  Food Insecurity: Not on file   Transportation Needs: Not on file  Physical Activity: Not on file  Stress: Not on file  Social Connections: Not on file  Intimate Partner Violence: Not on file    Past Surgical History:  Procedure Laterality Date  . ABDOMINAL HYSTERECTOMY  2005  . CESAREAN SECTION     x 2  . PLEURAL SCARIFICATION      Family History  Problem Relation Age of Onset  . Depression Mother   . Diabetes Mother   . Heart disease Mother   . Hypertension Mother   . Arthritis Mother   . Mental illness Mother   . Hypertension Father   . Coronary artery disease Father   . Heart disease Father   . Heart failure Brother   . Heart disease Brother   . Hypertension Brother        x2  . Hypertension Sister   . Stomach cancer Neg Hx     No Known Allergies  Current Outpatient Medications on File Prior to Visit  Medication Sig Dispense Refill  . fluticasone (FLONASE) 50 MCG/ACT nasal spray Place 1 spray into both nostrils daily. 16 g 6   No current  facility-administered medications on file prior to visit.    BP (!) 139/94 (BP Location: Right Arm, Patient Position: Sitting, Cuff Size: Small)   Pulse 65   Temp (!) 97.5 F (36.4 C) (Temporal)   Resp 16   Ht 5\' 10"  (1.778 m)   Wt 159 lb (72.1 kg)   SpO2 100%   BMI 22.81 kg/m       Objective:   Physical Exam Constitutional:      Appearance: She is well-developed and well-nourished.  Cardiovascular:     Rate and Rhythm: Normal rate and regular rhythm.     Heart sounds: Normal heart sounds. No murmur heard.   Pulmonary:     Effort: Pulmonary effort is normal. No respiratory distress.     Breath sounds: Normal breath sounds. No wheezing.  Musculoskeletal:     Cervical back: Normal. No swelling or edema.     Thoracic back: Normal. No swelling or edema.     Lumbar back: Normal. No swelling or edema.  Neurological:     Deep Tendon Reflexes:     Reflex Scores:      Patellar reflexes are 2+ on the right side and 2+ on the left side.     Comments: Negative straight leg raise bilaterally Bilateral LE strength is 5/5 Sensation intact to monofilament LLE knee down  Psychiatric:        Mood and Affect: Mood and affect normal.        Behavior: Behavior normal.        Thought Content: Thought content normal.        Judgment: Judgment normal.           Assessment & Plan:  HTN- BP is above goal. She has  History of SVT for which she has seen cardiology in the remote past. Used to be on bystolic but stopped due to cost.  She reports intermittent episodes of palpitations.  She is willing to pay more. Will stop amlodipine (rx cancelled at pharmacy) and begin bystolic 5mg  once daily.    Lumbar radiculopathy- trial of meloxicam. Discussed x-ray of the lumbar spine. She wishes to hold off for now due to cost.  Discussed red flags.  This visit occurred during the SARS-CoV-2 public health emergency.  Safety protocols were in place, including screening questions prior to the visit, additional usage of staff PPE, and extensive cleaning of exam room while observing appropriate contact time as indicated for disinfecting solutions.

## 2021-01-19 NOTE — Patient Instructions (Signed)
Stop amlodipine. Start bystolic 5mg  once daily. Start meloxicam once daily for left leg numbness.

## 2021-02-02 ENCOUNTER — Ambulatory Visit: Payer: BC Managed Care – PPO | Admitting: Family

## 2021-02-16 ENCOUNTER — Other Ambulatory Visit: Payer: Self-pay

## 2021-02-16 ENCOUNTER — Encounter: Payer: Self-pay | Admitting: Family

## 2021-02-16 ENCOUNTER — Ambulatory Visit: Payer: BC Managed Care – PPO | Admitting: Family

## 2021-02-16 ENCOUNTER — Other Ambulatory Visit: Payer: Self-pay | Admitting: Family

## 2021-02-16 VITALS — BP 152/96 | HR 51 | Temp 98.3°F | Resp 16 | Ht 70.0 in | Wt 160.0 lb

## 2021-02-16 DIAGNOSIS — M5416 Radiculopathy, lumbar region: Secondary | ICD-10-CM | POA: Diagnosis not present

## 2021-02-16 DIAGNOSIS — I10 Essential (primary) hypertension: Secondary | ICD-10-CM | POA: Diagnosis not present

## 2021-02-16 MED ORDER — AMLODIPINE BESYLATE 5 MG PO TABS: 5.0000 mg | ORAL_TABLET | Freq: Every day | ORAL | 3 refills | Status: DC

## 2021-02-16 NOTE — Progress Notes (Signed)
Subjective:    Patient ID: [name], female    DOB: May 24, 1961, 60 y.o.   MRN: [id number]  HPI   Patient is a 60 yr old female who presents today for follow up.  Hypertension:  She reports that her BP is still high after taking Bystolic 5 mg daily. She endorses having a fast heart secondary to medications. She reports having a single episode of low blood pressure. She denies any chest pain, SOB, fever, abdominal pain, cough, chills, sore throat, dysuria, visual changes, urinary incontinence,  HA, or N/VD.   BP Readings from Last 3 Encounters:  02/16/21 (!) 152/96  01/19/21 (!) 139/94  10/13/20 (!) 137/93   Lumbar rediculopathy   She reports that her leg weakness has improved since last visit. She denies any back pain at this time but notes having tightness in her lower back. She denies any disturbance in daily activities.    Review of Systems  Constitutional: Negative for chills, fatigue and fever.  HENT: Negative for ear pain, rhinorrhea and sore throat.   Respiratory: Negative for cough and shortness of breath.   Cardiovascular: Negative for chest pain and palpitations.  Gastrointestinal: Negative for abdominal pain, diarrhea, nausea and vomiting.  Genitourinary: Negative for flank pain.  Musculoskeletal: Negative for back pain and neck pain.       (+)back tightness  Skin: Negative for rash.    Past Medical History:  Diagnosis Date  . Anemia   . Anxiety   . Depression   . GERD (gastroesophageal reflux disease)   . H. pylori infection   . Hx of cardiovascular stress test    Myoview 6/12:  diaph atten, no ischemia, low risk  . Hx of echocardiogram    Echo 6/12:  EF 50-55%, mild diast dysfn, mild LAE, mild MR  . Hypertension   . Irregular heart rhythm    unclear what arrhythmia  . Migraine   . Osteopenia 04/25/2019  . Pneumothorax    pneumonia  . Supraventricular tachycardia, paroxysmal (HCC)    a. visit to EF 03/2011;  b. Event monitor (@[facility] ) 6/12:  PVCs/PACs,  3 episodes of WCT (SVT with aberrancy)  . Torus palatinus      Social History   Socioeconomic History  . Marital status: Divorced    Spouse name: Not on file  . Number of children: 2  . Years of education: Not on file  . Highest education level: Not on file  Occupational History  . 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 Smoking status: Never Smoker  . Smokeless tobacco: Never Used  Substance and Sexual Activity  . Alcohol use: No  . Drug use: No  . Sexual activity: Not on file  Other Topics Concern  . Not on file  Social History Narrative  . Not on file   Social Determinants of Health   Financial Resource Strain: Not on file  Food Insecurity: Not on file  Transportation Needs: Not on file  Physical Activity: Not on file  Stress: Not on file  Social Connections: Not on file  Intimate Partner Violence: Not on file    Past Surgical History:  Procedure Laterality Date  . ABDOMINAL HYSTERECTOMY  2005  . CESAREAN SECTION     x 2  . PLEURAL SCARIFICATION      Family History  Problem Relation Age of Onset  . Depression Mother   . Diabetes Mother   . Heart disease Mother   .  Hypertension Mother   . Arthritis Mother   . Mental illness Mother   . Hypertension Father   . Coronary artery disease Father   . Heart disease Father   . Heart failure Brother   . Heart disease Brother   . Hypertension Brother        x2  . Hypertension Sister   . Stomach cancer Neg Hx     No Known Allergies  Current Outpatient Medications on File Prior to Visit  Medication Sig Dispense Refill  . fluticasone (FLONASE) 50 MCG/ACT nasal spray Place 1 spray into both nostrils daily. 16 g 6  . meloxicam (MOBIC) 7.5 MG tablet Take 1 tablet (7.5 mg total) by mouth daily. 14 tablet 0  . nebivolol (BYSTOLIC) 5 MG tablet Take 1 tablet (5 mg total) by mouth daily. 30 tablet 3   No current facility-administered medications on file prior  to visit.    BP (!) 152/96 (BP Location: Right Arm, Patient Position: Sitting, Cuff Size: Small)   Pulse (!) 51   Temp 98.3 F (36.8 C) (Oral)   Resp 16   Ht 5\' 10"  (1.778 m)   Wt 160 lb (72.6 kg)   SpO2 100%   BMI 22.96 kg/m       Objective:   Physical Exam Constitutional:      General: She is not in acute distress.    Appearance: Normal appearance. She is not ill-appearing.  HENT:     Head: Normocephalic and atraumatic.     Right Ear: External ear normal.     Left Ear: External ear normal.  Cardiovascular:     Rate and Rhythm: Normal rate and regular rhythm.     Pulses: Normal pulses.     Heart sounds: Normal heart sounds. No murmur heard.   Pulmonary:     Effort: Pulmonary effort is normal. No respiratory distress.     Breath sounds: Normal breath sounds. No wheezing or rales.  Musculoskeletal:     Cervical back: Normal range of motion.  Skin:    General: Skin is warm.  Neurological:     Mental Status: She is alert and oriented to person, place, and time.  Psychiatric:        Behavior: Behavior normal.        Assessment & Plan:  Hypertension  Uncotrolled  On BYSTOLIC 5 mg daily HR is 51 so will not increase dose   She does not wish to add second agent  Will discontinue BYSTOLIC and will start Amlodipine 5 mg daily    Lumbar Rediculopathy  Improved  Had mild GI upset with Meloxicam 7.5 mg  Will monitor   I,Alexis Bryant,acting as a scribe for , NP.,have documented all relevant documentation on the behalf of Lemont Fillers, NP,as directed by  Lemont Fillers, NP while in the presence of Lemont Fillers, NP.  I, Lemont Fillers, NP, have reviewed all documentation for this visit. The documentation on 02/16/21 for the exam, diagnosis, procedures, and orders are all accurate and complete.

## 2021-02-16 NOTE — Patient Instructions (Signed)
Stop bystolic, start amlodipine 5mg  once daily.

## 2021-03-23 ENCOUNTER — Other Ambulatory Visit: Payer: Self-pay

## 2021-03-23 ENCOUNTER — Ambulatory Visit: Payer: BC Managed Care – PPO | Admitting: Family

## 2021-03-23 DIAGNOSIS — M5416 Radiculopathy, lumbar region: Secondary | ICD-10-CM | POA: Diagnosis not present

## 2021-03-23 DIAGNOSIS — I10 Essential (primary) hypertension: Secondary | ICD-10-CM

## 2021-03-23 DIAGNOSIS — Z0000 Encounter for general adult medical examination without abnormal findings: Secondary | ICD-10-CM

## 2021-03-23 NOTE — Assessment & Plan Note (Addendum)
Improved on amlodipine 5mg  once daily. HR normal off of bystolic. Tolerating amlodipine without side effect. Continue same.

## 2021-03-23 NOTE — Progress Notes (Signed)
Subjective:   By signing my name below, I, Shehryar Baig, attest that this documentation has been prepared under the direction and in the presence of Sandford Craze, NP. 03/23/2021    Patient ID: [name], female    DOB: [birth year], 60 y.o.   MRN: [id number]  No chief complaint on file.   HPI Patient is in today for a office visit. She reports the numbness lumbar radiculopathy has resolve.  Immunizations- She is currently not UTD on her tetanus vaccination. She is not willing to get it at this time.    Allergies- She reports her allergy symptoms have improved and she no longer needs to take Flonase to manage her symptoms.  Hypertension- She is taking 5 mg amlodipine daily PO to manage her hypertension.  BP Readings from Last 3 Encounters:  03/23/21 131/88  02/16/21 (!) 152/96  01/19/21 (!) 139/94    Past Medical History:  Diagnosis Date  . Anemia   . Anxiety   . Depression   . GERD (gastroesophageal reflux disease)   . H. pylori infection   . Hx of cardiovascular stress test    Myoview 6/12:  diaph atten, no ischemia, low risk  . Hx of echocardiogram    Echo 6/12:  EF 50-55%, mild diast dysfn, mild LAE, mild MR  . Hypertension   . Irregular heart rhythm    unclear what arrhythmia  . Migraine   . Osteopenia 04/25/2019  . Pneumothorax    pneumonia  . Supraventricular tachycardia, paroxysmal (HCC)    a. visit to EF 03/2011;  b. Event monitor (@[facility] ) 6/12:  PVCs/PACs, 3 episodes of WCT (SVT with aberrancy)  . Torus palatinus     Past Surgical History:  Procedure Laterality Date  . ABDOMINAL HYSTERECTOMY  2005  . CESAREAN SECTION     x 2  . PLEURAL SCARIFICATION      Family History  Problem Relation Age of Onset  . Depression Mother   . Diabetes Mother   . Heart disease Mother   . Hypertension Mother   . Arthritis Mother   . Mental illness Mother   . Hypertension Father   . Coronary artery disease Father   . Heart disease Father   . Heart  failure Brother   . Heart disease Brother   . Hypertension Brother        x2  . Hypertension Sister   . Stomach cancer Neg Hx     Social History   Socioeconomic History  . Marital status: Divorced    Spouse name: Not on file  . Number of children: 2  . Years of education: Not on file  . Highest education level: Not on file  Occupational History  . Occupation: 2006: [employer]  Tobacco Use  . Smoking status: Never Smoker  . Smokeless tobacco: Never Used  Substance and Sexual Activity  . Alcohol use: No  . Drug use: No  . Sexual activity: Not on file  Other Topics Concern  . Not on file  Social History Narrative  . Not on file   Social Determinants of Health   Financial Resource Strain: Not on file  Food Insecurity: Not on file  Transportation Needs: Not on file  Physical Activity: Not on file  Stress: Not on file  Social Connections: Not on file  Intimate Partner Violence: Not on file    Outpatient Medications Prior to Visit  Medication Sig Dispense Refill  . amLODipine (  NORVASC) 5 MG tablet TAKE 1 TABLET (5 MG TOTAL) BY MOUTH DAILY. 30 tablet 3  . fluticasone (FLONASE) 50 MCG/ACT nasal spray Place 1 spray into both nostrils daily. 16 g 6  . meloxicam (MOBIC) 7.5 MG tablet TAKE 1 TABLET (7.5 MG TOTAL) BY MOUTH DAILY. 14 tablet 0   No facility-administered medications prior to visit.    No Known Allergies  ROS     Objective:    Physical Exam Cardiovascular:     Pulses: Normal pulses.     Heart sounds: Normal heart sounds.  Pulmonary:     Effort: Pulmonary effort is normal.     Breath sounds: Normal breath sounds.     There were no vitals taken for this visit. Wt Readings from Last 3 Encounters:  02/16/21 160 lb (72.6 kg)  01/19/21 159 lb (72.1 kg)  10/13/20 161 lb (73 kg)    Diabetic Foot Exam - Simple   No data filed    Lab Results  Component Value Date   WBC 5.9 12/11/2018   HGB 13.0  12/11/2018   HCT 40.4 12/11/2018   PLT 273 12/11/2018   GLUCOSE 92 12/11/2018   CHOL 191 01/19/2010   TRIG 46 01/19/2010   HDL 58 01/19/2010   LDLCALC 124 (H) 01/19/2010   ALT 20 12/11/2018   AST 21 12/11/2018   NA 139 12/11/2018   K 3.6 12/11/2018   CL 104 12/11/2018   CREATININE 0.63 12/11/2018   BUN 14 12/11/2018   CO2 27 12/11/2018   TSH 0.55 08/02/2016   INR 0.98 03/24/2011    Lab Results  Component Value Date   TSH 0.55 08/02/2016   Lab Results  Component Value Date   WBC 5.9 12/11/2018   HGB 13.0 12/11/2018   HCT 40.4 12/11/2018   MCV 75.2 (L) 12/11/2018   PLT 273 12/11/2018   Lab Results  Component Value Date   NA 139 12/11/2018   K 3.6 12/11/2018   CO2 27 12/11/2018   GLUCOSE 92 12/11/2018   BUN 14 12/11/2018   CREATININE 0.63 12/11/2018   BILITOT 0.7 12/11/2018   ALKPHOS 69 12/11/2018   AST 21 12/11/2018   ALT 20 12/11/2018   PROT 7.6 12/11/2018   ALBUMIN 4.6 12/11/2018   CALCIUM 9.3 12/11/2018   ANIONGAP 8 12/11/2018   GFR 80.57 05/18/2017   Lab Results  Component Value Date   CHOL 191 01/19/2010   Lab Results  Component Value Date   HDL 58 01/19/2010   Lab Results  Component Value Date   LDLCALC 124 (H) 01/19/2010   Lab Results  Component Value Date   TRIG 46 01/19/2010   Lab Results  Component Value Date   CHOLHDL 3.3 Ratio 01/19/2010   No results found for: HGBA1C     Assessment & Plan:   Problem List Items Addressed This Visit   None      No orders of the defined types were placed in this encounter.   I, Shehryar Tyson Alias, personally preformed the services described in this documentation.  All medical record entries made by the scribe were at my direction and in my presence.  I have reviewed the chart and discharge instructions (if applicable) and agree that the record reflects my personal performance and is accurate and complete. 03/23/2021   I,Shehryar Baig,acting as a scribe for Lemont Fillers, NP.,have  documented all relevant documentation on the behalf of Lemont Fillers, NP,as directed by  Lemont Fillers, NP while in  the presence of Lemont Fillers, NP.    Shehryar Baig   I, Lemont Fillers, NP, have reviewed all documentation for this visit. The documentation on 03/23/21 for the exam, diagnosis, procedures, and orders are all accurate and complete.

## 2021-03-23 NOTE — Assessment & Plan Note (Signed)
Resolved. Monitor.  

## 2021-03-30 ENCOUNTER — Ambulatory Visit (HOSPITAL_BASED_OUTPATIENT_CLINIC_OR_DEPARTMENT_OTHER)
Admission: RE | Admit: 2021-03-30 | Discharge: 2021-03-30 | Disposition: A | Discharge status: Home / Self Care | Payer: BC Managed Care – PPO | Source: Ambulatory Visit | Attending: Family | Admitting: Family

## 2021-03-30 ENCOUNTER — Other Ambulatory Visit: Payer: Self-pay

## 2021-03-30 DIAGNOSIS — Z0000 Encounter for general adult medical examination without abnormal findings: Secondary | ICD-10-CM | POA: Insufficient documentation

## 2021-03-30 DIAGNOSIS — Z1231 Encounter for screening mammogram for malignant neoplasm of breast: Secondary | ICD-10-CM | POA: Diagnosis not present

## 2021-05-23 ENCOUNTER — Other Ambulatory Visit (HOSPITAL_BASED_OUTPATIENT_CLINIC_OR_DEPARTMENT_OTHER): Payer: Self-pay

## 2021-05-23 MED FILL — Amlodipine Besylate Tab 5 MG (Base Equivalent): ORAL | 30 days supply | Qty: 30 | Fill #0 | Status: AC

## 2021-05-24 ENCOUNTER — Telehealth (INDEPENDENT_AMBULATORY_CARE_PROVIDER_SITE_OTHER): Payer: BC Managed Care – PPO | Admitting: Family

## 2021-05-24 ENCOUNTER — Other Ambulatory Visit (HOSPITAL_BASED_OUTPATIENT_CLINIC_OR_DEPARTMENT_OTHER): Payer: Self-pay

## 2021-05-24 ENCOUNTER — Telehealth: Payer: Self-pay

## 2021-05-24 DIAGNOSIS — U071 COVID-19: Secondary | ICD-10-CM | POA: Diagnosis not present

## 2021-05-24 HISTORY — DX: COVID-19: U07.1

## 2021-05-24 MED ORDER — MOLNUPIRAVIR 200 MG PO CAPS
800.0000 mg | ORAL_CAPSULE | Freq: Two times a day (BID) | ORAL | 0 refills | Status: AC
  Filled 2021-05-24: qty 40, 5d supply, fill #0

## 2021-05-24 NOTE — Assessment & Plan Note (Signed)
New. No recent Cr available. Will initiate molnupiravir 800mg  PO BID x 5 days.  Advised of CDC guidelines for self isolation/ ending isolation.  Advised of safe practice guidelines. Symptom Tier reviewed.  Encouraged to monitor for any worsening symptoms; watch for increased shortness of breath, weakness, and signs of dehydration. Advised when to seek emergency care.  Instructed to rest and hydrate well.  Advised to leave the house during recommended isolation period, only if it is necessary to seek medical care

## 2021-05-24 NOTE — Telephone Encounter (Signed)
Patient scheduled for virtual visit today.  

## 2021-05-24 NOTE — Telephone Encounter (Signed)
Nurse Assessment Nurse: Renne Crigler RN, Sherlie Ban Date/Time (Eastern Time): 05/24/2021 7:12:04 AM Confirm and document reason for call. If symptomatic, describe symptoms. ---Caller tested positive for covid yesterday with home test. She has a headache, tiredness, dry cough, and sore throat. Does the patient have any new or worsening symptoms? ---Yes Will a triage be completed? ---Yes Related visit to physician within the last 2 weeks? ---No Does the PT have any chronic conditions? (i.e. diabetes, asthma, this includes High risk factors for pregnancy, etc.) ---Yes List chronic conditions. ---HTN Is this a behavioral health or substance abuse call? ---No Guidelines Guideline Title Affirmed Question Affirmed Notes Nurse Date/Time (Eastern Time) COVID-19 - Diagnosed or Suspected [1] COVID-19 diagnosed by positive lab test (e.g., PCR, rapid self-test kit) AND [2] mild symptoms (e.g., cough, fever, others) AND [3] no complications or SOB Hearon, RN, Sherlie Ban 05/24/2021 7:14:04 AM PLEASE NOTE: All timestamps contained within this report are represented as [place] Standard Time. CONFIDENTIALTY NOTICE: This fax transmission is intended only for the addressee. It contains information that is legally privileged, confidential or otherwise protected from use or disclosure. If you are not the intended recipient, you are strictly prohibited from reviewing, disclosing, copying using or disseminating any of this information or taking any action in reliance on or regarding this information. If you have received this fax in error, please notify us immediately by telephone so that we can arrange for its return to [place]. Phone: [phone], Toll-Free: [phone], Fax: [phone] Page: 2 of 2 Call Id: [id number] [place]. Time Eilene Ghazi Time) Disposition Final User 05/24/2021 7:21:05 AM Home Care Yes Hearon, RN, Sherlie Ban Caller Disagree/Comply Comply Caller Understands Yes PreDisposition Did not know what to  do Care Advice Given Per Guideline HOME CARE: * You should be able to treat this at home. PAIN AND FEVER MEDICINES: * For pain or fever relief, take either acetaminophen or ibuprofen. * HOME REMEDY - HONEY: This old home remedy has been shown to help decrease coughing at night. The adult dosage is 2 teaspoons (10 ml) at bedtime. COUGH MEDICINES: * Feeling dehydrated: Drink extra liquids. If the air in your home is dry, use a humidifier. GENERAL CARE ADVICE FOR COVID-19 SYMPTOMS: CARE ADVICE given per COVID-19 - DIAGNOSED OR SUSPECTED (Adult) guideline. * You become worse CALL BACK IF: * Chest pain or difficulty breathing occurs

## 2021-05-24 NOTE — Progress Notes (Signed)
MyChart Video Visit    Virtual Visit via Video Note   This visit type was conducted due to national recommendations for restrictions regarding the COVID-19 Pandemic (e.g. social distancing) in an effort to limit this patient's exposure and mitigate transmission in our community. This patient is at least at moderate risk for complications without adequate follow up. This format is felt to be most appropriate for this patient at this time. Physical exam was limited by quality of the video and audio technology used for the visit. CMA was able to get the patient set up on a video visit.  Patient location: home Patient and provider in visit Provider location: Office  I discussed the limitations of evaluation and management by telemedicine and the availability of in person appointments. The patient expressed understanding and agreed to proceed.  Visit Date: 05/24/2021  Today's healthcare provider: Lemont Fillers, NP     Subjective:    Patient ID: [name], female    DOB: 1961-09-11, 60 y.o.   MRN: [id number]  Chief Complaint  Patient presents with   Covid Positive    Patient reports she tested positive for covid yesterday   Covid symptoms    Headache, sore throat, body aches, fatigue and cough.     HPI Patient is in today to discuss COVID-19 illness. States that symptoms began on Sunday 6/19 while at work.  Sunday she felt body aches. Sunday night she had a "horrible HA" which woke her up along with a sore throat.  She took a test and it was positive yesterday. Had 3 covid shots, did not have the 4th.   Past Medical History:  Diagnosis Date   Anemia    Anxiety    Depression    GERD (gastroesophageal reflux disease)    H. pylori infection    Hx of cardiovascular stress test    Myoview 6/12:  diaph atten, no ischemia, low risk   Hx of echocardiogram    Echo 6/12:  EF 50-55%, mild diast dysfn, mild LAE, mild MR   Hypertension    Irregular heart rhythm    unclear  what arrhythmia   Migraine    Osteopenia 04/25/2019   Pneumothorax    pneumonia   Supraventricular tachycardia, paroxysmal (HCC)    a. visit to EF 03/2011;  b. Event monitor (@[facility] ) 6/12:  PVCs/PACs, 3 episodes of WCT (SVT with aberrancy)   Torus palatinus     Past Surgical History:  Procedure Laterality Date   ABDOMINAL HYSTERECTOMY  2005   CESAREAN SECTION     x 2   PLEURAL SCARIFICATION      Family History  Problem Relation Age of Onset   Depression Mother    Diabetes Mother    Heart disease Mother    Hypertension Mother    Arthritis Mother    Mental illness Mother    Hypertension Father    Coronary artery disease Father    Heart disease Father    Heart failure Brother    Heart disease Brother    Hypertension Brother        x2   Hypertension Sister    Stomach cancer Neg Hx     Social History   Socioeconomic History   Marital status: Divorced    Spouse name: Not on file   Number of children: 2   Years of education: Not on file   Highest education level: Not on file  Occupational History   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  Employer: [employer]  Tobacco Use   Smoking status: Never   Smokeless tobacco: Never  Substance and Sexual Activity   Alcohol use: No   Drug use: No   Sexual activity: Not on file  Other Topics Concern   Not on file  Social History Narrative   Not on file   Social Determinants of Health   Financial Resource Strain: Not on file  Food Insecurity: Not on file  Transportation Needs: Not on file  Physical Activity: Not on file  Stress: Not on file  Social Connections: Not on file  Intimate Partner Violence: Not on file    Outpatient Medications Prior to Visit  Medication Sig Dispense Refill   amLODipine (NORVASC) 5 MG tablet TAKE 1 TABLET (5 MG TOTAL) BY MOUTH DAILY. 30 tablet 3   No facility-administered medications prior to visit.    No Known Allergies  ROS     Objective:    Physical  Exam Constitutional:      Appearance: She is well-developed. She is ill-appearing.  HENT:     Head:     Comments: Voice hoarsness Pulmonary:     Effort: Pulmonary effort is normal. No respiratory distress.  Neurological:     Mental Status: She is alert.  Psychiatric:        Behavior: Behavior normal.        Thought Content: Thought content normal.        Judgment: Judgment normal.    There were no vitals taken for this visit. Wt Readings from Last 3 Encounters:  03/23/21 158 lb (71.7 kg)  02/16/21 160 lb (72.6 kg)  01/19/21 159 lb (72.1 kg)    Diabetic Foot Exam - Simple   No data filed    Lab Results  Component Value Date   WBC 5.9 12/11/2018   HGB 13.0 12/11/2018   HCT 40.4 12/11/2018   PLT 273 12/11/2018   GLUCOSE 92 12/11/2018   CHOL 191 01/19/2010   TRIG 46 01/19/2010   HDL 58 01/19/2010   LDLCALC 124 (H) 01/19/2010   ALT 20 12/11/2018   AST 21 12/11/2018   NA 139 12/11/2018   K 3.6 12/11/2018   CL 104 12/11/2018   CREATININE 0.63 12/11/2018   BUN 14 12/11/2018   CO2 27 12/11/2018   TSH 0.55 08/02/2016   INR 0.98 03/24/2011    Lab Results  Component Value Date   TSH 0.55 08/02/2016   Lab Results  Component Value Date   WBC 5.9 12/11/2018   HGB 13.0 12/11/2018   HCT 40.4 12/11/2018   MCV 75.2 (L) 12/11/2018   PLT 273 12/11/2018   Lab Results  Component Value Date   NA 139 12/11/2018   K 3.6 12/11/2018   CO2 27 12/11/2018   GLUCOSE 92 12/11/2018   BUN 14 12/11/2018   CREATININE 0.63 12/11/2018   BILITOT 0.7 12/11/2018   ALKPHOS 69 12/11/2018   AST 21 12/11/2018   ALT 20 12/11/2018   PROT 7.6 12/11/2018   ALBUMIN 4.6 12/11/2018   CALCIUM 9.3 12/11/2018   ANIONGAP 8 12/11/2018   GFR 80.57 05/18/2017   Lab Results  Component Value Date   CHOL 191 01/19/2010   Lab Results  Component Value Date   HDL 58 01/19/2010   Lab Results  Component Value Date   LDLCALC 124 (H) 01/19/2010   Lab Results  Component Value Date   TRIG 46  01/19/2010   Lab Results  Component Value Date   CHOLHDL  3.3 Ratio 01/19/2010   No results found for: HGBA1C     Assessment & Plan:   Problem List Items Addressed This Visit       Unprioritized   COVID-19 virus infection - Primary    New. No recent Cr available. Will initiate molnupiravir 800mg  PO BID x 5 days.  Advised of CDC guidelines for self isolation/ ending isolation.  Advised of safe practice guidelines. Symptom Tier reviewed.  Encouraged to monitor for any worsening symptoms; watch for increased shortness of breath, weakness, and signs of dehydration. Advised when to seek emergency care.  Instructed to rest and hydrate well.  Advised to leave the house during recommended isolation period, only if it is necessary to seek medical care        Relevant Medications   Molnupiravir 200 MG CAPS    I am having [place] start on Molnupiravir. I am also having her maintain her amLODipine.  Meds ordered this encounter  Medications   Molnupiravir 200 MG CAPS    Sig: Take 4 capsules (800 mg total) by mouth in the morning and at bedtime for 5 days.    Dispense:  40 capsule    Refill:  0    Order Specific Question:   Supervising Provider    Answer:   Aram Beecham A [[id number]]    I discussed the assessment and treatment plan with the patient. The patient was provided an opportunity to ask questions and all were answered. The patient agreed with the plan and demonstrated an understanding of the instructions.   The patient was advised to call back or seek an in-person evaluation if the symptoms worsen or if the condition fails to improve as anticipated.  Danise Edge, NP Lemont Fillers at [facility] [phone] (phone) [phone] (fax)  [facility]

## 2021-05-24 NOTE — Patient Instructions (Signed)
Please begin molnupiravir 800mg  twice daily for COVID-19.

## 2021-06-01 ENCOUNTER — Other Ambulatory Visit (HOSPITAL_BASED_OUTPATIENT_CLINIC_OR_DEPARTMENT_OTHER): Payer: Self-pay

## 2021-06-22 ENCOUNTER — Encounter: Payer: BC Managed Care – PPO | Admitting: Family

## 2021-06-22 NOTE — Progress Notes (Incomplete)
Subjective:   By signing my name below, I, Shehryar Baig, attest that this documentation has been prepared under the direction and in the presence of Sandford Craze NP. 06/22/2021    Patient ID: [name], female    DOB: September 06, 1961, 60 y.o.   MRN: [id number]  No chief complaint on file.   HPI Patient is in today for a comprehensive physical exam.  Patient presents today for complete physical. She denies having any unexpected weight change, hearing loss and rhinorrhea, visual disturbance, cough, chest pain and leg swelling, nausea, vomiting, diarrhea and blood in stool, or dysuria and frequency, for myalgias and arthralgias, rash, headaches, adenopathy, depression or anxiety at this time.   Immunizations: Diet: Exercise: Colonoscopy: Last done on 12/29/2014. Results were normal.  Dexa: Last done on 04/24/2019. Results showed that she is osteopenic according to the [employer]. Repeat in 2 years. Mammogram: Last done on 03/30/2021. Results were normal. Repeat in 1 year. Dental: Vision:  Health Maintenance Due  Topic Date Due   HIV Screening  Never done   Hepatitis C Screening  Never done   Zoster Vaccines- Shingrix (1 of 2) Never done    Past Medical History:  Diagnosis Date   Anemia    Anxiety    Depression    GERD (gastroesophageal reflux disease)    H. pylori infection    Hx of cardiovascular stress test    Myoview 6/12:  diaph atten, no ischemia, low risk   Hx of echocardiogram    Echo 6/12:  EF 50-55%, mild diast dysfn, mild LAE, mild MR   Hypertension    Irregular heart rhythm    unclear what arrhythmia   Migraine    Osteopenia 04/25/2019   Pneumothorax    pneumonia   Supraventricular tachycardia, paroxysmal (HCC)    a. visit to EF 03/2011;  b. Event monitor (@[facility] ) 6/12:  PVCs/PACs, 3 episodes of WCT (SVT with aberrancy)   Torus palatinus     Past Surgical History:  Procedure Laterality Date   ABDOMINAL HYSTERECTOMY  2005    CESAREAN SECTION     x 2   PLEURAL SCARIFICATION      Family History  Problem Relation Age of Onset   Depression Mother    Diabetes Mother    Heart disease Mother    Hypertension Mother    Arthritis Mother    Mental illness Mother    Hypertension Father    Coronary artery disease Father    Heart disease Father    Heart failure Brother    Heart disease Brother    Hypertension Brother        x2   Hypertension Sister    Stomach cancer Neg Hx     Social History   Socioeconomic History   Marital status: Divorced    Spouse name: Not on file   Number of children: 2   Years of education: Not on file   Highest education level: Not on file  Occupational History   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    Employer: [employer]  Tobacco Use   Smoking status: Never   Smokeless tobacco: Never  Substance and Sexual Activity   Alcohol use: No   Drug use: No   Sexual activity: Not on file  Other Topics Concern   Not on file  Social History Narrative   Not on file   Social Determinants of Health   Financial Resource Strain: Not on file  Food Insecurity: Not on  file  Transportation Needs: Not on file  Physical Activity: Not on file  Stress: Not on file  Social Connections: Not on file  Intimate Partner Violence: Not on file    Outpatient Medications Prior to Visit  Medication Sig Dispense Refill   amLODipine (NORVASC) 5 MG tablet TAKE 1 TABLET (5 MG TOTAL) BY MOUTH DAILY. 30 tablet 3   No facility-administered medications prior to visit.    No Known Allergies  Review of Systems  Constitutional:  Negative for fever.       (-)unexpected weight changes (-)adenopathy  HENT:  Negative for hearing loss.        (-)rhinorrhea  Eyes:        (-)vision changes  Respiratory:  Negative for cough.   Cardiovascular:  Negative for chest pain and leg swelling.  Gastrointestinal:  Negative for blood in stool, diarrhea, nausea and vomiting.  Genitourinary:   Negative for dysuria and frequency.  Musculoskeletal:  Negative for joint pain and myalgias.  Neurological:  Negative for headaches.  Psychiatric/Behavioral:  Negative for depression. The patient is not nervous/anxious.       Objective:    Physical Exam Constitutional:      General: She is not in acute distress.    Appearance: Normal appearance. She is not ill-appearing.  HENT:     Head: Normocephalic and atraumatic.     Right Ear: Tympanic membrane, ear canal and external ear normal.     Left Ear: Tympanic membrane, ear canal and external ear normal.  Eyes:     Extraocular Movements: Extraocular movements intact.     Pupils: Pupils are equal, round, and reactive to light.     Comments: (-)nystagmus  Lymphadenopathy:     Cervical: No cervical adenopathy.  Neurological:     Mental Status: She is alert.    There were no vitals taken for this visit. Wt Readings from Last 3 Encounters:  03/23/21 158 lb (71.7 kg)  02/16/21 160 lb (72.6 kg)  01/19/21 159 lb (72.1 kg)       Assessment & Plan:   Problem List Items Addressed This Visit   None   No orders of the defined types were placed in this encounter.   I, Sandford Craze NP.  , personally preformed the services described in this documentation.  All medical record entries made by the scribe were at my direction and in my presence.  I have reviewed the chart and discharge instructions (if applicable) and agree that the record reflects my personal performance and is accurate and complete. 06/22/2021   I,Shehryar Baig,acting as a Neurosurgeon for Lemont Fillers, NP.,have documented all relevant documentation on the behalf of Lemont Fillers, NP,as directed by  Lemont Fillers, NP while in the presence of Lemont Fillers, NP.      Shehryar [employer]

## 2021-07-01 ENCOUNTER — Other Ambulatory Visit: Payer: Self-pay | Admitting: Family

## 2021-07-01 ENCOUNTER — Other Ambulatory Visit (HOSPITAL_BASED_OUTPATIENT_CLINIC_OR_DEPARTMENT_OTHER): Payer: Self-pay

## 2021-07-01 MED ORDER — AMLODIPINE BESYLATE 5 MG PO TABS
ORAL_TABLET | Freq: Every day | ORAL | 3 refills | Status: DC
  Filled 2021-07-01: qty 30, 30d supply, fill #0
  Filled 2021-08-04: qty 30, 30d supply, fill #1
  Filled 2021-09-09: qty 30, 30d supply, fill #2
  Filled 2021-10-13: qty 30, 30d supply, fill #3

## 2021-07-18 ENCOUNTER — Other Ambulatory Visit: Payer: Self-pay

## 2021-07-18 ENCOUNTER — Ambulatory Visit: Payer: BC Managed Care – PPO | Admitting: Family

## 2021-07-18 VITALS — BP 133/84 | HR 66 | Temp 98.5°F | Resp 16 | Ht 70.0 in | Wt 158.0 lb

## 2021-07-18 DIAGNOSIS — M722 Plantar fascial fibromatosis: Secondary | ICD-10-CM | POA: Diagnosis not present

## 2021-07-18 NOTE — Assessment & Plan Note (Addendum)
Recommended ibuprofen PRN, icing, pt given printout of exercises as well. Discussed need to wear good supportive shoes. She is advised to call if symptoms worsen or if symptoms are not improved in 2-3 weeks.

## 2021-07-18 NOTE — Patient Instructions (Signed)
Please continue ibuprofen as needed

## 2021-07-18 NOTE — Progress Notes (Signed)
Subjective:   By signing my name below, I, Shehryar Baig, attest that this documentation has been prepared under the direction and in the presence of Sandford Craze NP. 07/18/2021    Patient ID: [name], female    DOB: Sep 01, 1961, 60 y.o.   MRN: [id number]  No chief complaint on file.   HPI Patient is in today for a office visit.  Foot pain- She complains of occasional right foot pain on the bottom of her right foot. She reports yesterday while working she had a episode of sharp pain in the bottom of her feet while working. She stands on her feet most of the the work day. She takes ibuprofen to manage her pain and finds mild relief.   Health Maintenance Due  Topic Date Due   HIV Screening  Never done   Hepatitis C Screening  Never done   Zoster Vaccines- Shingrix (1 of 2) Never done   INFLUENZA VACCINE  07/04/2021    Past Medical History:  Diagnosis Date   Anemia    Anxiety    Depression    GERD (gastroesophageal reflux disease)    H. pylori infection    Hx of cardiovascular stress test    Myoview 6/12:  diaph atten, no ischemia, low risk   Hx of echocardiogram    Echo 6/12:  EF 50-55%, mild diast dysfn, mild LAE, mild MR   Hypertension    Irregular heart rhythm    unclear what arrhythmia   Migraine    Osteopenia 04/25/2019   Pneumothorax    pneumonia   Supraventricular tachycardia, paroxysmal ([facility])    a. visit to EF 03/2011;  b. Event monitor (@[facility] ) 6/12:  PVCs/PACs, 3 episodes of WCT (SVT with aberrancy)   Torus palatinus     Past Surgical History:  Procedure Laterality Date   ABDOMINAL HYSTERECTOMY  2005   CESAREAN SECTION     x 2   PLEURAL SCARIFICATION      Family History  Problem Relation Age of Onset   Depression Mother    Diabetes Mother    Heart disease Mother    Hypertension Mother    Arthritis Mother    Mental illness Mother    Hypertension Father    Coronary artery disease Father    Heart disease Father    Heart failure Brother     Heart disease Brother    Hypertension Brother        x2   Hypertension Sister    Stomach cancer Neg Hx     Social History   Socioeconomic History   Marital status: Divorced    Spouse name: Not on file   Number of children: 2   Years of education: Not on file   Highest education level: Not on file  Occupational History   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    Employer: [employer]  Tobacco Use   Smoking status: Never   Smokeless tobacco: Never  Substance and Sexual Activity   Alcohol use: No   Drug use: No   Sexual activity: Not on file  Other Topics Concern   Not on file  Social History Narrative   Not on file   Social Determinants of Health   Financial Resource Strain: Not on file  Food Insecurity: Not on file  Transportation Needs: Not on file  Physical Activity: Not on file  Stress: Not on file  Social Connections: Not on file  Intimate Partner Violence: Not on file  Outpatient Medications Prior to Visit  Medication Sig Dispense Refill   amLODipine (NORVASC) 5 MG tablet TAKE 1 TABLET (5 MG TOTAL) BY MOUTH DAILY. 30 tablet 3   No facility-administered medications prior to visit.    No Known Allergies  Review of Systems  Musculoskeletal:  Positive for myalgias (right foot plantar pain).      Objective:    Physical Exam Constitutional:      General: She is not in acute distress.    Appearance: Normal appearance. She is not ill-appearing.  HENT:     Head: Normocephalic and atraumatic.     Right Ear: External ear normal.     Left Ear: External ear normal.  Eyes:     Extraocular Movements: Extraocular movements intact.     Pupils: Pupils are equal, round, and reactive to light.  Neurological:     Mental Status: She is alert and oriented to person, place, and time.  Psychiatric:        Behavior: Behavior normal.    BP 133/84 (BP Location: Right Arm, Patient Position: Sitting, Cuff Size: Small)   Pulse 66   Temp 98.5 F  (36.9 C) (Oral)   Resp 16   Ht 5\' 10"  (1.778 m)   Wt 158 lb (71.7 kg)   SpO2 100%   BMI 22.67 kg/m  Wt Readings from Last 3 Encounters:  07/18/21 158 lb (71.7 kg)  03/23/21 158 lb (71.7 kg)  02/16/21 160 lb (72.6 kg)       Assessment & Plan:   Problem List Items Addressed This Visit       Unprioritized   Plantar fasciitis - Primary    Recommended ibuprofen PRN, icing, pt given printout of exercises as well. Discussed need to wear good supportive shoes. She is advised to call if symptoms worsen or if symptoms are not improved in 2-3 weeks.         No orders of the defined types were placed in this encounter.   I, 02/18/21 NP, personally preformed the services described in this documentation.  All medical record entries made by the scribe were at my direction and in my presence.  I have reviewed the chart and discharge instructions (if applicable) and agree that the record reflects my personal performance and is accurate and complete. 07/18/2021   I,Shehryar Baig,acting as a 07/20/2021 for Neurosurgeon, NP.,have documented all relevant documentation on the behalf of Lemont Fillers, NP,as directed by  Lemont Fillers, NP while in the presence of Lemont Fillers, NP.   Lemont Fillers, NP

## 2021-08-04 ENCOUNTER — Other Ambulatory Visit (HOSPITAL_BASED_OUTPATIENT_CLINIC_OR_DEPARTMENT_OTHER): Payer: Self-pay

## 2021-09-09 ENCOUNTER — Other Ambulatory Visit (HOSPITAL_BASED_OUTPATIENT_CLINIC_OR_DEPARTMENT_OTHER): Payer: Self-pay

## 2021-09-22 ENCOUNTER — Other Ambulatory Visit (HOSPITAL_BASED_OUTPATIENT_CLINIC_OR_DEPARTMENT_OTHER): Payer: Self-pay

## 2021-09-22 ENCOUNTER — Other Ambulatory Visit: Payer: Self-pay

## 2021-09-22 ENCOUNTER — Ambulatory Visit (INDEPENDENT_AMBULATORY_CARE_PROVIDER_SITE_OTHER): Payer: BC Managed Care – PPO

## 2021-09-22 ENCOUNTER — Ambulatory Visit: Payer: BC Managed Care – PPO | Attending: Internal Medicine

## 2021-09-22 DIAGNOSIS — Z23 Encounter for immunization: Secondary | ICD-10-CM

## 2021-09-22 MED ORDER — INFLUENZA VAC SPLIT QUAD 0.5 ML IM SUSY
PREFILLED_SYRINGE | INTRAMUSCULAR | 0 refills | Status: DC
  Filled 2021-09-22: qty 0.5, 1d supply, fill #0

## 2021-09-22 NOTE — Progress Notes (Signed)
   Covid-19 Vaccination Clinic  Name:  [name]    MRN: [id number] DOB: 03-Apr-1961  09/22/2021  [name] was observed post Covid-19 immunization for 15 minutes without incident. She was provided with Vaccine Information Sheet and instruction to access the V-Safe system.   [name] was instructed to call 911 with any severe reactions post vaccine: Difficulty breathing  Swelling of face and throat  A fast heartbeat  A bad rash all over body  Dizziness and weakness   Immunizations Administered     Name Date Dose VIS Date Route   Pfizer Covid-19 Vaccine Bivalent Booster 09/22/2021  2:19 PM 0.3 mL 08/03/2021 Intramuscular   Manufacturer: [employer], [employer]   Lot: [id number]   NDC: [phone]

## 2021-10-13 ENCOUNTER — Other Ambulatory Visit (HOSPITAL_BASED_OUTPATIENT_CLINIC_OR_DEPARTMENT_OTHER): Payer: Self-pay

## 2021-10-18 ENCOUNTER — Other Ambulatory Visit (HOSPITAL_BASED_OUTPATIENT_CLINIC_OR_DEPARTMENT_OTHER): Payer: Self-pay

## 2021-10-18 MED ORDER — PFIZER COVID-19 VAC BIVALENT 30 MCG/0.3ML IM SUSP
INTRAMUSCULAR | 0 refills | Status: DC
  Filled 2021-10-18: qty 0.3, 1d supply, fill #0

## 2021-11-18 ENCOUNTER — Other Ambulatory Visit: Payer: Self-pay | Admitting: Family

## 2021-11-18 ENCOUNTER — Other Ambulatory Visit (HOSPITAL_BASED_OUTPATIENT_CLINIC_OR_DEPARTMENT_OTHER): Payer: Self-pay

## 2021-11-18 MED ORDER — AMLODIPINE BESYLATE 5 MG PO TABS
ORAL_TABLET | Freq: Every day | ORAL | 0 refills | Status: DC
  Filled 2021-11-18: qty 90, 90d supply, fill #0

## 2022-02-22 ENCOUNTER — Other Ambulatory Visit (HOSPITAL_BASED_OUTPATIENT_CLINIC_OR_DEPARTMENT_OTHER): Payer: Self-pay

## 2022-02-22 ENCOUNTER — Ambulatory Visit: Payer: BC Managed Care – PPO | Admitting: Family

## 2022-02-22 ENCOUNTER — Encounter: Payer: Self-pay | Admitting: Family

## 2022-02-22 VITALS — BP 156/105 | HR 80 | Temp 98.2°F | Resp 18 | Ht 70.0 in | Wt 156.0 lb

## 2022-02-22 DIAGNOSIS — I10 Essential (primary) hypertension: Secondary | ICD-10-CM

## 2022-02-22 DIAGNOSIS — M8580 Other specified disorders of bone density and structure, unspecified site: Secondary | ICD-10-CM

## 2022-02-22 DIAGNOSIS — R8290 Unspecified abnormal findings in urine: Secondary | ICD-10-CM | POA: Diagnosis not present

## 2022-02-22 DIAGNOSIS — R918 Other nonspecific abnormal finding of lung field: Secondary | ICD-10-CM

## 2022-02-22 DIAGNOSIS — Z833 Family history of diabetes mellitus: Secondary | ICD-10-CM

## 2022-02-22 DIAGNOSIS — M25561 Pain in right knee: Secondary | ICD-10-CM | POA: Diagnosis not present

## 2022-02-22 DIAGNOSIS — F411 Generalized anxiety disorder: Secondary | ICD-10-CM

## 2022-02-22 DIAGNOSIS — R202 Paresthesia of skin: Secondary | ICD-10-CM | POA: Diagnosis not present

## 2022-02-22 LAB — COMPREHENSIVE METABOLIC PANEL
ALT: 17 U/L (ref 0–35)
AST: 17 U/L (ref 0–37)
Albumin: 4.5 g/dL (ref 3.5–5.2)
Alkaline Phosphatase: 71 U/L (ref 39–117)
BUN: 21 mg/dL (ref 6–23)
CO2: 29 mEq/L (ref 19–32)
Calcium: 9.7 mg/dL (ref 8.4–10.5)
Chloride: 105 mEq/L (ref 96–112)
Creatinine, Ser: 0.68 mg/dL (ref 0.40–1.20)
GFR: 92.97 mL/min (ref >60.00)
Glucose, Bld: 89 mg/dL (ref 70–99)
Potassium: 4 mEq/L (ref 3.5–5.1)
Sodium: 142 mEq/L (ref 135–145)
Total Bilirubin: 0.5 mg/dL (ref 0.2–1.2)
Total Protein: 7.1 g/dL (ref 6.0–8.3)

## 2022-02-22 LAB — POCT URINALYSIS DIPSTICK
Bilirubin, UA: NEGATIVE
Blood, UA: POSITIVE
Glucose, UA: NEGATIVE
Ketones, UA: NEGATIVE
Leukocytes, UA: NEGATIVE
Nitrite, UA: NEGATIVE
Protein, UA: NEGATIVE
Spec Grav, UA: <=1.005 — AB (ref 1.010–1.025)
Urobilinogen, UA: 0.2 E.U./dL
pH, UA: 6 (ref 5.0–8.0)

## 2022-02-22 LAB — FOLATE: Folate: 9.9 ng/mL (ref >5.9)

## 2022-02-22 LAB — HEMOGLOBIN A1C: Hgb A1c MFr Bld: 5.9 % (ref 4.6–6.5)

## 2022-02-22 LAB — VITAMIN B12: Vitamin B-12: 635 pg/mL (ref 211–911)

## 2022-02-22 MED ORDER — AMLODIPINE BESYLATE 10 MG PO TABS
10.0000 mg | ORAL_TABLET | Freq: Every day | ORAL | 1 refills | Status: DC
  Filled 2022-02-22: qty 90, 90d supply, fill #0
  Filled 2022-06-01: qty 90, 90d supply, fill #1

## 2022-02-22 NOTE — Assessment & Plan Note (Signed)
Uncontrolled. Repeat bp 162/111. Will increase amlodipine from 5mg  to 10mg .  ?

## 2022-02-22 NOTE — Patient Instructions (Signed)
Please complete lab work prior to leaving. Increase amlodipine to 10mg once daily. 

## 2022-02-22 NOTE — Progress Notes (Signed)
? ?Subjective:  ? ? ? Patient ID: [name], female    DOB: 1961/05/13, 61 y.o.   MRN: [id number] ? ?No chief complaint on file. ? ? ?HPI ?Patient is in today for follow up. ? ?Has had some intermittent pain in her right knee.  She reports that she could not weight bear on the right knee.  Had some pain radiating down her anterior thigh.  She reports some sharp needle pains in both feet.   ? ?Also concerned that she may have a UTI.  She has had some mild bladder discomfort. Also has a strong odor in her urine.  ? ?HTN- Continues amlodipine. Notes siome higher blood pressure in the AM's sometimes.   ?BP Readings from Last 3 Encounters:  ?02/22/22 (!) 156/105  ?07/18/21 133/84  ?03/23/21 131/88  ? ?She reports that she had an anxiety attack with driving about 6 months ago.  Intermittent mild anxiety symptoms, but she does not want to take medication.  ? ?Health Maintenance Due  ?Topic Date Due  ? HIV Screening  Never done  ? Hepatitis C Screening  Never done  ? Zoster Vaccines- Shingrix (1 of 2) Never done  ? COVID-19 Vaccine (2 - Pfizer series) 10/13/2021  ? ? ?Past Medical History:  ?Diagnosis Date  ? Anemia   ? Anxiety   ? COVID-19 virus infection 05/24/2021  ? Depression   ? GERD (gastroesophageal reflux disease)   ? H. pylori infection   ? Hx of cardiovascular stress test   ? Myoview 6/12:  diaph atten, no ischemia, low risk  ? Hx of echocardiogram   ? Echo 6/12:  EF 50-55%, mild diast dysfn, mild LAE, mild MR  ? Hypertension   ? Irregular heart rhythm   ? unclear what arrhythmia  ? Migraine   ? Osteopenia 04/25/2019  ? Pneumothorax   ? pneumonia  ? Supraventricular tachycardia, paroxysmal ([place])   ? a. visit to EF 03/2011;  b. Event monitor (@[facility] ) 6/12:  PVCs/PACs, 3 episodes of WCT (SVT with aberrancy)  ? Torus palatinus   ? ? ?Past Surgical History:  ?Procedure Laterality Date  ? ABDOMINAL HYSTERECTOMY  2005  ? CESAREAN SECTION    ? x 2  ? PLEURAL SCARIFICATION    ? ? ?Family History  ?Problem Relation Age of  Onset  ? Depression Mother   ? Diabetes Mother   ? Heart disease Mother   ? Hypertension Mother   ? Arthritis Mother   ? Mental illness Mother   ? Hypertension Father   ? Coronary artery disease Father   ? Heart disease Father   ? Heart failure Brother   ? Heart disease Brother   ? Hypertension Brother   ?     x2  ? Hypertension Sister   ? Stomach cancer Neg Hx   ? ? ?Social History  ? ?Socioeconomic History  ? Marital status: Divorced  ?  Spouse name: Not on file  ? Number of children: 2  ? Years of education: Not on file  ? Highest education level: Not on file  ?Occupational History  ? Occupation: Programmer, applications office support  ?  Employer: [employer]  ?Tobacco Use  ? Smoking status: Never  ? Smokeless tobacco: Never  ?Substance and Sexual Activity  ? Alcohol use: No  ? Drug use: No  ? Sexual activity: Not on file  ?Other Topics Concern  ? Not on file  ?Social History Narrative  ? Not on file  ? ?  Social Determinants of Health  ? ?Financial Resource Strain: Not on file  ?Food Insecurity: Not on file  ?Transportation Needs: Not on file  ?Physical Activity: Not on file  ?Stress: Not on file  ?Social Connections: Not on file  ?Intimate Partner Violence: Not on file  ? ? ?Outpatient Medications Prior to Visit  ?Medication Sig Dispense Refill  ? amLODipine (NORVASC) 5 MG tablet TAKE 1 TABLET (5 MG TOTAL) BY MOUTH DAILY. 90 tablet 0  ? COVID-19 mRNA bivalent vaccine, Pfizer, (PFIZER COVID-19 VAC BIVALENT) injection Inject into the muscle. 0.3 mL 0  ? ?No facility-administered medications prior to visit.  ? ? ?No Known Allergies ? ?ROS ?See HPI ?   ?Objective:  ?  ?Physical Exam ?Constitutional:   ?   General: She is not in acute distress. ?   Appearance: Normal appearance. She is well-developed.  ?HENT:  ?   Head: Normocephalic and atraumatic.  ?   Right Ear: External ear normal.  ?   Left Ear: External ear normal.  ?Eyes:  ?   General: No scleral icterus. ?Neck:  ?   Thyroid: No thyromegaly.   ?Cardiovascular:  ?   Rate and Rhythm: Normal rate and regular rhythm.  ?   Heart sounds: Normal heart sounds. No murmur heard. ?Pulmonary:  ?   Effort: Pulmonary effort is normal. No respiratory distress.  ?   Breath sounds: Normal breath sounds. No wheezing.  ?Musculoskeletal:     ?   General: No swelling.  ?   Cervical back: Neck supple.  ?Skin: ?   General: Skin is warm and dry.  ?Neurological:  ?   Mental Status: She is alert and oriented to person, place, and time.  ?Psychiatric:     ?   Mood and Affect: Mood normal.     ?   Behavior: Behavior normal.     ?   Thought Content: Thought content normal.     ?   Judgment: Judgment normal.  ? ? ?BP (!) 156/105   Pulse 80   Temp 98.2 ?F (36.8 ?C)   Resp 18   Ht 5' 10"  (1.778 m)   Wt 156 lb (70.8 kg)   SpO2 100%   BMI 22.38 kg/m?  ?Wt Readings from Last 3 Encounters:  ?02/22/22 156 lb (70.8 kg)  ?07/18/21 158 lb (71.7 kg)  ?03/23/21 158 lb (71.7 kg)  ? ? ?   ?Assessment & Plan:  ? ?Problem List Items Addressed This Visit   ? ?  ? Unprioritized  ? Right knee pain  ?  New. Intermittent. No swelling. Recommended prn ibuprofen.  ?  ?  ? RESOLVED: Pulmonary nodules  ? Paresthesia  ?  Noted nerve like pain in both feet sometimes. Could be radicular in nature from the lumbar spine. Will obtain b12/folate to rule out deficiency. ?  ?  ? Relevant Orders  ? B12  ? Folate  ? Osteopenia  ? Relevant Orders  ? DG Bone Density  ? Family history of diabetes mellitus - Primary  ?   ?Will check A1C. Sister recently diagnosed as "pre-diabetic." ?  ?  ? Relevant Orders  ? Comp Met (CMET)  ? Hemoglobin A1c  ? Essential hypertension  ?  Uncontrolled. Repeat bp 162/111. Will increase amlodipine from 38m to 134m  ?  ?  ? Relevant Medications  ? amLODipine (NORVASC) 10 MG tablet  ? Anxiety state  ?  Fair control without medication. Monitor.  ?  ?  ? ?  Other Visit Diagnoses   ? ? Abnormal urine odor      ? Relevant Orders  ? Urine Culture  ? ?  ? ? ?I have discontinued [employer] COVID-19 Vac Bivalent and amLODipine. I am also having her start on amLODipine. ? ?Meds ordered this encounter  ?Medications  ? amLODipine (NORVASC) 10 MG tablet  ?  Sig: Take 1 tablet (10 mg total) by mouth daily.  ?  Dispense:  90 tablet  ?  Refill:  1  ?  Order Specific Question:   Supervising Provider  ?  Answer:   Penni Homans A [[id number]]  ? ?

## 2022-02-22 NOTE — Assessment & Plan Note (Addendum)
New. Intermittent. No swelling. Recommended prn ibuprofen.  ?

## 2022-02-22 NOTE — Assessment & Plan Note (Signed)
Fair control without medication. Monitor.  ?

## 2022-02-22 NOTE — Assessment & Plan Note (Signed)
Noted nerve like pain in both feet sometimes. Could be radicular in nature from the lumbar spine. Will obtain b12/folate to rule out deficiency. ?

## 2022-02-22 NOTE — Assessment & Plan Note (Signed)
?  Will check A1C. Sister recently diagnosed as "pre-diabetic." ?

## 2022-02-23 LAB — URINE CULTURE
MICRO NUMBER:: 13164689
Result:: NO GROWTH
SPECIMEN QUALITY:: ADEQUATE

## 2022-03-01 ENCOUNTER — Ambulatory Visit (HOSPITAL_BASED_OUTPATIENT_CLINIC_OR_DEPARTMENT_OTHER): Payer: BC Managed Care – PPO

## 2022-03-09 ENCOUNTER — Ambulatory Visit (HOSPITAL_BASED_OUTPATIENT_CLINIC_OR_DEPARTMENT_OTHER)
Admission: RE | Admit: 2022-03-09 | Discharge: 2022-03-09 | Disposition: A | Discharge status: Home / Self Care | Payer: BC Managed Care – PPO | Source: Ambulatory Visit | Attending: Family | Admitting: Family

## 2022-03-09 ENCOUNTER — Other Ambulatory Visit (HOSPITAL_BASED_OUTPATIENT_CLINIC_OR_DEPARTMENT_OTHER): Payer: Self-pay | Admitting: Family

## 2022-03-09 DIAGNOSIS — Z1231 Encounter for screening mammogram for malignant neoplasm of breast: Secondary | ICD-10-CM

## 2022-03-09 DIAGNOSIS — Z780 Asymptomatic menopausal state: Secondary | ICD-10-CM | POA: Diagnosis not present

## 2022-03-09 DIAGNOSIS — M8580 Other specified disorders of bone density and structure, unspecified site: Secondary | ICD-10-CM | POA: Insufficient documentation

## 2022-03-09 DIAGNOSIS — M8589 Other specified disorders of bone density and structure, multiple sites: Secondary | ICD-10-CM | POA: Diagnosis not present

## 2022-03-22 ENCOUNTER — Ambulatory Visit: Payer: BC Managed Care – PPO | Admitting: Family

## 2022-03-22 NOTE — Progress Notes (Incomplete)
? ?Subjective:  ? ?By signing my name below, I, [name], attest that this documentation has been prepared under the direction and in the presence of Renee Alar, NP 03/22/2022 ? ? Patient ID: [name], female    DOB: 04-07-1961, 61 y.o.   MRN: [id number] ? ?No chief complaint on file. ? ? ?HPI ?Patient is in today for an office visit and 4 week f/u ? ?Right knee pain- ?UTI- ?Hypertension- ? ?Past Medical History:  ?Diagnosis Date  ? Anemia   ? Anxiety   ? COVID-19 virus infection 05/24/2021  ? Depression   ? GERD (gastroesophageal reflux disease)   ? H. pylori infection   ? Hx of cardiovascular stress test   ? Myoview 6/12:  diaph atten, no ischemia, low risk  ? Hx of echocardiogram   ? Echo 6/12:  EF 50-55%, mild diast dysfn, mild LAE, mild MR  ? Hypertension   ? Irregular heart rhythm   ? unclear what arrhythmia  ? Migraine   ? Osteopenia 04/25/2019  ? Pneumothorax   ? pneumonia  ? Supraventricular tachycardia, paroxysmal ([place])   ? a. visit to EF 03/2011;  b. Event monitor (@[facility] ) 6/12:  PVCs/PACs, 3 episodes of WCT (SVT with aberrancy)  ? Torus palatinus   ? ? ?Past Surgical History:  ?Procedure Laterality Date  ? ABDOMINAL HYSTERECTOMY  2005  ? CESAREAN SECTION    ? x 2  ? PLEURAL SCARIFICATION    ? ? ?Family History  ?Problem Relation Age of Onset  ? Depression Mother   ? Diabetes Mother   ? Heart disease Mother   ? Hypertension Mother   ? Arthritis Mother   ? Mental illness Mother   ? Hypertension Father   ? Coronary artery disease Father   ? Heart disease Father   ? Heart failure Brother   ? Heart disease Brother   ? Hypertension Brother   ?     x2  ? Hypertension Sister   ? Stomach cancer Neg Hx   ? ? ?Social History  ? ?Socioeconomic History  ? Marital status: Divorced  ?  Spouse name: Not on file  ? Number of children: 2  ? Years of education: Not on file  ? Highest education level: Not on file  ?Occupational History  ? Occupation: Programmer, applications office support  ?  Employer: [employer]  ?Tobacco Use  ? Smoking status: Never  ? Smokeless tobacco: Never  ?Substance and Sexual Activity  ? Alcohol use: No  ? Drug use: No  ? Sexual activity: Not on file  ?Other Topics Concern  ? Not on file  ?Social History Narrative  ? Not on file  ? ?Social Determinants of Health  ? ?Financial Resource Strain: Not on file  ?Food Insecurity: Not on file  ?Transportation Needs: Not on file  ?Physical Activity: Not on file  ?Stress: Not on file  ?Social Connections: Not on file  ?Intimate Partner Violence: Not on file  ? ? ?Outpatient Medications Prior to Visit  ?Medication Sig Dispense Refill  ? amLODipine (NORVASC) 10 MG tablet Take 1 tablet (10 mg total) by mouth daily. 90 tablet 1  ? ?No facility-administered medications prior to visit.  ? ? ?No Known Allergies ? ?Review of Systems  ?Constitutional:  Negative for fever.  ?HENT:  Negative for ear pain and hearing loss.   ?     (-)nystagmus ?(-)adenopathy  ?Eyes:  Negative for blurred vision.  ?Respiratory:  Negative for cough, shortness  of breath and wheezing.   ?Cardiovascular:  Negative for chest pain and leg swelling.  ?Gastrointestinal:  Negative for blood in stool, diarrhea, nausea and vomiting.  ?Genitourinary:  Negative for dysuria and frequency.  ?Musculoskeletal:  Negative for joint pain and myalgias.  ?Skin:  Negative for rash.  ?Neurological:  Negative for headaches.  ?Psychiatric/Behavioral:  Negative for depression. The patient is not nervous/anxious.   ? ?   ?Objective:  ?  ?Physical Exam ?Constitutional:   ?   General: She is not in acute distress. ?   Appearance: Normal appearance. She is not ill-appearing.  ?HENT:  ?   Head: Normocephalic and atraumatic.  ?   Right Ear: External ear normal.  ?   Left Ear: External ear normal.  ?Eyes:  ?   Extraocular Movements: Extraocular movements intact.  ?   Pupils: Pupils are equal, round, and reactive to light.  ?Cardiovascular:  ?   Rate and Rhythm: Normal rate and regular rhythm.  ?   Pulses: Normal  pulses.  ?   Heart sounds: Normal heart sounds. No murmur heard. ?  No gallop.  ?Pulmonary:  ?   Effort: Pulmonary effort is normal. No respiratory distress.  ?   Breath sounds: Normal breath sounds. No wheezing, rhonchi or rales.  ?Abdominal:  ?   General: Bowel sounds are normal. There is no distension.  ?   Palpations: Abdomen is soft. There is no mass.  ?   Tenderness: There is no abdominal tenderness. There is no guarding or rebound.  ?   Hernia: No hernia is present.  ?Musculoskeletal:  ?   Cervical back: Normal range of motion and neck supple.  ?Lymphadenopathy:  ?   Cervical: No cervical adenopathy.  ?Skin: ?   General: Skin is warm and dry.  ?Neurological:  ?   Mental Status: She is alert and oriented to person, place, and time.  ?Psychiatric:     ?   Behavior: Behavior normal.     ?   Judgment: Judgment normal.  ? ? ?There were no vitals taken for this visit. ?Wt Readings from Last 3 Encounters:  ?02/22/22 156 lb (70.8 kg)  ?07/18/21 158 lb (71.7 kg)  ?03/23/21 158 lb (71.7 kg)  ? ? ?Diabetic Foot Exam - Simple   ?No data filed ?  ? ?Lab Results  ?Component Value Date  ? WBC 5.9 12/11/2018  ? HGB 13.0 12/11/2018  ? HCT 40.4 12/11/2018  ? PLT 273 12/11/2018  ? GLUCOSE 89 02/22/2022  ? CHOL 191 01/19/2010  ? TRIG 46 01/19/2010  ? HDL 58 01/19/2010  ? LDLCALC 124 (H) 01/19/2010  ? ALT 17 02/22/2022  ? AST 17 02/22/2022  ? NA 142 02/22/2022  ? K 4.0 02/22/2022  ? CL 105 02/22/2022  ? CREATININE 0.68 02/22/2022  ? BUN 21 02/22/2022  ? CO2 29 02/22/2022  ? TSH 0.55 08/02/2016  ? INR 0.98 03/24/2011  ? HGBA1C 5.9 02/22/2022  ? ? ?Lab Results  ?Component Value Date  ? TSH 0.55 08/02/2016  ? ?Lab Results  ?Component Value Date  ? WBC 5.9 12/11/2018  ? HGB 13.0 12/11/2018  ? HCT 40.4 12/11/2018  ? MCV 75.2 (L) 12/11/2018  ? PLT 273 12/11/2018  ? ?Lab Results  ?Component Value Date  ? NA 142 02/22/2022  ? K 4.0 02/22/2022  ? CO2 29 02/22/2022  ? GLUCOSE 89 02/22/2022  ? BUN 21 02/22/2022  ? CREATININE 0.68 02/22/2022   ? BILITOT 0.5 02/22/2022  ?  ALKPHOS 71 02/22/2022  ? AST 17 02/22/2022  ? ALT 17 02/22/2022  ? PROT 7.1 02/22/2022  ? ALBUMIN 4.5 02/22/2022  ? CALCIUM 9.7 02/22/2022  ? ANIONGAP 8 12/11/2018  ? GFR 92.97 02/22/2022  ? ?Lab Results  ?Component Value Date  ? CHOL 191 01/19/2010  ? ?Lab Results  ?Component Value Date  ? HDL 58 01/19/2010  ? ?Lab Results  ?Component Value Date  ? LDLCALC 124 (H) 01/19/2010  ? ?Lab Results  ?Component Value Date  ? TRIG 46 01/19/2010  ? ?Lab Results  ?Component Value Date  ? CHOLHDL 3.3 Ratio 01/19/2010  ? ?Lab Results  ?Component Value Date  ? HGBA1C 5.9 02/22/2022  ? ? ?   ?Assessment & Plan:  ? ?Problem List Items Addressed This Visit   ?None ? ? ?No orders of the defined types were placed in this encounter. ? ? ?I,[name],acting as a Education administrator for [employer], NP.,have documented all relevant documentation on the behalf of Nance Pear, NP,as directed by  Nance Pear, NP while in the presence of Nance Pear, NP.  ? ?I, Renee Alar, NP , personally preformed the services described in this documentation.  All medical record entries made by the scribe were at my direction and in my presence.  I have reviewed the chart and discharge instructions (if applicable) and agree that the record reflects my personal performance and is accurate and complete. 03/22/2022 ? ?

## 2022-04-05 ENCOUNTER — Ambulatory Visit (HOSPITAL_BASED_OUTPATIENT_CLINIC_OR_DEPARTMENT_OTHER)
Admission: RE | Admit: 2022-04-05 | Discharge: 2022-04-05 | Disposition: A | Discharge status: Home / Self Care | Payer: BC Managed Care – PPO | Source: Ambulatory Visit | Attending: Family | Admitting: Family

## 2022-04-05 ENCOUNTER — Encounter (HOSPITAL_BASED_OUTPATIENT_CLINIC_OR_DEPARTMENT_OTHER): Payer: Self-pay

## 2022-04-05 DIAGNOSIS — Z1231 Encounter for screening mammogram for malignant neoplasm of breast: Secondary | ICD-10-CM | POA: Insufficient documentation

## 2022-06-01 ENCOUNTER — Other Ambulatory Visit (HOSPITAL_BASED_OUTPATIENT_CLINIC_OR_DEPARTMENT_OTHER): Payer: Self-pay

## 2022-09-07 ENCOUNTER — Other Ambulatory Visit: Payer: Self-pay | Admitting: Family

## 2022-09-07 ENCOUNTER — Other Ambulatory Visit (HOSPITAL_BASED_OUTPATIENT_CLINIC_OR_DEPARTMENT_OTHER): Payer: Self-pay

## 2022-09-07 MED ORDER — AMLODIPINE BESYLATE 10 MG PO TABS
10.0000 mg | ORAL_TABLET | Freq: Every day | ORAL | 0 refills | Status: DC
  Filled 2022-09-07: qty 30, 30d supply, fill #0

## 2022-10-04 ENCOUNTER — Ambulatory Visit: Payer: BC Managed Care – PPO | Admitting: Family

## 2022-10-06 ENCOUNTER — Other Ambulatory Visit (HOSPITAL_BASED_OUTPATIENT_CLINIC_OR_DEPARTMENT_OTHER): Payer: Self-pay

## 2022-10-06 ENCOUNTER — Encounter: Payer: Self-pay | Admitting: Family

## 2022-10-06 ENCOUNTER — Ambulatory Visit: Payer: BC Managed Care – PPO | Admitting: Family

## 2022-10-06 VITALS — BP 98/74 | HR 85 | Temp 98.1°F | Ht 70.0 in | Wt 168.8 lb

## 2022-10-06 DIAGNOSIS — Z23 Encounter for immunization: Secondary | ICD-10-CM

## 2022-10-06 DIAGNOSIS — I10 Essential (primary) hypertension: Secondary | ICD-10-CM

## 2022-10-06 MED ORDER — AMLODIPINE BESYLATE 10 MG PO TABS
10.0000 mg | ORAL_TABLET | Freq: Every day | ORAL | 0 refills | Status: DC
  Filled 2022-10-06: qty 90, 90d supply, fill #0

## 2022-10-06 NOTE — Progress Notes (Addendum)
Subjective:   By signing my name below, I, [name], attest that this documentation has been prepared under the direction and in the presence of Renee Sons, NP 10/06/2022     Patient ID: [name], female    DOB: 1961-03-12, 61 y.o.   MRN: [id number]  Chief Complaint  Patient presents with   Medical Management of Chronic Issues    With med refills requesting 90 day supply     HPI Patient is in today for an office visit  Blood Pressure: Her blood pressure during today's visit is normal, but somewhat on the low side. She is currently taking 10 Mg of Amlodipine and reports that she is feeling "okay". She had some GI symptoms prior to today's appointment which she does not know could be related to her Amlodipine medication. She has not taken her medication prior to today's visit. She usually takes her medication around 11:00. Her blood pressure during today's visit is 98/74 BP Readings from Last 3 Encounters:  10/06/22 98/74  02/22/22 (!) 156/105  07/18/21 133/84   Pulse Readings from Last 3 Encounters:  10/06/22 85  02/22/22 80  07/18/21 66   Colonoscopy: Last completed on 12/29/2014  Mammogram: Last completed on 04/05/2022  Immunizations: She is interested in receiving an influenza vaccine during today's visit. She is overdue for her tetanus vaccine and is interested in receiving the vaccine during today's visit. She is not interested in a HIV/HepC screening. She is overdue to receive a Shingles vaccine  Health Maintenance Due  Topic Date Due   Zoster Vaccines- Shingrix (1 of 2) Never done    Past Medical History:  Diagnosis Date   Anemia    Anxiety    COVID-19 virus infection 05/24/2021   Depression    GERD (gastroesophageal reflux disease)    H. pylori infection    Hx of cardiovascular stress test    Myoview 6/12:  diaph atten, no ischemia, low risk   Hx of echocardiogram    Echo 6/12:  EF 50-55%, mild diast dysfn, mild LAE, mild MR    Hypertension    Irregular heart rhythm    unclear what arrhythmia   Migraine    Osteopenia 04/25/2019   Pneumothorax    pneumonia   Supraventricular tachycardia, paroxysmal    a. visit to EF 03/2011;  b. Event monitor (@[facility] ) 6/12:  PVCs/PACs, 3 episodes of WCT (SVT with aberrancy)   Torus palatinus     Past Surgical History:  Procedure Laterality Date   ABDOMINAL HYSTERECTOMY  2005   CESAREAN SECTION     x 2   PLEURAL SCARIFICATION      Family History  Problem Relation Age of Onset   Depression Mother    Diabetes Mother    Heart disease Mother    Hypertension Mother    Arthritis Mother    Mental illness Mother    Hypertension Father    Coronary artery disease Father    Heart disease Father    Heart failure Brother    Heart disease Brother    Hypertension Brother        x2   Hypertension Sister    Stomach cancer Neg Hx     Social History   Socioeconomic History   Marital status: Divorced    Spouse name: Not on file   Number of children: 2   Years of education: Not on file   Highest education level: Not on file  Occupational History   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    Employer: [employer]  Tobacco Use   Smoking status: Never   Smokeless tobacco: Never  Substance and Sexual Activity   Alcohol use: No   Drug use: No   Sexual activity: Not on file  Other Topics Concern   Not on file  Social History Narrative   Not on file   Social Determinants of Health   Financial Resource Strain: Not on file  Food Insecurity: Not on file  Transportation Needs: Not on file  Physical Activity: Not on file  Stress: Not on file  Social Connections: Not on file  Intimate Partner Violence: Not on file    Outpatient Medications Prior to Visit  Medication Sig Dispense Refill   amLODipine (NORVASC) 10 MG tablet Take 1 tablet (10 mg total) by mouth daily. 30 tablet 0   No facility-administered medications prior to visit.    No Known  Allergies  ROS See HPI    Objective:    Physical Exam Constitutional:      General: She is not in acute distress.    Appearance: Normal appearance. She is not ill-appearing.  HENT:     Head: Normocephalic and atraumatic.     Right Ear: External ear normal.     Left Ear: External ear normal.  Eyes:     Extraocular Movements: Extraocular movements intact.     Pupils: Pupils are equal, round, and reactive to light.  Cardiovascular:     Rate and Rhythm: Normal rate and regular rhythm.     Heart sounds: Normal heart sounds. No murmur heard.    No gallop.  Pulmonary:     Effort: Pulmonary effort is normal. No respiratory distress.     Breath sounds: Normal breath sounds. No wheezing or rales.  Skin:    General: Skin is warm and dry.  Neurological:     Mental Status: She is alert and oriented to person, place, and time.  Psychiatric:        Mood and Affect: Mood normal.        Behavior: Behavior normal.        Judgment: Judgment normal.     BP 98/74   Pulse 85   Temp 98.1 F (36.7 C) (Oral)   Ht 5\' 10"  (1.778 m)   Wt 168 lb 12.8 oz (76.6 kg)   SpO2 99%   BMI 24.22 kg/m  Wt Readings from Last 3 Encounters:  10/06/22 168 lb 12.8 oz (76.6 kg)  02/22/22 156 lb (70.8 kg)  07/18/21 158 lb (71.7 kg)       Assessment & Plan:   Problem List Items Addressed This Visit       Unprioritized   Essential hypertension - Primary    BP Readings from Last 3 Encounters:  10/06/22 98/74  02/22/22 (!) 156/105  07/18/21 133/84  BP a little low today.  Has had occasional mild dizziness recently. She has not yet taken her AM medication. Recommended the following:  Hold your amlodipine dose if top blood pressure number is <110. Check blood pressure once daily for the next few days and send via mychart.       Relevant Medications   amLODipine (NORVASC) 10 MG tablet   Other Visit Diagnoses     Need for immunization against influenza       Relevant Orders   Flu Vaccine QUAD  16mo+IM (Fluarix, Fluzone & Alfiuria Quad PF) (Completed)   Need for Tdap vaccination  Relevant Orders   Tdap vaccine greater than or equal to 7yo IM (Completed)      Meds ordered this encounter  Medications   amLODipine (NORVASC) 10 MG tablet    Sig: Take 1 tablet (10 mg total) by mouth daily.    Dispense:  90 tablet    Refill:  0    Requested drug refills are authorized, however, the patient needs further evaluation and/or laboratory testing before further refills are given. Ask her to make an appointment for this.    Order Specific Question:   Supervising Provider    Answer:   Penni Homans A [[id number]]   Flu shot and Tdap today. Consider Shingrix next visit.  I, Nance Pear, NP, personally preformed the services described in this documentation.  All medical record entries made by the scribe were at my direction and in my presence.  I have reviewed the chart and discharge instructions (if applicable) and agree that the record reflects my personal performance and is accurate and complete. 10/06/2022   I,Amber Collins,acting as a scribe for Nance Pear, NP.,have documented all relevant documentation on the behalf of Nance Pear, NP,as directed by  Nance Pear, NP while in the presence of Nance Pear, NP.    Nance Pear, NP

## 2022-10-06 NOTE — Patient Instructions (Signed)
Hold your amlodipine dose if top blood pressure number is <110. Check blood pressure once daily for the next few days and send via mychart.

## 2022-10-06 NOTE — Assessment & Plan Note (Addendum)
BP Readings from Last 3 Encounters:  10/06/22 98/74  02/22/22 (!) 156/105  07/18/21 133/84   BP a little low today.  Has had occasional mild dizziness recently. She has not yet taken her AM medication. Recommended the following:  Hold your amlodipine dose if top blood pressure number is <110. Check blood pressure once daily for the next few days and send via mychart.

## 2022-10-11 ENCOUNTER — Encounter: Payer: Self-pay | Admitting: Family

## 2022-10-19 ENCOUNTER — Telehealth (INDEPENDENT_AMBULATORY_CARE_PROVIDER_SITE_OTHER): Payer: BC Managed Care – PPO | Admitting: Family Medicine

## 2022-10-19 ENCOUNTER — Other Ambulatory Visit (HOSPITAL_BASED_OUTPATIENT_CLINIC_OR_DEPARTMENT_OTHER): Payer: Self-pay

## 2022-10-19 DIAGNOSIS — U071 COVID-19: Secondary | ICD-10-CM

## 2022-10-19 MED ORDER — NIRMATRELVIR/RITONAVIR (PAXLOVID)TABLET
3.0000 | ORAL_TABLET | Freq: Two times a day (BID) | ORAL | 0 refills | Status: AC
  Filled 2022-10-19: qty 30, 5d supply, fill #0

## 2022-10-19 NOTE — Progress Notes (Signed)
Virtual Visit via Video Note  I connected with [name]  on 10/19/22 at 12:40 PM EST by a video enabled telemedicine application and verified that I am speaking with the correct person using two identifiers.  Location patient: [place] Location provider:work or home office Persons participating in the virtual visit: patient, provider  I discussed the limitations and requested verbal permission for telemedicine visit. The patient expressed understanding and agreed to proceed.   HPI:  Acute telemedicine visit for Covid19: -Onset: about 3-4 days, tested positive for covid this morning -Symptoms include: sore throat, nasal congestion, cough, loss of smell, ? Low grade fever, headache - mild, tired -Denies: CP, SOB, NVD -Has tried: theraflu -Pertinent past medical history: see below, has had covid once in the past, GFR was 92 in march -Pertinent medication allergies:No Known Allergies -COVID-19 vaccine status: thinks last dose over 1 year ago Immunization History  Administered Date(s) Administered   Influenza Split 11/17/2011   Influenza,inj,Quad PF,6+ Mos 10/28/2018, 10/02/2019, 10/13/2020, 09/22/2021, 10/06/2022   Influenza-Unspecified 09/03/2014, 09/04/2015   Pfizer Covid-19 Vaccine Bivalent Booster 11yrs & up 09/22/2021   Pneumococcal Conjugate-13 11/24/2014   Td 01/21/2010   Tdap 10/06/2022     ROS: See pertinent positives and negatives per HPI.  Past Medical History:  Diagnosis Date   Anemia    Anxiety    COVID-19 virus infection 05/24/2021   Depression    GERD (gastroesophageal reflux disease)    H. pylori infection    Hx of cardiovascular stress test    Myoview 6/12:  diaph atten, no ischemia, low risk   Hx of echocardiogram    Echo 6/12:  EF 50-55%, mild diast dysfn, mild LAE, mild MR   Hypertension    Irregular heart rhythm    unclear what arrhythmia   Migraine    Osteopenia 04/25/2019   Pneumothorax    pneumonia   Supraventricular tachycardia, paroxysmal    a.  visit to EF 03/2011;  b. Event monitor (@[facility] ) 6/12:  PVCs/PACs, 3 episodes of WCT (SVT with aberrancy)   Torus palatinus     Past Surgical History:  Procedure Laterality Date   ABDOMINAL HYSTERECTOMY  2005   CESAREAN SECTION     x 2   PLEURAL SCARIFICATION       Current Outpatient Medications:    amLODipine (NORVASC) 10 MG tablet, Take 1 tablet (10 mg total) by mouth daily., Disp: 90 tablet, Rfl: 0   nirmatrelvir/ritonavir EUA (PAXLOVID) 20 x 150 MG & 10 x 100MG  TABS, Take 3 tablets by mouth 2 (two) times daily for 5 days. (Take nirmatrelvir 150 mg two tablets twice daily for 5 days and ritonavir 100 mg one tablet twice daily for 5 days) Patient GFR is >60, Disp: 30 tablet, Rfl: 0  EXAM:  VITALS per patient if applicable:none  GENERAL: alert, oriented, appears well and in no acute distress  HEENT: atraumatic, conjunttiva clear, no obvious abnormalities on inspection of external nose and ears  NECK: normal movements of the head and neck  LUNGS: on inspection no signs of respiratory distress, breathing rate appears normal, no obvious gross SOB, gasping or wheezing  CV: no obvious cyanosis  MS: moves all visible extremities without noticeable abnormality  PSYCH/NEURO: pleasant and cooperative, no obvious depression or anxiety, speech and thought processing grossly intact  ASSESSMENT AND PLAN:  Discussed the following assessment and plan:  COVID-19   Discussed treatment options, side effect and risk of drug interactions, ideal treatment window, potential complications, isolation and precautions for COVID-19. Checked  for/reviewed last GFR - listed in HPI if available. After lengthy discussion, the patient opted for treatment with Paxlovid due to being higher risk for complications of covid or severe disease and other factors. Discussed EUA status of this drug and the fact that there is preliminary limited knowledge of risks/interactions/side effects per EUA document vs possible  benefits and precautions. Discussed interaction with norasc. Her BP was running on the low end so she has opted to take 1/4 dose or hold her norvasc while on paxlovid but monitor her BP and take 1/4-1/2 dose of elevated. She knows effect of paxlovid may last for several days after finishing course. This information was shared with patient during the visit and also was provided in patient instructions. Also, advised that patient discuss risks/interactions and use with pharmacist/treatment team as well.  Other symptomatic care measures summarized in patient instructions.  Work/School slipped offered: provided in patient instructions Advised to seek prompt virtual visit or in person care if worsening, new symptoms arise, or if is not improving with treatment as expected per our conversation of expected course. Discussed options for follow up care. Did let this patient know that I do telemedicine on Tuesdays and Thursdays for [place] and those are the days I am logged into the system. Advised to schedule follow up visit with PCP, [facility] virtual visits or [facility] if any further questions or concerns to avoid delays in care.   I discussed the assessment and treatment plan with the patient. The patient was provided an opportunity to ask questions and all were answered. The patient agreed with the plan and demonstrated an understanding of the instructions.     Renee Koyanagi, DO

## 2022-10-19 NOTE — Patient Instructions (Signed)
---------------------------------------------------------------------------------------------------------------------------    WORK SLIP:  Patient [name],  06-Nov-1961, was seen for a medical visit today, 10/19/22 . Please excuse from work for a COVID  illness. Patient will likely be contagious for an average of 7-10 days from the onset of symptoms, PLUS 1 day of no fever and improved symptoms.  Will defer to employer for a sooner return to work if patient has 2 negative covid tests 48 hours apart and is feeling better, OR if symptoms have resolved or improved, it is greater than 5 days since the onset of symptoms and the patient can wear a high-quality, tight fitting mask such as N95 or KN95 at all times for an additional 5 days.  Sincerely: E-signature: Dr. Kriste Basque, DO [facility] Ph: [phone]   ------------------------------------------------------------------------------------------------------------------------------     HOME CARE TIPS:  -COVID19 testing information: [email or web address].[email or web address]  Most pharmacies also offer testing and home test kits. If the Covid19 test is positive and you desire antiviral treatment, please contact a [facility] pharmacy or schedule a follow up virtual visit through your primary care office or through the [employer].  Other test to treat options: http://www.[email or web address]-[email or web address].[email or web address]_[email or web address]  -I sent the medication(s) we discussed to your pharmacy: Meds ordered this encounter  Medications   nirmatrelvir/ritonavir EUA (PAXLOVID) 20 x 150 MG & 10 x 100MG  TABS    Sig: Take 3 tablets by mouth 2 (two) times daily for 5 days. (Take nirmatrelvir 150 mg two tablets twice daily for 5 days and ritonavir 100 mg one tablet twice daily for 5 days) Patient GFR is >60    Dispense:  30 tablet    Refill:  0     -I sent in the Covid19 treatment or referral you requested per our  discussion. Please see the information provided below and discuss further with the pharmacist/treatment team.  -If taking Paxlovid, please review all medications, supplement and over the counter drugs with your pharmacist and ask them to check for any interactions. Please make the following changes to your regular medications while taking Paxlovid: *decrease your amlodipine (Norvasc) to 1/4 or 1/2 tablet daily and monitor blood pressures *may resume usual dose 3 days after finishing Paxlovid *Call if any concerns and as for a virtual visit to address  -there is a chance of rebound illness with covid after improving. This can happen whether or not you take an antiviral treatment. If you become sick again with covid after getting better, please schedule a follow up virtual visit and isolate again.  -can use tylenol  if needed for fevers, aches and pains per instructions  -nasal saline sinus rinses twice daily  -stay hydrated, drink plenty of fluids and eat small healthy meals - avoid dairy  -follow up with your doctor in 2-3 days unless improving and feeling better  -stay home while sick, except to seek medical care. If you have COVID19, you will likely be contagious for 7-10 days. Flu or Influenza is likely contagious for about 7 days. Other respiratory viral infections remain contagious for 5-10+ days depending on the virus and many other factors. Wear a good mask that fits snugly (such as N95 or KN95) if around others to reduce the risk of transmission.  It was nice to meet you today, and I really hope you are feeling better soon. I help [facility] out with telemedicine visits on Tuesdays and Thursdays and am happy to help if you need a follow up virtual visit on those days. Otherwise, if  you have any concerns or questions following this visit please schedule a follow up visit with your Primary Care doctor or seek care at a local urgent care clinic to avoid delays in care.    Seek in person  care or schedule a follow up video visit promptly if your symptoms worsen, new concerns arise or you are not improving with treatment. Call 911 and/or seek emergency care if your symptoms are severe or life threatening.   See the following link for the most recent information regarding Paxlovid:  www.paxlovid.com   Nirmatrelvir; Ritonavir Tablets What is this medication? NIRMATRELVIR; RITONAVIR (NIR ma TREL vir; ri TOE na veer) treats mild to moderate COVID-19. It may help people who are at high risk of developing severe illness. This medication works by limiting the spread of the virus in your body. The FDA has allowed the emergency use of this medication. This medicine may be used for other purposes; ask your health care provider or pharmacist if you have questions. COMMON BRAND NAME(S): PAXLOVID What should I tell my care team before I take this medication? They need to know if you have any of these conditions: Any allergies Any serious illness Kidney disease Liver disease An unusual or allergic reaction to nirmatrelvir, ritonavir, other medications, foods, dyes, or preservatives Pregnant or trying to get pregnant Breast-feeding How should I use this medication? This product contains 2 different medications that are packaged together. For the standard dose, take 2 pink tablets of nirmatrelvir with 1 white tablet of ritonavir (3 tablets total) by mouth with water twice daily. Talk to your care team if you have kidney disease. You may need a different dose. Swallow the tablets whole. You can take it with or without food. If it upsets your stomach, take it with food. Take all of this medication unless your care team tells you to stop it early. Keep taking it even if you think you are better. Talk to your care team about the use of this medication in children. While it may be prescribed for children as young as 12 years for selected conditions, precautions do apply. Overdosage: If you think  you have taken too much of this medicine contact a poison control center or emergency room at once. NOTE: This medicine is only for you. Do not share this medicine with others. What if I miss a dose? If you miss a dose, take it as soon as you can unless it is more than 8 hours late. If it is more than 8 hours late, skip the missed dose. Take the next dose at the normal time. Do not take extra or 2 doses at the same time to make up for the missed dose. What may interact with this medication? Do not take this medication with any of the following medications: Alfuzosin Certain medications for anxiety or sleep like midazolam, triazolam Certain medications for cancer like apalutamide, enzalutamide Certain medications for cholesterol like lovastatin, simvastatin Certain medications for irregular heart beat like amiodarone, dronedarone, flecainide, propafenone, quinidine Certain medications for pain like meperidine, piroxicam Certain medications for psychotic disorders like clozapine, lurasidone, pimozide Certain medications for seizures like carbamazepine, phenobarbital, phenytoin Colchicine Eletriptan Eplerenone Ergot alkaloids like dihydroergotamine, ergonovine, ergotamine, methylergonovine Finerenone Flibanserin Ivabradine Lomitapide Naloxegol Ranolazine Rifampin Sildenafil Silodosin St. John's Wort Tolvaptan Ubrogepant Voclosporin This medication may also interact with the following medications: Bedaquiline Birth control pills Bosentan Certain antibiotics like erythromycin or clarithromycin Certain medications for blood pressure like amlodipine, diltiazem, felodipine, nicardipine, nifedipine Certain medications for  cancer like abemaciclib, ceritinib, dasatinib, encorafenib, ibrutinib, ivosidenib, neratinib, nilotinib, venetoclax, vinblastine, vincristine Certain medications for cholesterol like atorvastatin, rosuvastatin Certain medications for depression like bupropion,  trazodone Certain medications for fungal infections like isavuconazonium, itraconazole, ketoconazole, voriconazole Certain medications for hepatitis C like elbasvir; grazoprevir, dasabuvir; ombitasvir; paritaprevir; ritonavir, glecaprevir; pibrentasvir, sofosbuvir; velpatasvir; voxilaprevir Certain medications for HIV or AIDS Certain medications for irregular heartbeat like lidocaine Certain medications that treat or prevent blood clots like rivaroxaban, warfarin Digoxin Fentanyl Medications that lower your chance of fighting infection like cyclosporine, sirolimus, tacrolimus Methadone Quetiapine Rifabutin Salmeterol Steroid medications like betamethasone, budesonide, ciclesonide, dexamethasone, fluticasone, methylprednisone, mometasone, triamcinolone This list may not describe all possible interactions. Give your health care provider a list of all the medicines, herbs, non-prescription drugs, or dietary supplements you use. Also tell them if you smoke, drink alcohol, or use illegal drugs. Some items may interact with your medicine. What should I watch for while using this medication? Your condition will be monitored carefully while you are receiving this medication. Visit your care team for regular checkups. Tell your care team if your symptoms do not start to get better or if they get worse. If you have untreated HIV infection, this medication may lead to some HIV medications not working as well in the future. Birth control may not work properly while you are taking this medication. Talk to your care team about using an extra method of birth control. What side effects may I notice from receiving this medication? Side effects that you should report to your care team as soon as possible: Allergic reactions--skin rash, itching, hives, swelling of the face, lips, tongue, or throat Liver injury--right upper belly pain, loss of appetite, nausea, light-colored stool, dark yellow or brown urine,  yellowing skin or eyes, unusual weakness or fatigue Redness, blistering, peeling, or loosening of the skin, including inside the mouth Side effects that usually do not require medical attention (report these to your care team if they continue or are bothersome): Change in taste Diarrhea General discomfort and fatigue Increase in blood pressure Muscle pain Nausea Stomach pain This list may not describe all possible side effects. Call your doctor for medical advice about side effects. You may report side effects to FDA at 1-800-FDA-1088. Where should I keep my medication? Keep out of the reach of children and pets. Store at room temperature between 20 and 25 degrees C (68 and 77 degrees F). Get rid of any unused medication after the expiration date. To get rid of medications that are no longer needed or have expired: Take the medication to a medication take-back program. Check with your pharmacy or law enforcement to find a location. If you cannot return the medication, check the label or package insert to see if the medication should be thrown out in the garbage or flushed down the toilet. If you are not sure, ask your care team. If it is safe to put it in the trash, take the medication out of the container. Mix the medication with cat litter, dirt, coffee grounds, or other unwanted substance. Seal the mixture in a bag or container. Put it in the trash. NOTE: This sheet is a summary. It may not cover all possible information. If you have questions about this medicine, talk to your doctor, pharmacist, or health care provider.  2022 Elsevier/Gold Standard (2021-08-22 00:00:00)

## 2022-11-01 ENCOUNTER — Other Ambulatory Visit (HOSPITAL_BASED_OUTPATIENT_CLINIC_OR_DEPARTMENT_OTHER): Payer: Self-pay

## 2022-11-29 ENCOUNTER — Other Ambulatory Visit (HOSPITAL_BASED_OUTPATIENT_CLINIC_OR_DEPARTMENT_OTHER): Payer: Self-pay

## 2022-11-29 ENCOUNTER — Telehealth: Payer: BC Managed Care – PPO | Admitting: Physician Assistant

## 2022-11-29 DIAGNOSIS — K5900 Constipation, unspecified: Secondary | ICD-10-CM

## 2022-11-29 DIAGNOSIS — K2900 Acute gastritis without bleeding: Secondary | ICD-10-CM

## 2022-11-29 MED ORDER — SUCRALFATE 1 G PO TABS
1.0000 g | ORAL_TABLET | Freq: Three times a day (TID) | ORAL | 0 refills | Status: DC
  Filled 2022-11-29: qty 34, 8d supply, fill #0
  Filled 2022-11-29: qty 6, 2d supply, fill #0
  Filled 2022-11-29: qty 40, 10d supply, fill #0

## 2022-11-29 MED ORDER — ONDANSETRON HCL 4 MG PO TABS
4.0000 mg | ORAL_TABLET | Freq: Three times a day (TID) | ORAL | 0 refills | Status: DC | PRN
  Filled 2022-11-29: qty 20, 7d supply, fill #0

## 2022-11-29 MED ORDER — POLYETHYLENE GLYCOL 3350 17 GM/SCOOP PO POWD
ORAL | 0 refills | Status: DC
  Filled 2022-11-29: qty 510, 30d supply, fill #0

## 2022-11-29 NOTE — Progress Notes (Signed)
Virtual Visit Consent   Regina Ganci, you are scheduled for a virtual visit with a [facility] provider today. Just as with appointments in the office, your consent must be obtained to participate. Your consent will be active for this visit and any virtual visit you may have with one of our providers in the next 365 days. If you have a MyChart account, a copy of this consent can be sent to you electronically.  As this is a virtual visit, video technology does not allow for your provider to perform a traditional examination. This may limit your provider's ability to fully assess your condition. If your provider identifies any concerns that need to be evaluated in person or the need to arrange testing (such as labs, EKG, etc.), we will make arrangements to do so. Although advances in technology are sophisticated, we cannot ensure that it will always work on either your end or our end. If the connection with a video visit is poor, the visit may have to be switched to a telephone visit. With either a video or telephone visit, we are not always able to ensure that we have a secure connection.  By engaging in this virtual visit, you consent to the provision of healthcare and authorize for your insurance to be billed (if applicable) for the services provided during this visit. Depending on your insurance coverage, you may receive a charge related to this service.  I need to obtain your verbal consent now. Are you willing to proceed with your visit today? [name] has provided verbal consent on 11/29/2022 for a virtual visit (video or telephone). Margaretann Loveless, PA-C  Date: 11/29/2022 7:40 AM  Virtual Visit via Video Note   I, Margaretann Loveless, connected with  [name]  ([id number], Jun 19, 1961) on 11/29/22 at  7:30 AM EST by a video-enabled telemedicine application and verified that I am speaking with the correct person using two identifiers.  Location: Patient: Virtual Visit Location  Patient: Home Provider: Virtual Visit Location Provider: Home Office   I discussed the limitations of evaluation and management by telemedicine and the availability of in person appointments. The patient expressed understanding and agreed to proceed.    History of Present Illness: [name] is a 61 y.o. who identifies as a female who was assigned female at birth, and is being seen today for stomach issue.  HPI: GI Problem The primary symptoms include fatigue, abdominal pain, nausea and myalgias. Primary symptoms do not include fever, vomiting, diarrhea, melena, hematemesis or hematochezia. The illness began 3 to 5 days ago (Started Friday). The onset was sudden. The problem has not changed since onset. The illness is also significant for chills, anorexia, bloating and constipation. The illness does not include tenesmus, back pain or itching.   Does have history of H. Pylori.   Had been in ER with her son on Friday and that was when symptoms started.    Problems:  Patient Active Problem List   Diagnosis Date Noted   Right knee pain 02/22/2022   Paresthesia 02/22/2022   Family history of diabetes mellitus 02/22/2022   Plantar fasciitis 07/18/2021   Lumbar radiculopathy 03/23/2021   Osteopenia 04/25/2019   Compression fracture of body of thoracic vertebra (HCC) 01/19/2015   Abnormal CT scan 12/17/2014   Reactive airway disease that is not asthma 11/18/2014   History of pneumothorax 11/10/2014   Gastroesophageal reflux disease without esophagitis 07/27/2014   SVT (supraventricular tachycardia) 12/27/2012   Depression 11/17/2009   Anemia 02/03/2009  EXOSTOSIS OF JAW 01/28/2009   Anxiety state 01/21/2009   Migraine without aura 01/21/2009   Essential hypertension 01/21/2009    Allergies: No Known Allergies Medications:  Current Outpatient Medications:    ondansetron (ZOFRAN) 4 MG tablet, Take 1 tablet (4 mg total) by mouth every 8 (eight) hours as needed for nausea or  vomiting., Disp: 20 tablet, Rfl: 0   polyethylene glycol powder (GLYCOLAX/MIRALAX) 17 GM/SCOOP powder, Use 1 capful daily until stool returns to normal, Disp: 3350 g, Rfl: 0   sucralfate (CARAFATE) 1 g tablet, Take 1 tablet (1 g total) by mouth 4 (four) times daily -  with meals and at bedtime., Disp: 40 tablet, Rfl: 0   amLODipine (NORVASC) 10 MG tablet, Take 1 tablet (10 mg total) by mouth daily., Disp: 90 tablet, Rfl: 0  Observations/Objective: Patient is well-developed, well-nourished in no acute distress.  Resting comfortably at home.  Head is normocephalic, atraumatic.  No labored breathing.  Speech is clear and coherent with logical content.  Patient is alert and oriented at baseline.    Assessment and Plan: 1. Acute gastritis without hemorrhage, unspecified gastritis type - sucralfate (CARAFATE) 1 g tablet; Take 1 tablet (1 g total) by mouth 4 (four) times daily -  with meals and at bedtime.  Dispense: 40 tablet; Refill: 0 - ondansetron (ZOFRAN) 4 MG tablet; Take 1 tablet (4 mg total) by mouth every 8 (eight) hours as needed for nausea or vomiting.  Dispense: 20 tablet; Refill: 0 - polyethylene glycol powder (GLYCOLAX/MIRALAX) 17 GM/SCOOP powder; Use 1 capful daily until stool returns to normal  Dispense: 3350 g; Refill: 0  - Suspect viral gastroenteritis vs gastritis vs H.Pylori (history) - Zofran for nausea - Sucralfate for gastritis symptoms - Miralax for constipation - Push fluids, electrolyte beverages - Liquid diet, then increase to soft/bland (BRAT) diet over next day, then increase diet as tolerated - Seek in person evaluation if not improving or symptoms worsen   Follow Up Instructions: I discussed the assessment and treatment plan with the patient. The patient was provided an opportunity to ask questions and all were answered. The patient agreed with the plan and demonstrated an understanding of the instructions.  A copy of instructions were sent to the patient via  MyChart unless otherwise noted below.    The patient was advised to call back or seek an in-person evaluation if the symptoms worsen or if the condition fails to improve as anticipated.  Time:  I spent 13 minutes with the patient via telehealth technology discussing the above problems/concerns.    Margaretann Loveless, PA-C

## 2022-11-29 NOTE — Patient Instructions (Signed)
[name], thank you for joining Renee Loveless, PA-C for today's virtual visit.  While this provider is not your primary care provider (PCP), if your PCP is located in our provider database this encounter information will be shared with them immediately following your visit.   A [facility] MyChart account gives you access to today's visit and all your visits, tests, and labs performed at [facility] " click here if you don't have a [facility] MyChart account or go to [email or web address]  Consent: (Patient) [name] provided verbal consent for this virtual visit at the beginning of the encounter.  Current Medications:  Current Outpatient Medications:    ondansetron (ZOFRAN) 4 MG tablet, Take 1 tablet (4 mg total) by mouth every 8 (eight) hours as needed for nausea or vomiting., Disp: 20 tablet, Rfl: 0   polyethylene glycol powder (GLYCOLAX/MIRALAX) 17 GM/SCOOP powder, Use 1 capful daily until stool returns to normal, Disp: 3350 g, Rfl: 0   sucralfate (CARAFATE) 1 g tablet, Take 1 tablet (1 g total) by mouth 4 (four) times daily -  with meals and at bedtime., Disp: 40 tablet, Rfl: 0   amLODipine (NORVASC) 10 MG tablet, Take 1 tablet (10 mg total) by mouth daily., Disp: 90 tablet, Rfl: 0   Medications ordered in this encounter:  Meds ordered this encounter  Medications   sucralfate (CARAFATE) 1 g tablet    Sig: Take 1 tablet (1 g total) by mouth 4 (four) times daily -  with meals and at bedtime.    Dispense:  40 tablet    Refill:  0    Order Specific Question:   Supervising Provider    Answer:   LAMPTEY, PHILIP O [[id number]]   ondansetron (ZOFRAN) 4 MG tablet    Sig: Take 1 tablet (4 mg total) by mouth every 8 (eight) hours as needed for nausea or vomiting.    Dispense:  20 tablet    Refill:  0    Order Specific Question:   Supervising Provider    Answer:   Merrilee Jansky [[id number]]   polyethylene glycol powder (GLYCOLAX/MIRALAX) 17 GM/SCOOP powder     Sig: Use 1 capful daily until stool returns to normal    Dispense:  3350 g    Refill:  0    Order Specific Question:   Supervising Provider    Answer:   Merrilee Jansky [[id number]]     *If you need refills on other medications prior to your next appointment, please contact your pharmacy*  Follow-Up: Call back or seek an in-person evaluation if the symptoms worsen or if the condition fails to improve as anticipated.  [facility] [phone]  Other Instructions  Gastritis, Adult Gastritis is inflammation of the stomach. There are two kinds of gastritis: Acute gastritis. This kind develops suddenly. Chronic gastritis. This kind is much more common. It develops slowly and lasts for a long time. Gastritis happens when the lining of the stomach becomes weak or gets damaged. Without treatment, gastritis can lead to stomach bleeding and ulcers. What are the causes? This condition may be caused by: An infection. Drinking too much alcohol. Certain medicines. These include steroids, antibiotics, and some over-the-counter medicines, such as aspirin or ibuprofen. Having too much acid in the stomach. Having a disease of the stomach. Other causes may include: An allergic reaction. Some cancer treatments (radiation). Smoking cigarettes or the use of products that contain nicotine or tobacco. In some cases, the cause of this condition  is not known. What increases the risk? Having a disease of the intestines. Having a disease in which the body's immune system attacks the body (autoimmune disease), such as Crohn's disease. Using aspirin or ibuprofen and other NSAIDs to treat other conditions, such as heart disease or chronic pain. Stress. What are the signs or symptoms? Symptoms of this condition include: Pain or a burning sensation in the upper abdomen. Nausea. Vomiting. An uncomfortable feeling of fullness after eating. Weight loss. Bad breath. Blood in your vomit or  stool (feces). In some cases, there are no symptoms. How is this diagnosed? This condition may be diagnosed based on your medical history, a physical exam, and tests. Tests may include: Your medical history and a description of your symptoms. A physical exam. Tests. These can include: Blood tests. Stool tests. A test in which a thin, flexible instrument with a light and a camera is passed down the esophagus and into the stomach (upper endoscopy). A test in which a tissue sample is removed to look at it under a microscope (biopsy). How is this treated? This condition may be treated with medicines. The medicines that are used vary depending on the cause of the gastritis. If the condition is caused by a bacterial infection, you may be given antibiotic medicines. If the condition is caused by too much acid in the stomach, you may be given medicines called H2 blockers, proton pump inhibitors, or antacids. Treatment may also involve stopping the use of certain medicines such as aspirin or ibuprofen and other NSAIDs. Follow these instructions at home: Medicines Take over-the-counter and prescription medicines only as told by your health care provider. If you were prescribed an antibiotic medicine, take it as told by your health care provider. Do not stop taking the antibiotic even if you start to feel better. Alcohol use Do not drink alcohol if: Your health care provider tells you not to drink. You are pregnant, may be pregnant, or are planning to become pregnant. If you drink alcohol: Limit your use to: 0-1 drink a day for women. 0-2 drinks a day for men. Know how much alcohol is in your drink. In the [place] one drink equals one 12 oz bottle of beer (355 mL), one 5 oz glass of wine (148 mL), or one 1 oz glass of hard liquor (44 mL). General instructions  Eat small, frequent meals instead of large meals. Avoid foods and drinks that make your symptoms worse. Talk with your health care  provider about ways to manage stress, such as getting regular exercise or practicing deep breathing, meditation, or yoga. Do not use any products that contain nicotine or tobacco. These products include cigarettes, chewing tobacco, and vaping devices, such as e-cigarettes. If you need help quitting, ask your health care provider. Drink enough fluid to keep your urine pale yellow. Keep all follow-up visits. This is important. Contact a health care provider if: Your symptoms get worse. Your abdominal pain gets worse. Your symptoms return after treatment. You have a fever. Get help right away if: You vomit blood or a substance that looks like coffee grounds. You have black or dark red stools. You are unable to keep fluids down. These symptoms may represent a serious problem that is an emergency. Do not wait to see if the symptoms will go away. Get medical help right away. Call your local emergency services (911 in the [place] Do not drive yourself to the hospital. Summary Gastritis is inflammation of the lining of the stomach that  can occur suddenly (acute) or develop slowly over time (chronic). This condition is diagnosed with a medical history, a physical exam, or tests. This condition may be treated with medicines to treat infection or medicines to reduce the amount of acid in your stomach. Follow your health care provider's instructions about taking medicines, making changes to your diet, and knowing when to call for help. This information is not intended to replace advice given to you by your health care provider. Make sure you discuss any questions you have with your health care provider. Document Revised: 03/26/2021 Document Reviewed: 03/26/2021 Elsevier Patient Education  2023 Elsevier Inc.    If you have been instructed to have an in-person evaluation today at a local Urgent Care facility, please use the link below. It will take you to a list of all of our available [facility], including address, phone number and hours of operation. Please do not delay care.  [facility]  If you or a family member do not have a primary care provider, use the link below to schedule a visit and establish care. When you choose a [facility] primary care physician or advanced practice provider, you gain a long-term partner in health. Find a Primary Care Provider  Learn more about [facility]'s in-office and virtual care options: [facility] - Get Care Now

## 2023-01-03 ENCOUNTER — Other Ambulatory Visit: Payer: Self-pay | Admitting: Family

## 2023-01-03 MED ORDER — AMLODIPINE BESYLATE 10 MG PO TABS
10.0000 mg | ORAL_TABLET | Freq: Every day | ORAL | 0 refills | Status: DC
  Filled 2023-01-03: qty 90, 90d supply, fill #0

## 2023-01-04 ENCOUNTER — Other Ambulatory Visit (HOSPITAL_BASED_OUTPATIENT_CLINIC_OR_DEPARTMENT_OTHER): Payer: Self-pay

## 2023-05-02 ENCOUNTER — Other Ambulatory Visit: Payer: Self-pay | Admitting: Family

## 2023-05-02 ENCOUNTER — Other Ambulatory Visit (HOSPITAL_BASED_OUTPATIENT_CLINIC_OR_DEPARTMENT_OTHER): Payer: Self-pay | Admitting: Family

## 2023-05-02 ENCOUNTER — Encounter (HOSPITAL_BASED_OUTPATIENT_CLINIC_OR_DEPARTMENT_OTHER): Payer: Self-pay

## 2023-05-02 ENCOUNTER — Other Ambulatory Visit (HOSPITAL_BASED_OUTPATIENT_CLINIC_OR_DEPARTMENT_OTHER): Payer: Self-pay

## 2023-05-02 DIAGNOSIS — Z1231 Encounter for screening mammogram for malignant neoplasm of breast: Secondary | ICD-10-CM

## 2023-05-03 NOTE — Progress Notes (Signed)
Subjective:   By signing my name below, I, [name], attest that this documentation has been prepared under the direction and in the presence of Lemont Fillers, NP 05/04/23   Patient ID: [name], female    DOB: 1961-11-25, 62 y.o.   MRN: [id number]  Chief Complaint  Patient presents with   Hypertension    Here for follow up    HPI Patient is in today for an office visit.   Blood pressure: Her blood pressure is well controlled on 10 mg Amlodipine daily. She reports recent episodes of dizziness, which she believes may be attributed to her dose of Amlodipine. She has checked her blood pressure during an episode and reports a reading of 11/71. She is interested in reducing her dose from 10 mg to 7.5 mg daily.  BP Readings from Last 3 Encounters:  05/04/23 115/76  10/06/22 98/74  02/22/22 (!) 156/105    Past Medical History:  Diagnosis Date   Anemia    Anxiety    COVID-19 virus infection 05/24/2021   Depression    GERD (gastroesophageal reflux disease)    H. pylori infection    Hx of cardiovascular stress test    Myoview 6/12:  diaph atten, no ischemia, low risk   Hx of echocardiogram    Echo 6/12:  EF 50-55%, mild diast dysfn, mild LAE, mild MR   Hypertension    Irregular heart rhythm    unclear what arrhythmia   Migraine    Osteopenia 04/25/2019   Pneumothorax    pneumonia   Supraventricular tachycardia, paroxysmal    a. visit to EF 03/2011;  b. Event monitor (@[facility] ) 6/12:  PVCs/PACs, 3 episodes of WCT (SVT with aberrancy)   Torus palatinus     Past Surgical History:  Procedure Laterality Date   ABDOMINAL HYSTERECTOMY  2005   CESAREAN SECTION     x 2   PLEURAL SCARIFICATION      Family History  Problem Relation Age of Onset   Depression Mother    Diabetes Mother    Heart disease Mother    Hypertension Mother    Arthritis Mother    Mental illness Mother    Hypertension Father    Coronary artery disease Father    Heart disease Father     Heart failure Brother    Heart disease Brother    Hypertension Brother        x2   Hypertension Sister    Stomach cancer Neg Hx     Social History   Socioeconomic History   Marital status: Divorced    Spouse name: Not on file   Number of children: 2   Years of education: Not on file   Highest education level: Not on file  Occupational History   Occupation: Presenter, 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    Employer: [employer]  Tobacco Use   Smoking status: Never   Smokeless tobacco: Never  Substance and Sexual Activity   Alcohol use: No   Drug use: No   Sexual activity: Not on file  Other Topics Concern   Not on file  Social History Narrative   Not on file   Social Determinants of Health   Financial Resource Strain: Not on file  Food Insecurity: Not on file  Transportation Needs: Not on file  Physical Activity: Not on file  Stress: Not on file  Social Connections: Not on file  Intimate Partner Violence: Not on file    Outpatient Medications Prior to  Visit  Medication Sig Dispense Refill   ondansetron (ZOFRAN) 4 MG tablet Take 1 tablet (4 mg total) by mouth every 8 (eight) hours as needed for nausea or vomiting. 20 tablet 0   polyethylene glycol powder (GLYCOLAX/MIRALAX) 17 GM/SCOOP powder Use 1 capful daily until stool returns to normal 3350 g 0   sucralfate (CARAFATE) 1 g tablet Take 1 tablet (1 g total) by mouth 4 (four) times daily -  with meals and at bedtime. 40 tablet 0   amLODipine (NORVASC) 10 MG tablet Take 1 tablet (10 mg total) by mouth daily. 90 tablet 0   No facility-administered medications prior to visit.    No Known Allergies  Review of Systems  Neurological:  Positive for dizziness.       Objective:    Physical Exam Constitutional:      General: She is not in acute distress.    Appearance: Normal appearance. She is well-developed.  HENT:     Head: Normocephalic and atraumatic.     Right Ear: External ear normal.     Left Ear: External  ear normal.  Eyes:     General: No scleral icterus. Neck:     Thyroid: No thyromegaly.  Cardiovascular:     Rate and Rhythm: Normal rate and regular rhythm.     Heart sounds: Normal heart sounds. No murmur heard. Pulmonary:     Effort: Pulmonary effort is normal. No respiratory distress.     Breath sounds: Normal breath sounds. No wheezing.  Musculoskeletal:     Cervical back: Neck supple.  Skin:    General: Skin is warm and dry.  Neurological:     Mental Status: She is alert and oriented to person, place, and time.  Psychiatric:        Mood and Affect: Mood normal.        Behavior: Behavior normal.        Thought Content: Thought content normal.        Judgment: Judgment normal.     BP 115/76 (BP Location: Right Arm, Patient Position: Sitting, Cuff Size: Small)   Pulse 72   Temp 98.5 F (36.9 C) (Oral)   Resp 16   Wt 162 lb (73.5 kg)   SpO2 99%   BMI 23.24 kg/m  Wt Readings from Last 3 Encounters:  05/04/23 162 lb (73.5 kg)  10/06/22 168 lb 12.8 oz (76.6 kg)  02/22/22 156 lb (70.8 kg)       Assessment & Plan:  Migraine without aura and without status migrainosus, not intractable Assessment & Plan: No recent migraines.    Essential hypertension Assessment & Plan: BP looks good today but she has had some mild dizziness and it is on the low side. Will decrease amlodipine from 10mg  to 7.5mg  and she will monitor her bp at home.   Orders: -     amLODIPine Besylate; Take 1.5 tablets (7.5 mg total) by mouth daily.  Dispense: 135 tablet; Refill: 1 -     Basic metabolic panel  Osteopenia, unspecified location Assessment & Plan: Takes tums as she tolerates better than other calcium supplements. Dexa up to date.     Offered Shingrix today but pt declined, will think about it.   I,[name],acting as a Neurosurgeon for Lemont Fillers, NP.,have documented all relevant documentation on the behalf of Lemont Fillers, NP,as directed by  Lemont Fillers, NP  while in the presence of Lemont Fillers, NP.   I, Lemont Fillers, NP,  personally preformed the services described in this documentation.  All medical record entries made by the scribe were at my direction and in my presence.  I have reviewed the chart and discharge instructions (if applicable) and agree that the record reflects my personal performance and is accurate and complete. 05/04/23   Lemont Fillers, NP

## 2023-05-04 ENCOUNTER — Ambulatory Visit: Payer: BC Managed Care – PPO | Admitting: Family

## 2023-05-04 ENCOUNTER — Other Ambulatory Visit (HOSPITAL_BASED_OUTPATIENT_CLINIC_OR_DEPARTMENT_OTHER): Payer: Self-pay

## 2023-05-04 VITALS — BP 115/76 | HR 72 | Temp 98.5°F | Resp 16 | Wt 162.0 lb

## 2023-05-04 DIAGNOSIS — M8580 Other specified disorders of bone density and structure, unspecified site: Secondary | ICD-10-CM

## 2023-05-04 DIAGNOSIS — G43009 Migraine without aura, not intractable, without status migrainosus: Secondary | ICD-10-CM | POA: Diagnosis not present

## 2023-05-04 DIAGNOSIS — I10 Essential (primary) hypertension: Secondary | ICD-10-CM | POA: Diagnosis not present

## 2023-05-04 LAB — BASIC METABOLIC PANEL
BUN: 16 mg/dL (ref 6–23)
CO2: 29 mEq/L (ref 19–32)
Calcium: 9.3 mg/dL (ref 8.4–10.5)
Chloride: 105 mEq/L (ref 96–112)
Creatinine, Ser: 0.7 mg/dL (ref 0.40–1.20)
GFR: 91.56 mL/min (ref >60.00)
Glucose, Bld: 81 mg/dL (ref 70–99)
Potassium: 3.9 mEq/L (ref 3.5–5.1)
Sodium: 141 mEq/L (ref 135–145)

## 2023-05-04 MED ORDER — AMLODIPINE BESYLATE 5 MG PO TABS
7.5000 mg | ORAL_TABLET | Freq: Every day | ORAL | 1 refills | Status: DC
  Filled 2023-05-04: qty 135, 90d supply, fill #0
  Filled 2023-08-07: qty 135, 90d supply, fill #1

## 2023-05-04 NOTE — Assessment & Plan Note (Signed)
BP looks good today but she has had some mild dizziness and it is on the low side. Will decrease amlodipine from 10mg  to 7.5mg  and she will monitor her bp at home.

## 2023-05-04 NOTE — Assessment & Plan Note (Signed)
Takes tums as she tolerates better than other calcium supplements. Dexa up to date.

## 2023-05-04 NOTE — Assessment & Plan Note (Signed)
No recent migraines.  

## 2023-05-10 ENCOUNTER — Ambulatory Visit (HOSPITAL_BASED_OUTPATIENT_CLINIC_OR_DEPARTMENT_OTHER)
Admission: RE | Admit: 2023-05-10 | Discharge: 2023-05-10 | Disposition: A | Discharge status: Home / Self Care | Payer: BC Managed Care – PPO | Source: Ambulatory Visit | Attending: Family | Admitting: Family

## 2023-05-10 ENCOUNTER — Inpatient Hospital Stay (HOSPITAL_BASED_OUTPATIENT_CLINIC_OR_DEPARTMENT_OTHER): Admission: RE | Admit: 2023-05-10 | Payer: BC Managed Care – PPO | Source: Ambulatory Visit

## 2023-05-10 DIAGNOSIS — Z1231 Encounter for screening mammogram for malignant neoplasm of breast: Secondary | ICD-10-CM | POA: Insufficient documentation

## 2023-08-07 ENCOUNTER — Other Ambulatory Visit (HOSPITAL_BASED_OUTPATIENT_CLINIC_OR_DEPARTMENT_OTHER): Payer: Self-pay

## 2023-11-07 ENCOUNTER — Telehealth: Payer: Self-pay | Admitting: Family

## 2023-11-07 ENCOUNTER — Encounter: Payer: BC Managed Care – PPO | Admitting: Family

## 2023-11-07 NOTE — Telephone Encounter (Signed)
Prescription Request  11/07/2023  Is this a "Controlled Substance" medicine? No  LOV: Visit date not found  What is the name of the medication or equipment? amLODipine (NORVASC) 5 MG tablet  Have you contacted your pharmacy to request a refill? No   Which pharmacy would you like this sent to?  [facility] [address], [address] [place] [address] Phone: [phone] Fax: [phone]     Patient notified that their request is being sent to the clinical staff for review and that they should receive a response within 2 business days.   Please advise at [facility] [phone]

## 2023-11-08 ENCOUNTER — Other Ambulatory Visit (HOSPITAL_BASED_OUTPATIENT_CLINIC_OR_DEPARTMENT_OTHER): Payer: Self-pay

## 2023-11-08 ENCOUNTER — Other Ambulatory Visit: Payer: Self-pay

## 2023-11-08 DIAGNOSIS — I10 Essential (primary) hypertension: Secondary | ICD-10-CM

## 2023-11-08 MED ORDER — AMLODIPINE BESYLATE 5 MG PO TABS
7.5000 mg | ORAL_TABLET | Freq: Every day | ORAL | 1 refills | Status: DC
Start: 2023-11-08 — End: 2024-07-29
  Filled 2023-11-08: qty 135, 90d supply, fill #0
  Filled 2024-02-20: qty 135, 90d supply, fill #1

## 2023-11-08 NOTE — Telephone Encounter (Signed)
Rx sent 

## 2023-12-06 ENCOUNTER — Telehealth: Payer: Self-pay | Admitting: Family

## 2023-12-06 NOTE — Telephone Encounter (Signed)
 Copied from CRM [id number]. 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 Patient does currently have annual physical scheduled on 1/21 but would need appointment as soon as possible because she can't start position until this is done. When checking for sooner physical appointment soonest is 1/14 and she needs to be seen sooner so she can start her job. She is also in between insurances so visit would be self paid. Please contact patient to advise

## 2023-12-06 NOTE — Telephone Encounter (Signed)
 Pt rescheduled for 12/11/22 with Ladona Ridgel

## 2023-12-10 ENCOUNTER — Encounter: Payer: Self-pay | Admitting: Family

## 2023-12-12 ENCOUNTER — Encounter: Payer: Self-pay | Admitting: Family

## 2023-12-14 ENCOUNTER — Encounter: Payer: Self-pay | Admitting: Family

## 2023-12-14 ENCOUNTER — Ambulatory Visit (INDEPENDENT_AMBULATORY_CARE_PROVIDER_SITE_OTHER): Payer: Self-pay | Admitting: Family

## 2023-12-14 VITALS — BP 122/82 | HR 63 | Temp 97.5°F | Resp 16 | Ht 70.0 in | Wt 153.0 lb

## 2023-12-14 DIAGNOSIS — Z0000 Encounter for general adult medical examination without abnormal findings: Secondary | ICD-10-CM | POA: Insufficient documentation

## 2023-12-14 DIAGNOSIS — Z0184 Encounter for antibody response examination: Secondary | ICD-10-CM

## 2023-12-14 LAB — COMPREHENSIVE METABOLIC PANEL
ALT: 14 U/L (ref 0–35)
AST: 16 U/L (ref 0–37)
Albumin: 4.4 g/dL (ref 3.5–5.2)
Alkaline Phosphatase: 89 U/L (ref 39–117)
BUN: 13 mg/dL (ref 6–23)
CO2: 30 meq/L (ref 19–32)
Calcium: 9.2 mg/dL (ref 8.4–10.5)
Chloride: 105 meq/L (ref 96–112)
Creatinine, Ser: 0.64 mg/dL (ref 0.40–1.20)
GFR: 93.15 mL/min (ref >60.00)
Glucose, Bld: 89 mg/dL (ref 70–99)
Potassium: 4.1 meq/L (ref 3.5–5.1)
Sodium: 142 meq/L (ref 135–145)
Total Bilirubin: 0.5 mg/dL (ref 0.2–1.2)
Total Protein: 6.9 g/dL (ref 6.0–8.3)

## 2023-12-14 LAB — LIPID PANEL
Cholesterol: 202 mg/dL — ABNORMAL HIGH (ref 0–200)
HDL: 52.9 mg/dL (ref >39.00)
LDL Cholesterol: 136 mg/dL — ABNORMAL HIGH (ref 0–99)
NonHDL: 149.03
Total CHOL/HDL Ratio: 4
Triglycerides: 63 mg/dL (ref 0.0–149.0)
VLDL: 12.6 mg/dL (ref 0.0–40.0)

## 2023-12-14 NOTE — Patient Instructions (Signed)
 VISIT SUMMARY:  During today's visit, we discussed your migraines, bone health, recent weight loss, hip pain, and immunizations. We also reviewed your general health maintenance and made plans for necessary follow-ups.  YOUR PLAN:  -MIGRAINES: Migraines are severe headaches often accompanied by nausea and sensitivity to light and sound. Continue with your current management plan for migraines.  -OSTEOPOROSIS: Osteoporosis is a condition where bones become weak and brittle. Ensure you are getting 1200mg  of calcium  daily through diet or supplements. Your next bone density scan is scheduled for April 2025.  -WEIGHT LOSS: You have experienced unintentional weight loss after cutting out sugar from your diet. Please continue to incorporate high-calorie healthy snacks into your diet and monitor your weight. Report any continued weight loss.  -CONSTIPATION: Constipation is infrequent or difficult bowel movements. Continue with your current management plan for constipation.  -HIP PAIN: Hip pain can be caused by various factors, including decreased physical activity. Try to incorporate regular walking into your routine and monitor any changes in your pain.  -IMMUNIZATIONS: We need to check your cholesterol levels and determine your Hepatitis B and Measles, Mumps, Rubella vaccination status. We will administer any necessary boosters based on the results.  -GENERAL HEALTH MAINTENANCE: Continue with regular exercise and ensure your vision and dental checks are up-to-date. We will also check your kidney function.  INSTRUCTIONS:  Please follow up with the recommended blood tests for cholesterol, Hepatitis B, and Measles, Mumps, Rubella titers. Schedule your next bone density scan for April 2025. Continue monitoring your weight and report any significant changes. Ensure you are getting 1200mg  of calcium  daily.

## 2023-12-14 NOTE — Progress Notes (Signed)
 Subjective:     Patient ID: [name], female    DOB: [birth year], 63 y.o.   MRN: [id number]  Chief Complaint  Patient presents with   Annual Exam    HPI  Discussed the use of AI scribe software for clinical note transcription with the patient, who gave verbal consent to proceed.  History of Present Illness   The patient, with a history of migraines and bone health issues, presents for a physical update.  She is due for a bone density test in the spring. The patient also reports a recent cold, which she is recovering from. She has experienced weight loss after cutting out sugar from her diet, which she is concerned about as she feels her clothes fit better with the extra weight. She is trying to incorporate high calorie healthy snacks into her diet to regain the weight. The patient also reports some hip pain, which she believes is due to not walking as much recently due to weather conditions. She is due for a flu shot and possibly other vaccinations.      Immunizations: flu shot today, declines covid booster Diet: healthy Exercise: walks most mornings Wt Readings from Last 3 Encounters:  12/14/23 153 lb (69.4 kg)  05/04/23 162 lb (73.5 kg)  10/06/22 168 lb 12.8 oz (76.6 kg)  Colonoscopy: due 1/26 Dexa: due 4/26 Pap Smear: hysterectomy Mammogram: 6/24 Vision: up to date Dental: up to date     Health Maintenance Due  Topic Date Due   HIV Screening  Never done   Zoster Vaccines- Shingrix (1 of 2) Never done   INFLUENZA VACCINE  07/05/2023   COVID-19 Vaccine (2 - 2024-25 season) 08/05/2023    Past Medical History:  Diagnosis Date   Anemia    Anxiety    COVID-19 virus infection 05/24/2021   Depression    GERD (gastroesophageal reflux disease)    H. pylori infection    Hx of cardiovascular stress test    Myoview 6/12:  diaph atten, no ischemia, low risk   Hx of echocardiogram    Echo 6/12:  EF 50-55%, mild diast dysfn, mild LAE, mild MR   Hypertension     Irregular heart rhythm    unclear what arrhythmia   Migraine    Osteopenia 04/25/2019   Pneumothorax    pneumonia   Supraventricular tachycardia, paroxysmal (HCC)    a. visit to EF 03/2011;  b. Event monitor (@[facility] ) 6/12:  PVCs/PACs, 3 episodes of WCT (SVT with aberrancy)   Torus palatinus     Past Surgical History:  Procedure Laterality Date   ABDOMINAL HYSTERECTOMY  2005   CESAREAN SECTION     x 2   PLEURAL SCARIFICATION      Family History  Problem Relation Age of Onset   Depression Mother    Diabetes Mother    Heart disease Mother    Hypertension Mother    Arthritis Mother    Mental illness Mother    Hypertension Father    Coronary artery disease Father    Heart disease Father    Heart failure Brother    Heart disease Brother    Hypertension Brother        x2   Hypertension Sister    Stomach cancer Neg Hx     Social History   Socioeconomic History   Marital status: Divorced    Spouse name: Not on file   Number of children: 2   Years of education: Not on file  Highest education level: Not on file  Occupational History   Occupation: Presenter, 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    Employer: [employer]  Tobacco Use   Smoking status: Never   Smokeless tobacco: Never  Substance and Sexual Activity   Alcohol use: No   Drug use: No   Sexual activity: Not on file  Other Topics Concern   Not on file  Social History Narrative   Used to work at [employer]- retired, 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   2 children (one lives with her one lives locally) both grown   Divorced   No pets   Enjoys clinical cytogeneticist (has a public relations account executive - makes museum/gallery exhibitions officer)   Social Drivers of Corporate Investment Banker Strain: Not on file  Food Insecurity: Not on file  Transportation Needs: Not on file  Physical Activity: Not on file  Stress: Not on file  Social Connections: Not on file  Intimate Partner Violence: Not on file    Outpatient Medications Prior to Visit  Medication  Sig Dispense Refill   amLODipine  (NORVASC ) 5 MG tablet Take 1.5 tablets (7.5 mg total) by mouth daily. 135 tablet 1   ondansetron  (ZOFRAN ) 4 MG tablet Take 1 tablet (4 mg total) by mouth every 8 (eight) hours as needed for nausea or vomiting. 20 tablet 0   polyethylene glycol powder (GLYCOLAX /MIRALAX ) 17 GM/SCOOP powder Use 1 capful daily until stool returns to normal 3350 g 0   sucralfate  (CARAFATE ) 1 g tablet Take 1 tablet (1 g total) by mouth 4 (four) times daily -  with meals and at bedtime. 40 tablet 0   No facility-administered medications prior to visit.    No Known Allergies  Review of Systems  Constitutional:  Positive for weight loss.  HENT:  Negative for congestion.   Eyes:  Negative for blurred vision.  Respiratory:  Negative for cough.   Cardiovascular:  Negative for leg swelling.  Gastrointestinal:  Positive for constipation (mild). Negative for diarrhea and heartburn.  Genitourinary:  Positive for dysuria. Negative for frequency.  Musculoskeletal:  Positive for joint pain (mild right hip pain). Negative for myalgias.  Skin:  Negative for rash.  Neurological:  Positive for headaches (mild headache).  Psychiatric/Behavioral:         Denies depression/anxiety       Objective:    Physical Exam   BP 122/82 (BP Location: Right Arm, Patient Position: Sitting, Cuff Size: Small)   Pulse 63   Temp (!) 97.5 F (36.4 C) (Oral)   Resp 16   Ht 5' 10 (1.778 m)   Wt 153 lb (69.4 kg)   SpO2 100%   BMI 21.95 kg/m  Wt Readings from Last 3 Encounters:  12/14/23 153 lb (69.4 kg)  05/04/23 162 lb (73.5 kg)  10/06/22 168 lb 12.8 oz (76.6 kg)   Lab Results  Component Value Date   CHOL 191 01/19/2010   HDL 58 01/19/2010   LDLCALC 124 (H) 01/19/2010   TRIG 46 01/19/2010   CHOLHDL 3.3 Ratio 01/19/2010   Physical Exam  Constitutional: She is oriented to person, place, and time. She appears well-developed and well-nourished. No distress.  HENT:  Head: Normocephalic and  atraumatic.  Right Ear: Tympanic membrane and ear canal normal.  Left Ear: Tympanic membrane and ear canal normal.  Mouth/Throat: Oropharynx is clear and moist.  Eyes: Pupils are equal, round, and reactive to light. No scleral icterus.  Neck: Normal range of motion. No thyromegaly present.  Cardiovascular:  Normal rate and regular rhythm.   No murmur heard. Pulmonary/Chest: Effort normal and breath sounds normal. No respiratory distress. He has no wheezes. She has no rales. She exhibits no tenderness.  Abdominal: Soft. Bowel sounds are normal. She exhibits no distension and no mass. There is no tenderness. There is no rebound and no guarding.  Musculoskeletal: She exhibits no edema.  Lymphadenopathy:    She has no cervical adenopathy.  Neurological: She is alert and oriented to person, place, and time. She has normal patellar reflexes. She exhibits normal muscle tone. Coordination normal.  Skin: Skin is warm and dry.  Psychiatric: She has a normal mood and affect. Her behavior is normal. Judgment and thought content normal.  Breast/pelvic: deferred           Assessment & Plan:       Assessment & Plan:   Problem List Items Addressed This Visit       Unprioritized   Preventative health care - Primary    Has not received flu shot for the current season- declines flu and covid vaccine  Weight Loss Reports unintentional weight loss after dietary changes. -Encourage high calorie healthy snacks. -Monitor weight and report if continued weight loss.  Immunizations Last cholesterol check in 2011. Uncertain about Hepatitis B vaccination status. -Check cholesterol levels. -Check Hepatitis B and Measles, Mumps, Rubella titers. -Administer necessary boosters based on titer results.  General Health Maintenance -Continue regular exercise. -Ensure up-to-date vision and dental checks. -Check kidney function.      Relevant Orders   Lipid panel   Other Visit Diagnoses        Immunity status testing       Relevant Orders   Comp Met (CMET)   Measles/Mumps/Rubella Immunity   Hepatitis B surface antibody,quantitative       I have discontinued Mari Roca's sucralfate , ondansetron , and polyethylene glycol powder. I am also having her maintain her amLODipine .  No orders of the defined types were placed in this encounter.

## 2023-12-14 NOTE — Assessment & Plan Note (Signed)
  Has not received flu shot for the current season- declines flu and covid vaccine  Weight Loss Reports unintentional weight loss after dietary changes. -Encourage high calorie healthy snacks. -Monitor weight and report if continued weight loss.  Immunizations Last cholesterol check in 2011. Uncertain about Hepatitis B vaccination status. -Check cholesterol levels. -Check Hepatitis B and Measles, Mumps, Rubella titers. -Administer necessary boosters based on titer results.  General Health Maintenance -Continue regular exercise. -Ensure up-to-date vision and dental checks. -Check kidney function.

## 2023-12-18 ENCOUNTER — Telehealth: Payer: Self-pay | Admitting: Family

## 2023-12-18 LAB — MEASLES/MUMPS/RUBELLA IMMUNITY
Mumps IgG: >300 [AU]/ml
Rubella: >33 {index}
Rubeola IgG: >300 [AU]/ml

## 2023-12-18 LAB — HEPATITIS B SURFACE ANTIBODY, QUANTITATIVE: Hep B S AB Quant (Post): <5 m[IU]/mL — ABNORMAL LOW (ref >=10)

## 2023-12-18 NOTE — Telephone Encounter (Signed)
 Copied from CRM [id number]. Topic: Medical Record Request - Other >> Dec 18, 2023  1:33 PM Shelbie Proctor wrote: Reason for CRM: Patient [phone] states needs forms to be filled out for patient to start work, when can patient pick up the forms?

## 2023-12-19 ENCOUNTER — Telehealth: Payer: Self-pay | Admitting: Family

## 2023-12-19 ENCOUNTER — Ambulatory Visit: Payer: Self-pay

## 2023-12-19 ENCOUNTER — Other Ambulatory Visit: Payer: Self-pay | Admitting: Emergency Medicine

## 2023-12-19 ENCOUNTER — Ambulatory Visit (INDEPENDENT_AMBULATORY_CARE_PROVIDER_SITE_OTHER): Payer: Self-pay | Admitting: Emergency Medicine

## 2023-12-19 DIAGNOSIS — Z23 Encounter for immunization: Secondary | ICD-10-CM

## 2023-12-19 NOTE — Telephone Encounter (Signed)
 Patient notified and scheduled to have 1st Heplisav B today next one due in 2 months.

## 2023-12-19 NOTE — Telephone Encounter (Signed)
 Patient notified lab results back yesterday, she will need hepatitis b series. She was scheduled for later today

## 2023-12-19 NOTE — Telephone Encounter (Signed)
 Please advise pt that her testing shows immunity to Measles Mumps and Rubella.  However she does not have immunity to Hep B.  I would recommend that she begin Heplisav B series.

## 2023-12-25 ENCOUNTER — Encounter: Payer: BC Managed Care – PPO | Admitting: Family

## 2023-12-28 ENCOUNTER — Ambulatory Visit: Payer: Self-pay | Admitting: Family

## 2023-12-28 NOTE — Telephone Encounter (Signed)
2nd attempt - received VMB, LVM for return call. Will continue to attempt.

## 2023-12-28 NOTE — Telephone Encounter (Signed)
Called [facility] and spoke with staff member who states patient will need a note or order from provider for TB skin test and then a nurse visit appointment scheduled. (Only day office does not perform test is on Thursdays and if she wishes to come in today, she will need to return Monday to have the test read).  Copied from CRM [id number]. Topic: Clinical - Medical Advice >> Dec 28, 2023 10:35 AM Shelbie Proctor wrote: Reason for CRM: Patient [phone] states her work needs a TB test results, and she's unsure if she should have there skin test? Patient is asking if this can be done today, so she can start work. Please advise and call back.

## 2023-12-28 NOTE — Telephone Encounter (Signed)
3rd attempt - Received VMB, LVM. Routing to office for follow-up.

## 2023-12-31 ENCOUNTER — Encounter: Payer: Medicaid Other | Admitting: Family

## 2024-01-07 ENCOUNTER — Ambulatory Visit: Payer: Medicaid Other | Admitting: Family

## 2024-01-07 VITALS — BP 113/77 | HR 73 | Temp 98.8°F | Resp 16 | Ht 70.0 in | Wt 149.0 lb

## 2024-01-07 DIAGNOSIS — Z111 Encounter for screening for respiratory tuberculosis: Secondary | ICD-10-CM | POA: Diagnosis not present

## 2024-01-07 NOTE — Progress Notes (Unsigned)
Subjective:     Patient ID: [name], female    DOB: 08-27-1961, 63 y.o.   MRN: [id number]  Chief Complaint  Patient presents with   TB screening    Patient needs tuberculosis screening for work     HPI  Discussed the use of AI scribe software for clinical note transcription with the patient, who gave verbal consent to proceed.  History of Present Illness          Patient presents today for PPD placement at the request of her employer.      Health Maintenance Due  Topic Date Due   HIV Screening  Never done   Zoster Vaccines- Shingrix (1 of 2) Never done   INFLUENZA VACCINE  07/05/2023   COVID-19 Vaccine (2 - 2024-25 season) 08/05/2023    Past Medical History:  Diagnosis Date   Anemia    Anxiety    COVID-19 virus infection 05/24/2021   Depression    GERD (gastroesophageal reflux disease)    H. pylori infection    Hx of cardiovascular stress test    Myoview 6/12:  diaph atten, no ischemia, low risk   Hx of echocardiogram    Echo 6/12:  EF 50-55%, mild diast dysfn, mild LAE, mild MR   Hypertension    Irregular heart rhythm    unclear what arrhythmia   Migraine    Osteopenia 04/25/2019   Pneumothorax    pneumonia   Supraventricular tachycardia, paroxysmal (HCC)    a. visit to EF 03/2011;  b. Event monitor (@[facility] ) 6/12:  PVCs/PACs, 3 episodes of WCT (SVT with aberrancy)   Torus palatinus     Past Surgical History:  Procedure Laterality Date   ABDOMINAL HYSTERECTOMY  2005   CESAREAN SECTION     x 2   PLEURAL SCARIFICATION      Family History  Problem Relation Age of Onset   Depression Mother    Diabetes Mother    Heart disease Mother    Hypertension Mother    Arthritis Mother    Mental illness Mother    Hypertension Father    Coronary artery disease Father    Heart disease Father    Heart failure Brother    Heart disease Brother    Hypertension Brother        x2   Hypertension Sister    Stomach cancer Neg Hx     Social History    Socioeconomic History   Marital status: Divorced    Spouse name: Not on file   Number of children: 2   Years of education: Not on file   Highest education level: Not on file  Occupational History   Occupation: Presenter, 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    Employer: [employer]  Tobacco Use   Smoking status: Never   Smokeless tobacco: Never  Substance and Sexual Activity   Alcohol use: No   Drug use: No   Sexual activity: Not on file  Other Topics Concern   Not on file  Social History Narrative   Used to work at [employer]- retired, 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 135 tablet 1   No facility-administered medications prior to visit.    No Known Allergies  ROS See HPI    Objective:    Physical Exam Constitutional:      Appearance: Normal appearance.  Pulmonary:     Effort: Pulmonary effort is normal.  Neurological:     Mental Status: She is alert.  Psychiatric:        Mood and Affect: Mood normal.        Behavior: Behavior normal.        Thought Content: Thought content normal.        Judgment: Judgment normal.      BP 113/77 (BP Location: Left Arm, Patient Position: Sitting, Cuff Size: Normal)   Pulse 73   Temp 98.8 F (37.1 C) (Oral)   Resp 16   Ht 5\' 10"  (1.778 m)   Wt 149 lb (67.6 kg)   SpO2 100%   BMI 21.38 kg/m  Wt Readings from Last 3 Encounters:  01/07/24 149 lb (67.6 kg)   12/14/23 153 lb (69.4 kg)  05/04/23 162 lb (73.5 kg)       Assessment & Plan:   Problem List Items Addressed This Visit   None Visit Diagnoses       Tuberculosis screening    -  Primary   Relevant Orders   PPD     PPD placed today by CMA.  Plan is to return for nurse visit in 2 days for PPD reading.  I am having Phuong Mount maintain her amLODipine.  No orders of the defined types were placed in this encounter.

## 2024-01-09 ENCOUNTER — Ambulatory Visit (INDEPENDENT_AMBULATORY_CARE_PROVIDER_SITE_OTHER): Payer: Medicaid Other

## 2024-01-09 DIAGNOSIS — Z111 Encounter for screening for respiratory tuberculosis: Secondary | ICD-10-CM | POA: Diagnosis not present

## 2024-01-09 LAB — TB SKIN TEST
Induration: 0 mm
TB Skin Test: NEGATIVE

## 2024-01-09 NOTE — Progress Notes (Signed)
 Pt here for PPD reading. Reading was negative.

## 2024-01-11 DIAGNOSIS — Z111 Encounter for screening for respiratory tuberculosis: Secondary | ICD-10-CM

## 2024-02-20 ENCOUNTER — Other Ambulatory Visit: Payer: Self-pay

## 2024-05-12 ENCOUNTER — Telehealth: Payer: Self-pay

## 2024-05-12 ENCOUNTER — Ambulatory Visit: Admitting: Family Medicine

## 2024-05-12 ENCOUNTER — Ambulatory Visit (HOSPITAL_BASED_OUTPATIENT_CLINIC_OR_DEPARTMENT_OTHER)
Admission: RE | Admit: 2024-05-12 | Discharge: 2024-05-12 | Disposition: A | Discharge status: Home / Self Care | Source: Ambulatory Visit | Attending: Family Medicine | Admitting: Family Medicine

## 2024-05-12 ENCOUNTER — Encounter: Payer: Self-pay | Admitting: Family Medicine

## 2024-05-12 ENCOUNTER — Ambulatory Visit: Payer: Self-pay | Admitting: Family Medicine

## 2024-05-12 VITALS — BP 112/68 | HR 74 | Temp 99.3°F | Resp 16 | Ht 70.0 in | Wt 144.0 lb

## 2024-05-12 DIAGNOSIS — S92512A Displaced fracture of proximal phalanx of left lesser toe(s), initial encounter for closed fracture: Secondary | ICD-10-CM | POA: Diagnosis not present

## 2024-05-12 DIAGNOSIS — M79675 Pain in left toe(s): Secondary | ICD-10-CM | POA: Diagnosis not present

## 2024-05-12 DIAGNOSIS — S90122A Contusion of left lesser toe(s) without damage to nail, initial encounter: Secondary | ICD-10-CM | POA: Diagnosis not present

## 2024-05-12 NOTE — Telephone Encounter (Signed)
 Was seen today.

## 2024-05-12 NOTE — Telephone Encounter (Signed)
 Initial Comment Caller states she thinks she broke her toe yesterday, can barely walk on it and has some concerns Translation No Nurse Assessment Nurse: Merri Abbe, RN, Sanjuanita Janie Date/Time (Eastern Time): 05/12/2024 7:07:12 AM Confirm and document reason for call. If symptomatic, describe symptoms. ---Caller states she jammed her left second toe into the leg of a sofa, heard a 'crack'; happened yesterday. States toe was crooked & she taped it. Denies pain now, 'still in bed', but had to walk w/a crutch yesterday. Denies fever now, cuts, or any other symptoms. Does the patient have any new or worsening symptoms? ---Yes Will a triage be completed? ---Yes Related visit to physician within the last 2 weeks? ---N/A Does the PT have any chronic conditions? (i.e. diabetes, asthma, this includes High risk factors for pregnancy, etc.) ---Unknown Is this a behavioral health or substance abuse call? ---No Guidelines Guideline Title Affirmed Question Affirmed Notes Nurse Date/Time (Eastern Time) Toe Injury Looks like a broken bone (e.g., crooked or deformed) Cazares, RN, Sanjuanita Janie 05/12/2024 7:04:57 AM Disp. Time [facility] Time) Disposition Final User 05/12/2024 7:07:02 AM See HCP within 4 Hours (or PCP triage) Yes Cazares, RN, Sanjuanita Janie PLEASE NOTE: All timestamps contained within this report are represented as [place] Standard Time. CONFIDENTIALTY NOTICE: This fax transmission is intended only for the addressee. It contains information that is legally privileged, confidential or otherwise protected from use or disclosure. If you are not the intended recipient, you are strictly prohibited from reviewing, disclosing, copying using or disseminating any of this information or taking any action in reliance on or regarding this information. If you have received this fax in error, please notify us  immediately by telephone so that we can arrange for its return to us . Phone:  [phone], Toll-Free: [phone], Fax: [phone] [email or web address] 07/12/1961 Page: 2 of 2 CallId: [id number] Final Disposition 05/12/2024 7:07:02 AM See HCP within 4 Hours (or PCP triage) Yes Merri Abbe, RN, Hampton Levins Caller Disagree/Comply Comply Caller Understands Yes PreDisposition Home Care Care Advice Given Per Guideline SEE HCP (OR PCP TRIAGE) WITHIN 4 HOURS: PAIN MEDICINES: CARE ADVICE given per Toe Injury (Adult) guideline. * You become worse CALL BACK IF: Referrals REFERRED TO PCP OFFICE

## 2024-05-12 NOTE — Progress Notes (Signed)
 Established Patient Office Visit  Subjective   Patient ID: [name], female    DOB: [birth year]  Age: 63 y.o. MRN: [id number]  Chief Complaint  Patient presents with   Toe Injury    Patient left foot reports toe injury yesterday    HPI Discussed the use of AI scribe software for clinical note transcription with the patient, who gave verbal consent to proceed.  History of Present Illness [name] is a 63 year old female who presents with a suspected broken toe after jamming it into a sofa.  Yesterday morning, she jammed her toe into the sofa and heard a crack, resulting in the toe moving to the side. The pain is described as excruciating, initially more severe, and currently rated as a 'ten out of ten' when attempting to walk. The pain is localized to the toe, radiating upwards, and is exacerbated by movement.  She has been unable to walk on the affected toe, which is not the big toe, and has attempted to manage the pain by buddy taping the toe with medical tape, providing some relief. She is concerned about the impact on her mobility, as she is a fast walker and finds it difficult to be unable to move freely.  She wears a size ten to ten and a half shoe, and the injury is on her left foot, which is larger than her right. No pain is reported in the upper part of the foot.   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 pylori infection    Hx of cardiovascular stress test    Myoview 6/12:  diaph atten, no ischemia, low risk   Hx of echocardiogram    Echo 6/12:  EF 50-55%, mild diast dysfn, mild LAE, mild MR   Hypertension    Irregular heart rhythm    unclear what arrhythmia   Migraine    Osteopenia 04/25/2019   Pneumothorax    pneumonia   Supraventricular tachycardia, paroxysmal (HCC)    a. visit to EF 03/2011;  b. Event monitor (@[facility] ) 6/12:  PVCs/PACs, 3 episodes of WCT (SVT with aberrancy)   Torus palatinus    Past Surgical History:  Procedure Laterality Date   ABDOMINAL HYSTERECTOMY  2005   CESAREAN SECTION     x 2   PLEURAL SCARIFICATION     Social History   Tobacco Use   Smoking status: Never   Smokeless tobacco: Never  Substance Use Topics   Alcohol use: No   Drug use: No   Social History   Socioeconomic History   Marital status: Divorced    Spouse name:  Not on file   Number of children: 2   Years of education: Not on file   Highest education level: Not on file  Occupational History   Occupation: Presenter, 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    Employer: [employer]  Tobacco Use   Smoking status: Never   Smokeless tobacco: Never  Substance and Sexual Activity   Alcohol use: No   Drug use: No   Sexual activity: Not on file  Other Topics Concern   Not on file  Social History Narrative   Used to work at [employer]- retired, 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   2 children (one lives with her one lives locally) both grown   Divorced   No pets   Enjoys Clinical cytogeneticist (has a Public relations account executive - makes Museum/gallery exhibitions officer)   Social Drivers of Research scientist (physical sciences) Strain: Not on file  Food Insecurity: Not on file  Transportation Needs: Not on file  Physical Activity: Not on file  Stress: Not on file  Social Connections: Not on file  Intimate Partner Violence: Not on file   Family Status  Relation Name Status   Mother  Alive   Father  Deceased   Brother  Alive   Sister  Alive   Brother  Alive   Sister  Alive   Neg Hx  (Not Specified)  No partnership data on file   Family History  Problem Relation Age of Onset   Depression Mother    Diabetes Mother    Heart disease Mother    Hypertension Mother    Arthritis Mother    Mental illness Mother    Hypertension Father    Coronary artery disease Father    Heart disease Father    Heart failure Brother    Heart disease Brother    Hypertension Brother        x2   Hypertension Sister    Stomach cancer Neg Hx    No Known Allergies    Review of Systems  Constitutional:  Negative for fever and malaise/fatigue.  HENT:  Negative for congestion.   Eyes:  Negative for blurred vision.  Respiratory:  Negative for cough and shortness of breath.   Cardiovascular:  Negative for chest pain, palpitations and leg swelling.  Gastrointestinal:  Negative for vomiting.  Musculoskeletal:  Positive for joint pain. Negative for back pain.  Skin:  Negative for rash.  Neurological:  Negative for loss of consciousness and headaches.      Objective:     BP 112/68 (BP Location: Left Arm, Patient Position: Sitting, Cuff Size: Normal)   Pulse 74   Temp 99.3 F (37.4 C) (Oral)   Resp 16   Ht 5\' 10"  (1.778 m)   Wt 144 lb (65.3 kg)   SpO2 100%   BMI 20.66 kg/m  BP Readings from Last 3 Encounters:  05/12/24 112/68  01/07/24 113/77  12/14/23 122/82   Wt Readings from Last 3 Encounters:  05/12/24 144 lb (65.3 kg)  01/07/24 149 lb (67.6 kg)  12/14/23 153 lb (69.4 kg)   SpO2 Readings from Last 3 Encounters:  05/12/24 100%  01/07/24 100%  12/14/23 100%      Physical Exam Vitals and  nursing note reviewed.  Constitutional:      General: She is not in acute distress.    Appearance: Normal appearance. She is well-developed.  HENT:     Head: Normocephalic and atraumatic.  Eyes:     General: No scleral icterus.  Right eye: No discharge.        Left eye: No discharge.  Cardiovascular:     Rate and Rhythm: Normal rate and regular rhythm.     Heart sounds: No murmur heard. Pulmonary:     Effort: Pulmonary effort is normal. No respiratory distress.     Breath sounds: Normal breath sounds.  Musculoskeletal:        General: Tenderness present. Normal range of motion.     Cervical back: Normal range of motion and neck supple.     Right lower leg: No edema.     Left lower leg: No edema.       Legs:     Comments: Second toe Left foot swollen and tender  10/10 with palpation   Skin:    General: Skin is warm and dry.  Neurological:     Mental Status: She is alert and oriented to person, place, and time.  Psychiatric:        Mood and Affect: Mood normal.        Behavior: Behavior normal.        Thought Content: Thought content normal.        Judgment: Judgment normal.      No results found for any visits on 05/12/24.  Last CBC Lab Results  Component Value Date   WBC 5.9 12/11/2018   HGB 13.0 12/11/2018   HCT 40.4 12/11/2018   MCV 75.2 (L) 12/11/2018   MCH 24.2 (L) 12/11/2018   RDW 15.2 12/11/2018   PLT 273 12/11/2018   Last metabolic panel Lab Results  Component Value Date   GLUCOSE 89 12/14/2023   NA 142 12/14/2023   K 4.1 12/14/2023   CL 105 12/14/2023   CO2 30 12/14/2023   BUN 13 12/14/2023   CREATININE 0.64 12/14/2023   GFR 93.15 12/14/2023   CALCIUM  9.2 12/14/2023   PHOS 4.1 11/03/2014   PROT 6.9 12/14/2023   ALBUMIN 4.4 12/14/2023   BILITOT 0.5 12/14/2023   ALKPHOS 89 12/14/2023   AST 16 12/14/2023   ALT 14 12/14/2023   ANIONGAP 8 12/11/2018   Last lipids Lab Results  Component Value Date   CHOL 202 (H) 12/14/2023   HDL 52.90  12/14/2023   LDLCALC 136 (H) 12/14/2023   TRIG 63.0 12/14/2023   CHOLHDL 4 12/14/2023   Last hemoglobin A1c Lab Results  Component Value Date   HGBA1C 5.9 02/22/2022   Last thyroid  functions Lab Results  Component Value Date   TSH 0.55 08/02/2016   Last vitamin D  No results found for: "25OHVITD2", "25OHVITD3", "VD25OH" Last vitamin B12 and Folate Lab Results  Component Value Date   VITAMINB12 635 02/22/2022   FOLATE 9.9 02/22/2022      The 10-year ASCVD risk score (Arnett DK, et al., 2019) is: 6.8%    Assessment & Plan:   Problem List Items Addressed This Visit   None Visit Diagnoses       Pain of toe of left foot    -  Primary   Relevant Orders   DG Toe 2nd Left     Assessment and Plan Assessment & Plan Toe Fracture   A suspected non-displaced fracture of the left toe occurred after she jammed it into a sofa. She heard a crack and experiences severe pain, rated 10 out of 10 when walking, localized to the toe and worsened by movement. No significant deformity is observed. Order an x-ray of the left toe to confirm the fracture and check for  displacement. Advise buddy taping the injured toe to the adjacent toe for support. Provide a hard bottom shoe or post-op boot to minimize toe movement and aid in walking. Consider referral to a specialist if the x-ray shows a displaced fracture.    No follow-ups on file.    Sie Formisano R Lowne Chase, DO

## 2024-07-29 ENCOUNTER — Other Ambulatory Visit: Payer: Self-pay | Admitting: Family

## 2024-07-29 ENCOUNTER — Other Ambulatory Visit (HOSPITAL_BASED_OUTPATIENT_CLINIC_OR_DEPARTMENT_OTHER): Payer: Self-pay

## 2024-07-29 DIAGNOSIS — I10 Essential (primary) hypertension: Secondary | ICD-10-CM

## 2024-07-29 MED ORDER — AMLODIPINE BESYLATE 5 MG PO TABS
7.5000 mg | ORAL_TABLET | Freq: Every day | ORAL | 0 refills | Status: DC
  Filled 2024-07-29: qty 135, 90d supply, fill #0

## 2024-07-29 NOTE — Telephone Encounter (Signed)
Please call patient to schedule a follow up appointment.

## 2024-12-02 ENCOUNTER — Other Ambulatory Visit (HOSPITAL_BASED_OUTPATIENT_CLINIC_OR_DEPARTMENT_OTHER): Payer: Self-pay | Admitting: Family

## 2024-12-02 DIAGNOSIS — Z1231 Encounter for screening mammogram for malignant neoplasm of breast: Secondary | ICD-10-CM

## 2024-12-06 ENCOUNTER — Ambulatory Visit (HOSPITAL_BASED_OUTPATIENT_CLINIC_OR_DEPARTMENT_OTHER)
Admission: RE | Admit: 2024-12-06 | Discharge: 2024-12-06 | Disposition: A | Discharge status: Home / Self Care | Source: Ambulatory Visit | Attending: Family | Admitting: Family

## 2024-12-06 DIAGNOSIS — Z1231 Encounter for screening mammogram for malignant neoplasm of breast: Secondary | ICD-10-CM | POA: Diagnosis present

## 2024-12-09 ENCOUNTER — Ambulatory Visit: Payer: Self-pay | Admitting: Family

## 2024-12-24 ENCOUNTER — Other Ambulatory Visit: Payer: Self-pay | Admitting: Family

## 2024-12-24 ENCOUNTER — Other Ambulatory Visit (HOSPITAL_BASED_OUTPATIENT_CLINIC_OR_DEPARTMENT_OTHER): Payer: Self-pay

## 2024-12-24 DIAGNOSIS — I10 Essential (primary) hypertension: Secondary | ICD-10-CM

## 2024-12-24 MED ORDER — AMLODIPINE BESYLATE 5 MG PO TABS
7.5000 mg | ORAL_TABLET | Freq: Every day | ORAL | 0 refills | Status: AC
  Filled 2024-12-24: qty 135, 90d supply, fill #0
